# Patient Record
Sex: Male | Born: 1965
Health system: Southern US, Community
[De-identification: ages and names within clinical notes are randomized; demographics above are authoritative.]

## PROBLEM LIST (undated history)

## (undated) DIAGNOSIS — I10 Essential (primary) hypertension: Secondary | ICD-10-CM

## (undated) DIAGNOSIS — F32A Depression, unspecified: Secondary | ICD-10-CM

## (undated) DIAGNOSIS — M2550 Pain in unspecified joint: Secondary | ICD-10-CM

## (undated) DIAGNOSIS — G5601 Carpal tunnel syndrome, right upper limb: Secondary | ICD-10-CM

## (undated) DIAGNOSIS — K219 Gastro-esophageal reflux disease without esophagitis: Secondary | ICD-10-CM

## (undated) DIAGNOSIS — F1010 Alcohol abuse, uncomplicated: Secondary | ICD-10-CM

## (undated) DIAGNOSIS — J45909 Unspecified asthma, uncomplicated: Secondary | ICD-10-CM

## (undated) DIAGNOSIS — E7800 Pure hypercholesterolemia, unspecified: Secondary | ICD-10-CM

## (undated) DIAGNOSIS — G4730 Sleep apnea, unspecified: Secondary | ICD-10-CM

## (undated) DIAGNOSIS — Z9289 Personal history of other medical treatment: Secondary | ICD-10-CM

## (undated) DIAGNOSIS — E119 Type 2 diabetes mellitus without complications: Secondary | ICD-10-CM

## (undated) DIAGNOSIS — F419 Anxiety disorder, unspecified: Secondary | ICD-10-CM

## (undated) DIAGNOSIS — M7989 Other specified soft tissue disorders: Secondary | ICD-10-CM

## (undated) DIAGNOSIS — G4733 Obstructive sleep apnea (adult) (pediatric): Secondary | ICD-10-CM

## (undated) DIAGNOSIS — Z8719 Personal history of other diseases of the digestive system: Secondary | ICD-10-CM

## (undated) DIAGNOSIS — Z8709 Personal history of other diseases of the respiratory system: Secondary | ICD-10-CM

## (undated) HISTORY — DX: Sleep apnea, unspecified: G47.30

## (undated) HISTORY — DX: Alcohol abuse, uncomplicated: F10.10

## (undated) HISTORY — DX: Pain in unspecified joint: M25.50

## (undated) HISTORY — DX: Type 2 diabetes mellitus without complications: E11.9

## (undated) HISTORY — DX: Anxiety disorder, unspecified: F41.9

## (undated) HISTORY — DX: Other specified soft tissue disorders: M79.89

## (undated) HISTORY — DX: Depression, unspecified: F32.A

## (undated) HISTORY — DX: Unspecified asthma, uncomplicated: J45.909

## (undated) HISTORY — DX: Pure hypercholesterolemia, unspecified: E78.00

## (undated) HISTORY — PX: TONSILLECTOMY: SUR1361

---

## 2010-01-06 ENCOUNTER — Ambulatory Visit: Payer: Self-pay | Admitting: Cardiology

## 2010-01-06 ENCOUNTER — Inpatient Hospital Stay (HOSPITAL_COMMUNITY): Admission: EM | Admit: 2010-01-06 | Discharge: 2010-01-07 | Payer: Self-pay | Admitting: Emergency Medicine

## 2010-01-07 ENCOUNTER — Encounter: Payer: Self-pay | Admitting: Cardiology

## 2010-01-27 ENCOUNTER — Ambulatory Visit: Payer: Self-pay | Admitting: Internal Medicine

## 2010-01-27 DIAGNOSIS — R1319 Other dysphagia: Secondary | ICD-10-CM | POA: Insufficient documentation

## 2010-01-27 DIAGNOSIS — K219 Gastro-esophageal reflux disease without esophagitis: Secondary | ICD-10-CM | POA: Insufficient documentation

## 2010-02-09 ENCOUNTER — Telehealth (INDEPENDENT_AMBULATORY_CARE_PROVIDER_SITE_OTHER): Payer: Self-pay

## 2010-02-10 ENCOUNTER — Telehealth (INDEPENDENT_AMBULATORY_CARE_PROVIDER_SITE_OTHER): Payer: Self-pay

## 2010-02-19 ENCOUNTER — Ambulatory Visit (HOSPITAL_COMMUNITY): Admission: RE | Admit: 2010-02-19 | Discharge: 2010-02-19 | Payer: Self-pay | Admitting: Internal Medicine

## 2010-02-19 ENCOUNTER — Ambulatory Visit: Payer: Self-pay | Admitting: Internal Medicine

## 2010-06-30 NOTE — Progress Notes (Signed)
Summary: phone note/ cancelled EGD for now  Phone Note Call from Patient   Caller: Patient Summary of Call: Pt called to cancel his EGD that was scheduled for 02/16/2010, said he has too much going on at work now. Will call back to  schedule in October. Informed Jules Schick in Endo. Initial call taken by: Cloria Spring LPN,  February 09, 2010 4:32 PM

## 2010-06-30 NOTE — Progress Notes (Signed)
Summary: pt rescheduled EGD  Phone Note Call from Patient   Caller: Patient Summary of Call: Pt called back, rescheduled his EGD for 02/19/2010 @ 1:30, to be at hospital at 12:30. Debbie at Endo aware. Initial call taken by: Cloria Spring LPN,  February 10, 2010 2:52 PM

## 2010-06-30 NOTE — Assessment & Plan Note (Signed)
Summary: HOSP F/U REFLUX/LAW   Visit Type:  Initial Visit Primary Care Provider:  [place]  Chief Complaint:  F/U hospital.  History of Present Illness: 45 year old gentleman sent over by Dr. Molly Maduro up in [place] to further evaluate recent intermittent atypical chest pain. Had a bout of rather severe retrosternal chest discomfort about  2 weeks ago for which she came to the hospital and was admitted.  Seen by Dr. Simona Huh with cardiology. He had some difficulty swallowing during this episode. He came to the hospital where he ruled out for an MI. Ultimately, it was felt that his chest pain was not coming from coronary artery disease. Some l chest wall/ shoulder pain along the way.  He apparently did have an exercise echo done. He has a history of GERD -  has been on a variety of proton pump inhibitors over the past couple years with variable success in controlling symptoms.  He does not have too much in the way of typical reflux symptoms such as  heartburn or regurgitation. He have some vague intermittent esophageal dysphagia from time to time but only fleeting. He denies nausea vomiting melena or hematochezia. No history of GI neoplasia in the family.  He denies tobacco or alcohol.  Current Medications (verified): 1)  Nexium 40 Mg Cpdr (Esomeprazole Magnesium) .... Take 1 Tablet By Mouth Once A Day 2)  Wellbutrin Sr 200 Mg Xr12h-Tab (Bupropion Hcl) .... Take 1 Tablet By Mouth Two Times A Day 3)  Multi-Vitamin .... Take 1 Tablet By Mouth Once A Day 4)  Fish Oil 1500 .... Take 1 Tablet By Mouth Once A Day 5)  Glucosamine .... Take 1 Tablet By Mouth Once A Day  Allergies (verified): 1)  ! Augmentin  Past History:  Family History: Last updated: 01/27/2010 Father: Living  age 105  diabetic  hypertension Mother: Living age 63   healthy Siblings: two sisters   healthy  Social History: Last updated: 01/27/2010 Marital Status: Married Children: 3 Occupation: Surveyor, minerals Heavy trucks and  equipment  Past Medical History:  GERD ANXIETY/DEPRESSION  Past Surgical History: NONE  Family History: Father: Living  age 51  diabetic  hypertension Mother: Living age 99   healthy Siblings: two sisters   healthy  Social History: Marital Status: Married Children: 3 Occupation: Surveyor, minerals Heavy trucks and equipment  Vital Signs:  Patient profile:   45 year old male Height:      71 inches Weight:      240.50 pounds BMI:     33.66 Temp:     98.6 degrees F oral Pulse rate:   80 / minute BP sitting:   126 / 88  (left arm) Cuff size:   regular  Vitals Entered By: Cloria Spring LPN (January 27, 2010 2:57 PM)  Physical Exam  General:  alert conversant no acute distress. Accompanied by  his wife Chest Wall:  slight tenderness to left low lower anterior chest wall to palpation Abdomen:  nondistended positive bowel sounds soft nontender and appreciable mass or organomegaly  Impression & Recommendations: Impression:  Very pleasant 45 year old gentleman with a history of gastroesophageal reflux disease and recent atypical chest pain not felt to be cardiac in origin. It seems that he does have some varying  degrees of chest pain off and on which have worsened over the past couple months. Certainly, not typical for GERD. He does describe transient esophageal dysphagia. He may have a pre-existing esophageal motility disorder. There may also be musculoskeletal component. Symptoms would be atypical  for peptic ulcer disease.  He is has never had his upper GI tract evaluated.  Recommendations: Diagnostic EGD in the very near future. Risks, benefits, limitations, alternatives and imponderables have been reviewed;  questions have been answered. He is agreeable. We'll make further recommendations after EGD has been performed.  I'd like to  thank Dr. Molly Maduro for his kind referral.  Appended Document: Orders Update    Clinical Lists Changes  Problems: Added new problem of DYSPHAGIA  (ICD-787.29) Added new problem of GERD (ICD-530.81) Orders: Added new Service order of Est. Patient Level III (09604) - Signed

## 2010-06-30 NOTE — Letter (Signed)
Summary: EGD ORDER  EGD ORDER   Imported By: Ave Filter 01/27/2010 15:38:04  _____________________________________________________________________  External Attachment:    Type:   Image     Comment:   External Document

## 2010-08-10 ENCOUNTER — Ambulatory Visit: Payer: 59 | Attending: Family Medicine

## 2010-08-10 DIAGNOSIS — G4733 Obstructive sleep apnea (adult) (pediatric): Secondary | ICD-10-CM | POA: Insufficient documentation

## 2010-08-10 DIAGNOSIS — R0989 Other specified symptoms and signs involving the circulatory and respiratory systems: Secondary | ICD-10-CM | POA: Insufficient documentation

## 2010-08-10 DIAGNOSIS — R5381 Other malaise: Secondary | ICD-10-CM | POA: Insufficient documentation

## 2010-08-10 DIAGNOSIS — R0609 Other forms of dyspnea: Secondary | ICD-10-CM | POA: Insufficient documentation

## 2010-08-10 DIAGNOSIS — R5383 Other fatigue: Secondary | ICD-10-CM | POA: Insufficient documentation

## 2010-08-12 ENCOUNTER — Encounter (INDEPENDENT_AMBULATORY_CARE_PROVIDER_SITE_OTHER): Payer: Self-pay | Admitting: *Deleted

## 2010-08-14 LAB — CBC
HCT: 42.3 % (ref 39.0–52.0)
Hemoglobin: 14.3 g/dL (ref 13.0–17.0)
MCH: 31.6 pg (ref 26.0–34.0)
MCH: 31.9 pg (ref 26.0–34.0)
MCHC: 34 g/dL (ref 30.0–36.0)
MCHC: 34.5 g/dL (ref 30.0–36.0)
MCV: 92.4 fL (ref 78.0–100.0)
Platelets: 207 10*3/uL (ref 150–400)
RBC: 4.58 MIL/uL (ref 4.22–5.81)
RDW: 12.4 % (ref 11.5–15.5)
WBC: 7.3 10*3/uL (ref 4.0–10.5)
WBC: 7.8 10*3/uL (ref 4.0–10.5)

## 2010-08-14 LAB — DIFFERENTIAL
Basophils Absolute: 0 10*3/uL (ref 0.0–0.1)
Basophils Relative: 1 % (ref 0–1)
Eosinophils Absolute: 0.2 10*3/uL (ref 0.0–0.7)
Eosinophils Relative: 3 % (ref 0–5)
Lymphs Abs: 2.2 10*3/uL (ref 0.7–4.0)
Monocytes Relative: 11 % (ref 3–12)
Monocytes Relative: 13 % — ABNORMAL HIGH (ref 3–12)
Neutrophils Relative %: 51 % (ref 43–77)

## 2010-08-14 LAB — BASIC METABOLIC PANEL
CO2: 26 mEq/L (ref 19–32)
GFR calc Af Amer: >60 mL/min (ref >60)

## 2010-08-14 LAB — COMPREHENSIVE METABOLIC PANEL
ALT: 49 U/L (ref 0–53)
AST: 30 U/L (ref 0–37)
Albumin: 4 g/dL (ref 3.5–5.2)
Alkaline Phosphatase: 72 U/L (ref 39–117)
BUN: 14 mg/dL (ref 6–23)
CO2: 26 mEq/L (ref 19–32)
Chloride: 106 mEq/L (ref 96–112)
Creatinine, Ser: 1 mg/dL (ref 0.4–1.5)
GFR calc Af Amer: >60 mL/min (ref >60)
GFR calc non Af Amer: >60 mL/min (ref >60)
Glucose, Bld: 117 mg/dL — ABNORMAL HIGH (ref 70–99)
Potassium: 3.8 mEq/L (ref 3.5–5.1)
Sodium: 139 mEq/L (ref 135–145)
Total Bilirubin: 0.4 mg/dL (ref 0.3–1.2)
Total Protein: 6.3 g/dL (ref 6.0–8.3)

## 2010-08-14 LAB — CARDIAC PANEL(CRET KIN+CKTOT+MB+TROPI)
CK, MB: 2.3 ng/mL (ref 0.3–4.0)
CK, MB: 2.5 ng/mL (ref 0.3–4.0)
CK, MB: 3 ng/mL (ref 0.3–4.0)
Relative Index: 2 (ref 0.0–2.5)
Total CK: 122 U/L (ref 7–232)
Total CK: 134 U/L (ref 7–232)
Troponin I: 0.01 ng/mL (ref 0.00–0.06)

## 2010-08-14 LAB — LIPID PANEL
Cholesterol: 168 mg/dL (ref 0–200)
Total CHOL/HDL Ratio: 5.1 RATIO
Triglycerides: 143 mg/dL (ref <150)
VLDL: 29 mg/dL (ref 0–40)

## 2010-08-14 LAB — POCT CARDIAC MARKERS

## 2010-08-18 NOTE — Letter (Signed)
Summary: Recall Office Visit  [facility]  [address]   [place], [place] [address]   Phone: [phone]  Fax: [phone]      August 12, 2010   [facility] [address] [place], [place]  [address] Oct 15, 1965   Dear Christopher Bowers,   According to our records, it is time for you to schedule a follow-up office visit with [place].   At your convenience, please call [phone] to schedule an office visit. If you have any questions, concerns, or feel that this letter is in error, we would appreciate your call.   Sincerely,    Diana Eves  [facility] Ph: [phone]   Fax: [phone]

## 2010-08-24 ENCOUNTER — Ambulatory Visit: Payer: 59 | Attending: Family Medicine

## 2010-09-15 ENCOUNTER — Ambulatory Visit: Payer: 59 | Attending: Family Medicine

## 2010-09-15 DIAGNOSIS — G4733 Obstructive sleep apnea (adult) (pediatric): Secondary | ICD-10-CM | POA: Insufficient documentation

## 2010-09-20 NOTE — Procedures (Signed)
[name], Christopher Bowers                  ACCOUNT NO.:  [id number]  MEDICAL RECORD NO.:  [id number]          PATIENT TYPE:  OUT  LOCATION:  SLEEP LAB                     FACILITY:  [facility]  PHYSICIAN:  Imogen Maddalena A. Gerilyn Pilgrim, M.D. DATE OF BIRTH:  13-Feb-1966  DATE OF STUDY:  09/15/2010                           NOCTURNAL POLYSOMNOGRAM  REFERRING PHYSICIAN:  Carlena Bjornstad [place]  REFERRING PHYSICIAN:  Carlena Bjornstad [place].  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  The patient was tried on conventional CPAP and BiPAP, but this was not successfully self-referred for servo-ventilation.  INDICATION FOR STUDY:  EPWORTH SLEEPINESS SCORE:  MEDICATIONS:  Benadryl.  EPWORTH SLEEPINESS SCALE: 1. BMI 32.  ARCHITECTURAL SUMMARY:  Total recording time is 411 minutes.  Sleep efficiency 65%, sleep latency 30 minutes.  REM latency 319 minutes. Stage N1 27%, N2 40%, N3 80%, and REM sleep 14%.  RESPIRATORY SUMMARY:  The patient was titrated on servo-ventilation using Respironics auto SV.  The following are the optimal pressures, maximum pressure of 25 cm water.  Maximum of EPAP 25 cm of water. Minimum EPAP 11 cm of water.  Maximum pressure support 30 cm of water. Minimum pressure support 0 cm of water.  Bi-Flex/2 with the rate set on auto.  LIMB MOVEMENT SUMMARY:  PLM index is 0.  ELECTROCARDIOGRAM SUMMARY:  Average heart rate 69 with no significant dysrhythmias observed.  IMPRESSION:  Complex sleep apnea syndrome which responded well to servo ventilation at optimal pressures using a Respironics auto SV with the following pressures, maximum pressure support 25 cm of water, maximum EPAP 25 cm of water, minimum EPAP 11 cm of water.  Maximum pressure support 13 cm of water, minimum suppression support is 0.  Bi-Flex/2 and the respiratory rate set on  automatic.  SLEEP ARCHITECTURE:  RESPIRATORY DATA:  OXYGEN DATA:  CARDIAC DATA:  MOVEMENT-PARASOMNIA:  IMPRESSIONS-RECOMMENDATIONS:     Alroy Portela A. Gerilyn Pilgrim, M.D. Electronically Signed 09/20/2010 21:20:28    KAD/MEDQ  D:  09/19/2010 21:14:19  T:  09/20/2010 01:11:00  Job:  [id number]

## 2012-07-07 ENCOUNTER — Other Ambulatory Visit: Payer: Self-pay | Admitting: Family Medicine

## 2012-07-07 ENCOUNTER — Ambulatory Visit (HOSPITAL_COMMUNITY)
Admission: RE | Admit: 2012-07-07 | Discharge: 2012-07-07 | Disposition: A | Discharge status: Home / Self Care | Payer: 59 | Source: Ambulatory Visit | Attending: Family Medicine | Admitting: Family Medicine

## 2012-07-07 DIAGNOSIS — R509 Fever, unspecified: Secondary | ICD-10-CM

## 2012-07-08 ENCOUNTER — Encounter (HOSPITAL_COMMUNITY): Payer: Self-pay

## 2012-07-08 ENCOUNTER — Emergency Department (HOSPITAL_COMMUNITY): Payer: 59

## 2012-07-08 ENCOUNTER — Inpatient Hospital Stay (HOSPITAL_COMMUNITY)
Admission: EM | Admit: 2012-07-08 | Discharge: 2012-07-19 | DRG: 207 | Disposition: A | Discharge status: Home / Self Care | Payer: 59 | Attending: Internal Medicine | Admitting: Internal Medicine

## 2012-07-08 DIAGNOSIS — J111 Influenza due to unidentified influenza virus with other respiratory manifestations: Secondary | ICD-10-CM

## 2012-07-08 DIAGNOSIS — J9600 Acute respiratory failure, unspecified whether with hypoxia or hypercapnia: Secondary | ICD-10-CM

## 2012-07-08 DIAGNOSIS — R1319 Other dysphagia: Secondary | ICD-10-CM

## 2012-07-08 DIAGNOSIS — K5900 Constipation, unspecified: Secondary | ICD-10-CM | POA: Diagnosis not present

## 2012-07-08 DIAGNOSIS — G4733 Obstructive sleep apnea (adult) (pediatric): Secondary | ICD-10-CM | POA: Diagnosis present

## 2012-07-08 DIAGNOSIS — I10 Essential (primary) hypertension: Secondary | ICD-10-CM

## 2012-07-08 DIAGNOSIS — G4700 Insomnia, unspecified: Secondary | ICD-10-CM | POA: Diagnosis not present

## 2012-07-08 DIAGNOSIS — E669 Obesity, unspecified: Secondary | ICD-10-CM

## 2012-07-08 DIAGNOSIS — D696 Thrombocytopenia, unspecified: Secondary | ICD-10-CM | POA: Diagnosis present

## 2012-07-08 DIAGNOSIS — Z79899 Other long term (current) drug therapy: Secondary | ICD-10-CM

## 2012-07-08 DIAGNOSIS — F411 Generalized anxiety disorder: Secondary | ICD-10-CM | POA: Diagnosis present

## 2012-07-08 DIAGNOSIS — J1100 Influenza due to unidentified influenza virus with unspecified type of pneumonia: Principal | ICD-10-CM | POA: Diagnosis present

## 2012-07-08 DIAGNOSIS — R55 Syncope and collapse: Secondary | ICD-10-CM | POA: Diagnosis present

## 2012-07-08 DIAGNOSIS — Z6834 Body mass index (BMI) 34.0-34.9, adult: Secondary | ICD-10-CM

## 2012-07-08 DIAGNOSIS — J189 Pneumonia, unspecified organism: Secondary | ICD-10-CM

## 2012-07-08 DIAGNOSIS — J101 Influenza due to other identified influenza virus with other respiratory manifestations: Secondary | ICD-10-CM

## 2012-07-08 DIAGNOSIS — K219 Gastro-esophageal reflux disease without esophagitis: Secondary | ICD-10-CM

## 2012-07-08 DIAGNOSIS — E871 Hypo-osmolality and hyponatremia: Secondary | ICD-10-CM | POA: Diagnosis not present

## 2012-07-08 DIAGNOSIS — R5381 Other malaise: Secondary | ICD-10-CM | POA: Diagnosis not present

## 2012-07-08 DIAGNOSIS — J80 Acute respiratory distress syndrome: Secondary | ICD-10-CM

## 2012-07-08 HISTORY — DX: Essential (primary) hypertension: I10

## 2012-07-08 HISTORY — DX: Obstructive sleep apnea (adult) (pediatric): G47.33

## 2012-07-08 LAB — URINALYSIS, ROUTINE W REFLEX MICROSCOPIC
Glucose, UA: NEGATIVE mg/dL
Leukocytes, UA: NEGATIVE
Protein, ur: 100 mg/dL — AB
Specific Gravity, Urine: >1.03 — ABNORMAL HIGH (ref 1.005–1.030)
Urobilinogen, UA: 1 mg/dL (ref 0.0–1.0)

## 2012-07-08 LAB — COMPREHENSIVE METABOLIC PANEL
AST: 53 U/L — ABNORMAL HIGH (ref 0–37)
Albumin: 2.9 g/dL — ABNORMAL LOW (ref 3.5–5.2)
BUN: 11 mg/dL (ref 6–23)
Calcium: 8.3 mg/dL — ABNORMAL LOW (ref 8.4–10.5)
Chloride: 100 mEq/L (ref 96–112)
Creatinine, Ser: 0.95 mg/dL (ref 0.50–1.35)
Total Bilirubin: 0.2 mg/dL — ABNORMAL LOW (ref 0.3–1.2)

## 2012-07-08 LAB — BLOOD GAS, ARTERIAL
Acid-base deficit: 2.8 mmol/L — ABNORMAL HIGH (ref 0.0–2.0)
Acid-base deficit: 2.3 mmol/L — ABNORMAL HIGH (ref 0.0–2.0)
Bicarbonate: 21.5 mEq/L (ref 20.0–24.0)
Bicarbonate: 22 mEq/L (ref 20.0–24.0)
TCO2: 19 mmol/L (ref 0–100)
TCO2: 19.6 mmol/L (ref 0–100)
pCO2 arterial: 38.6 mmHg (ref 35.0–45.0)
pCO2 arterial: 38.1 mmHg (ref 35.0–45.0)
pH, Arterial: 7.379 (ref 7.350–7.450)
pO2, Arterial: 111 mmHg — ABNORMAL HIGH (ref 80.0–100.0)

## 2012-07-08 LAB — LACTIC ACID, PLASMA: Lactic Acid, Venous: 1 mmol/L (ref 0.5–2.2)

## 2012-07-08 LAB — CBC WITH DIFFERENTIAL/PLATELET
Eosinophils Relative: 0 % (ref 0–5)
HCT: 41.1 % (ref 39.0–52.0)
Hemoglobin: 13.9 g/dL (ref 13.0–17.0)
Lymphocytes Relative: 7 % — ABNORMAL LOW (ref 12–46)
Lymphs Abs: 0.3 10*3/uL — ABNORMAL LOW (ref 0.7–4.0)
MCV: 90.1 fL (ref 78.0–100.0)
Monocytes Absolute: 0.1 10*3/uL (ref 0.1–1.0)
Monocytes Relative: 4 % (ref 3–12)
Neutro Abs: 3.3 10*3/uL (ref 1.7–7.7)
RBC: 4.56 MIL/uL (ref 4.22–5.81)
RDW: 12.6 % (ref 11.5–15.5)
WBC: 3.6 10*3/uL — ABNORMAL LOW (ref 4.0–10.5)

## 2012-07-08 LAB — INFLUENZA PANEL BY PCR (TYPE A & B): Influenza B By PCR: NEGATIVE

## 2012-07-08 LAB — URINE MICROSCOPIC-ADD ON

## 2012-07-08 LAB — HIV ANTIBODY (ROUTINE TESTING W REFLEX): HIV: NONREACTIVE

## 2012-07-08 MED ORDER — ONDANSETRON 4 MG PO TBDP
ORAL_TABLET | ORAL | Status: AC
  Administered 2012-07-08: 4 mg via ORAL
  Filled 2012-07-08: qty 1

## 2012-07-08 MED ORDER — DEXTROSE 5 % IV SOLN
1.0000 g | INTRAVENOUS | Status: DC
  Administered 2012-07-08 – 2012-07-09 (×2): 1 g via INTRAVENOUS
  Filled 2012-07-08 (×3): qty 10

## 2012-07-08 MED ORDER — ALBUTEROL SULFATE (5 MG/ML) 0.5% IN NEBU
2.5000 mg | INHALATION_SOLUTION | Freq: Once | RESPIRATORY_TRACT | Status: AC
  Administered 2012-07-08: 2.5 mg via RESPIRATORY_TRACT
  Filled 2012-07-08: qty 0.5

## 2012-07-08 MED ORDER — GUAIFENESIN-CODEINE 100-10 MG/5ML PO SOLN
10.0000 mL | Freq: Once | ORAL | Status: AC
  Administered 2012-07-08: 10 mL via ORAL
  Filled 2012-07-08: qty 10

## 2012-07-08 MED ORDER — HEPARIN SODIUM (PORCINE) 5000 UNIT/ML IJ SOLN
5000.0000 [IU] | Freq: Three times a day (TID) | INTRAMUSCULAR | Status: DC
  Administered 2012-07-08 – 2012-07-09 (×3): 5000 [IU] via SUBCUTANEOUS
  Filled 2012-07-08 (×7): qty 1

## 2012-07-08 MED ORDER — VANCOMYCIN HCL IN DEXTROSE 1-5 GM/200ML-% IV SOLN
1000.0000 mg | Freq: Two times a day (BID) | INTRAVENOUS | Status: DC
  Administered 2012-07-09 – 2012-07-11 (×5): 1000 mg via INTRAVENOUS
  Filled 2012-07-08 (×7): qty 200

## 2012-07-08 MED ORDER — OSELTAMIVIR PHOSPHATE 75 MG PO CAPS
75.0000 mg | ORAL_CAPSULE | Freq: Two times a day (BID) | ORAL | Status: DC
  Administered 2012-07-08: 75 mg via ORAL
  Filled 2012-07-08: qty 1

## 2012-07-08 MED ORDER — OXYCODONE HCL 5 MG PO TABS
5.0000 mg | ORAL_TABLET | ORAL | Status: DC | PRN
  Administered 2012-07-08 – 2012-07-09 (×4): 5 mg via ORAL
  Filled 2012-07-08 (×4): qty 1

## 2012-07-08 MED ORDER — DEXTROSE 5 % IV SOLN
500.0000 mg | INTRAVENOUS | Status: DC
  Administered 2012-07-08: 500 mg via INTRAVENOUS
  Filled 2012-07-08 (×2): qty 500

## 2012-07-08 MED ORDER — ACETAMINOPHEN 325 MG PO TABS
650.0000 mg | ORAL_TABLET | Freq: Four times a day (QID) | ORAL | Status: DC | PRN
  Administered 2012-07-08 – 2012-07-09 (×2): 650 mg via ORAL
  Filled 2012-07-08 (×2): qty 2

## 2012-07-08 MED ORDER — SODIUM CHLORIDE 0.9 % IV SOLN
1000.0000 mL | Freq: Once | INTRAVENOUS | Status: AC
  Administered 2012-07-08: 1000 mL via INTRAVENOUS

## 2012-07-08 MED ORDER — GUAIFENESIN-CODEINE 100-10 MG/5ML PO SOLN
10.0000 mL | ORAL | Status: DC | PRN
  Administered 2012-07-08: 10 mL via ORAL
  Filled 2012-07-08: qty 10

## 2012-07-08 MED ORDER — HYDROCODONE-ACETAMINOPHEN 5-325 MG PO TABS
1.0000 | ORAL_TABLET | Freq: Once | ORAL | Status: AC
  Administered 2012-07-08: 1 via ORAL
  Filled 2012-07-08: qty 1

## 2012-07-08 MED ORDER — SODIUM CHLORIDE 0.9 % IV SOLN
1000.0000 mL | INTRAVENOUS | Status: DC
  Administered 2012-07-08: 1000 mL via INTRAVENOUS

## 2012-07-08 MED ORDER — IPRATROPIUM BROMIDE 0.02 % IN SOLN
0.5000 mg | Freq: Once | RESPIRATORY_TRACT | Status: AC
  Administered 2012-07-08: 0.5 mg via RESPIRATORY_TRACT
  Filled 2012-07-08: qty 2.5

## 2012-07-08 MED ORDER — IPRATROPIUM BROMIDE 0.02 % IN SOLN
0.5000 mg | RESPIRATORY_TRACT | Status: DC
  Administered 2012-07-08 – 2012-07-09 (×4): 0.5 mg via RESPIRATORY_TRACT
  Filled 2012-07-08 (×4): qty 2.5

## 2012-07-08 MED ORDER — VANCOMYCIN HCL IN DEXTROSE 1-5 GM/200ML-% IV SOLN
INTRAVENOUS | Status: AC
  Filled 2012-07-08: qty 200

## 2012-07-08 MED ORDER — ONDANSETRON 4 MG PO TBDP
4.0000 mg | ORAL_TABLET | Freq: Once | ORAL | Status: AC
  Administered 2012-07-08: 4 mg via ORAL

## 2012-07-08 MED ORDER — DEXTROSE 5 % IV SOLN
500.0000 mg | INTRAVENOUS | Status: DC
  Filled 2012-07-08 (×2): qty 500

## 2012-07-08 MED ORDER — ALBUTEROL SULFATE (5 MG/ML) 0.5% IN NEBU
2.5000 mg | INHALATION_SOLUTION | RESPIRATORY_TRACT | Status: DC
  Administered 2012-07-08 – 2012-07-09 (×4): 2.5 mg via RESPIRATORY_TRACT
  Filled 2012-07-08 (×4): qty 0.5

## 2012-07-08 MED ORDER — VANCOMYCIN HCL IN DEXTROSE 1-5 GM/200ML-% IV SOLN
1000.0000 mg | INTRAVENOUS | Status: AC
  Administered 2012-07-08: 1000 mg via INTRAVENOUS
  Filled 2012-07-08 (×2): qty 200

## 2012-07-08 MED ORDER — ALBUTEROL SULFATE (5 MG/ML) 0.5% IN NEBU
2.5000 mg | INHALATION_SOLUTION | RESPIRATORY_TRACT | Status: DC | PRN
  Administered 2012-07-08: 2.5 mg via RESPIRATORY_TRACT
  Filled 2012-07-08: qty 0.5

## 2012-07-08 NOTE — Progress Notes (Signed)
ANTIBIOTIC CONSULT NOTE - INITIAL  Pharmacy Consult for Vancomycin Indication: pneumonia  Allergies  Allergen Reactions  . Amoxicillin-Pot Clavulanate Rash    Patient Measurements: Height: 5\' 11"  (180.3 cm) Weight: 245 lb (111.131 kg) IBW/kg (Calculated) : 75.3  Vital Signs: Temp: 102.6 F (39.2 C) (02/08 1630) Temp src: Oral (02/08 1630) BP: 146/97 mmHg (02/08 1700) Pulse Rate: 106 (02/08 1700) Intake/Output from previous day:   Intake/Output from this shift: Total I/O In: 589.6 [I.V.:339.6; IV Piggyback:250] Out: -   Labs:  Recent Labs  07/08/12 1325  WBC 3.6*  HGB 13.9  PLT 116*  CREATININE 0.95   Estimated Creatinine Clearance: 123.1 ml/min (by C-G formula based on Cr of 0.95). No results found for this basename: VANCOTROUGH, VANCOPEAK, VANCORANDOM, GENTTROUGH, GENTPEAK, GENTRANDOM, TOBRATROUGH, TOBRAPEAK, TOBRARND, AMIKACINPEAK, AMIKACINTROU, AMIKACIN,  in the last 72 hours   Microbiology: Recent Results (from the past 720 hour(s))  CULTURE, BLOOD (ROUTINE X 2)     Status: None   Collection Time    07/08/12  1:26 PM      Result Value Range Status   Specimen Description BLOOD LEFT FOREARM   Final   Special Requests BOTTLES DRAWN AEROBIC AND ANAEROBIC 4CC   Final   Culture PENDING   Incomplete   Report Status PENDING   Incomplete    Medical History: Past Medical History  Diagnosis Date  . Hypertension   . OSA (obstructive sleep apnea)   . Acid reflux     Medications:  Scheduled:  . [COMPLETED] sodium chloride  1,000 mL Intravenous Once  . [COMPLETED] albuterol  2.5 mg Nebulization Once  . albuterol  2.5 mg Nebulization Q4H  . azithromycin  500 mg Intravenous Q24H  . cefTRIAXone (ROCEPHIN)  IV  1 g Intravenous Q24H  . [COMPLETED] guaiFENesin-codeine  10 mL Oral Once  . heparin  5,000 Units Subcutaneous Q8H  . [COMPLETED] HYDROcodone-acetaminophen  1 tablet Oral Once  . [COMPLETED] ipratropium  0.5 mg Nebulization Once  . ipratropium  0.5 mg  Nebulization Q4H  . [COMPLETED] ondansetron  4 mg Oral Once  . oseltamivir  75 mg Oral BID  . [DISCONTINUED] azithromycin  500 mg Intravenous Q24H   Assessment: 47 yo obese M with flu and subsequent bilateral PNA which worsened on outpatient course of Levaquin.   Admitted for IV antibiotics with Rocephin, Zithromax & PNA. Renal function is at baseline.   Goal of Therapy:  Vancomycin trough level 15-20 mcg/ml  Plan:  1) Vancomycin 2gm IV load then 1gm IV q12h 2) Continue Rocephin & Zithromax per MD 3) Check Vancomycin trough at steady state 4) Monitor renal function and cx data 5) Duration of therapy per MD; anticipate 7-10 days of antibiotics  Elson Clan 07/08/2012,5:26 PM

## 2012-07-08 NOTE — ED Notes (Signed)
Requests medication for pain and medication for cough.

## 2012-07-08 NOTE — Progress Notes (Signed)
Patient has Oxygen saturations that started dropping. ABG drew, BiPAP started. Elink and attending MD notified. Attending to discuss with [facility] Physician about possible transfer to higher level of care.

## 2012-07-08 NOTE — H&P (Signed)
[facility] History and Physical  Christopher Bowers VHQ:[id number] DOB: Jun 23, 1965 DOA: 07/08/2012  Referring physician: Dr. Ignacia Palma, ER physician. PCP: Lilyan Punt, MD    Chief Complaint: Dyspnea, productive cough and fever.  HPI: Christopher Bowers is a 47 y.o. male who presents with dyspnea, productive cough and fever for the last 5 days or so. He started to initially have chills, fever and body aches as if he had influenza. Eventually he went to see his primary care physician a couple of days ago and was diagnosed with clinical flu. He was also started on Levaquin and he has had 2 doses of that. Unfortunately his breathing is gotten worse in the last 24 hours and he does not feel he is improving and therefore presented to the emergency room. He is a nonsmoker. He is saturating 95% on 2 L of oxygen. He has had poor by mouth intake, especially fluids. His chest x-ray is very abnormal and we are requested to see him for admission.   Review of Systems: Apart from history of present illness, other systems negative.  Past Medical History  Diagnosis Date  . Hypertension   . OSA (obstructive sleep apnea)   . Acid reflux    History reviewed. No pertinent past surgical history. Social History:  He is married and lives with his wife. He does not smoke, does  drink alcohol. He is in Airline pilot.  Allergies  Allergen Reactions  . Amoxicillin-Pot Clavulanate Rash    No family history on file.  In particular no family history of immunocompromise diseases.  Prior to Admission medications   Not on File   Physical Exam: Filed Vitals:   07/08/12 1355 07/08/12 1415 07/08/12 1431 07/08/12 1443  BP:  136/90  123/80  Pulse: 93 94 95 97  Temp:      TempSrc:      Resp: 30 24  22   SpO2: 94% 95% 93% 97%     General:  He looks sick, clinically dehydrated. There does not appear to be any central or peripheral cyanosis. He is not toxic clinically.  Eyes: No pallor. No jaundice.  ENT: No major  abnormalities.  Neck: No lymphadenopathy.  Cardiovascular: Heart sounds are present without murmurs or gallop rhythm. He is in sinus rhythm.  Respiratory: Bilateral crackles throughout both lung fields, currently no wheezing although he did have nebulizer treatment recently. There is no bronchial breathing.  Abdomen: Soft, nontender. No masses.  Skin: No rash.  Musculoskeletal: No major joint abnormalities.  Psychiatric: Appropriate affect.  Neurologic: Alert, orientated, no focal neurological signs.  Labs on Admission:  Basic Metabolic Panel:  Recent Labs Lab 07/08/12 1325  NA 133*  K 3.8  CL 100  CO2 25  GLUCOSE 157*  BUN 11  CREATININE 0.95  CALCIUM 8.3*   Liver Function Tests:  Recent Labs Lab 07/08/12 1325  AST 53*  ALT 44  ALKPHOS 73  BILITOT 0.2*  PROT 5.9*  ALBUMIN 2.9*     CBC:  Recent Labs Lab 07/08/12 1325  WBC 3.6*  NEUTROABS 3.3  HGB 13.9  HCT 41.1  MCV 90.1  PLT 116*     Radiological Exams on Admission: Dg Chest 2 View  07/08/2012  *RADIOLOGY REPORT*  Clinical Data: Shortness of breath, and chest pain  CHEST - 2 VIEW  Comparison: Chest x-ray of 07/07/2012  Findings: There has been an increase in patchy airspace disease bilaterally most consistent with worsening of multifocal pneumonia. Cardiomegaly is stable.  The lungs are poorly aerated.  No skeletal abnormality is seen.  IMPRESSION: Worsening of patchy airspace disease bilaterally most consistent with pneumonia.   Original Report Authenticated By: Dwyane Dee, M.D.    Dg Chest 2 View  07/07/2012  *RADIOLOGY REPORT*  Clinical Data: Fever.  Nausea.  CHEST - 2 VIEW  Comparison: 01/06/2010  Findings: The patient has bilateral patchy pulmonary infiltrates with prominent peribronchial thickening.  No effusions.  Heart size and vascularity are normal.  No acute osseous abnormality.  IMPRESSION: Bilateral pneumonia.   Original Report Authenticated By: Francene Boyers, M.D.     EKG:  Independently reviewed. Normal sinus rhythm, no acute ST-T wave changes.  Assessment/Plan   1. Bilateral community-acquired pneumonia with hypoxemia. Saturations above 90% on 2 L oxygen per minute. 2. Clinical influenza. 3. Hypertension. 4. Obesity.  Plan: 1. Admit to step down unit. 2. Intravenous antibiotics per pneumonia protocol. 3. Intravenous fluids. 4. Pulmonology consultation. I spoke with Dr. Juanetta Gosling already and he will come and see the patient today. 5. Closely monitor respiratory status and assist ventilation if required.   Code Status: Full code.  Family Communication: Discussed with patient at the bedside along with his wife.   Disposition Plan: Home when medically stable.  Time spent: 45 minutes.  Wilson Singer [facility] Pager [phone].  If 7PM-7AM, please contact night-coverage [email or web address] Password [email or web address] 07/08/2012, 3:07 PM

## 2012-07-08 NOTE — ED Provider Notes (Signed)
History     CSN: [id number]  Arrival date & time 07/08/12  1214   First MD Initiated Contact with Patient 07/08/12 1235      Chief Complaint  Patient presents with  . Shortness of Breath    (Consider location/radiation/quality/duration/timing/severity/associated sxs/prior treatment) HPI Comments: Patient is a 47 year old man  had the flu on Monday, 5 days ago. He seemed to get worse, and was seen by his family physician, Lilyan Punt, 2 days ago, who prescribed a Z-Pak. He continued to worsen, with fever, and was seen yesterday at that time Levaquin and Tamiflu were added. During the night he released and had temperature up to 102. He therefore called Dr. Gerda Diss, who referred him to [facility] ED for evaluation.  Patient is a 47 y.o. male presenting with cough. The history is provided by the patient and the spouse (Dr. Lilyan Punt). No language interpreter was used.  Cough Cough characteristics:  Productive Sputum characteristics: Pink-tinged sputum. Severity:  Severe Onset quality:  Gradual Duration:  5 days Timing:  Constant Progression:  Worsening Chronicity:  New Smoker: no   Relieved by:  Nothing Worsened by:  Nothing tried Ineffective treatments: He has take azithromycin, levofloxacin, and Tamiflu without relief. Associated symptoms: chills, fever, shortness of breath and wheezing   Fever:    Duration:  5 days   Timing:  Intermittent   Temp source:  Oral   Progression:  Worsening Shortness of breath:    Severity:  Moderate   Onset quality:  Gradual   Duration:  1 day   Timing:  Constant   Progression:  Worsening Wheezing:    Severity:  Moderate   Onset quality:  Gradual   Duration:  1 day   Timing:  Constant   Progression:  Worsening   Chronicity:  New   Past Medical History  Diagnosis Date  . Hypertension   . OSA (obstructive sleep apnea)   . Acid reflux     History reviewed. No pertinent past surgical history.  No family history on file.  History   Substance Use Topics  . Smoking status: Never Smoker   . Smokeless tobacco: Not on file  . Alcohol Use: No      Review of Systems  Constitutional: Positive for fever and chills.  HENT: Negative.   Eyes: Negative.   Respiratory: Positive for cough, shortness of breath and wheezing.   Cardiovascular: Negative.   Gastrointestinal: Negative.   Genitourinary: Negative.   Musculoskeletal: Negative.   Skin: Negative.   Allergic/Immunologic: Negative for immunocompromised state.  Neurological: Negative.   Psychiatric/Behavioral: Negative.     Allergies  Amoxicillin-pot clavulanate  Home Medications  No current outpatient prescriptions on file.  BP 136/82  Pulse 87  Temp(Src) 100.6 F (38.1 C) (Oral)  Resp 24  SpO2 93%  Physical Exam  Nursing note and vitals reviewed. Constitutional: He is oriented to person, place, and time. He appears well-developed and well-nourished.  In moderate distress with shortness of breath, cough.  HENT:  Head: Normocephalic and atraumatic.  Right Ear: External ear normal.  Left Ear: External ear normal.  Mouth/Throat: Oropharynx is clear and moist.  Eyes: Conjunctivae and EOM are normal. Pupils are equal, round, and reactive to light.  Neck: Normal range of motion. Neck supple.  Cardiovascular: Normal rate, regular rhythm and normal heart sounds.   Pulmonary/Chest:  Coarse rhonchi over both lung fields.  Abdominal: Soft. Bowel sounds are normal.  Musculoskeletal: Normal range of motion. He exhibits no edema and no  tenderness.  Neurological: He is alert and oriented to person, place, and time.  No sensory or motor discharge.  Skin: Skin is warm and dry.  Psychiatric: He has a normal mood and affect. His behavior is normal.    ED Course  Procedures (including critical care time)  Labs Reviewed - No data to display Dg Chest 2 View  07/07/2012  *RADIOLOGY REPORT*  Clinical Data: Fever.  Nausea.  CHEST - 2 VIEW  Comparison: 01/06/2010   Findings: The patient has bilateral patchy pulmonary infiltrates with prominent peribronchial thickening.  No effusions.  Heart size and vascularity are normal.  No acute osseous abnormality.  IMPRESSION: Bilateral pneumonia.   Original Report Authenticated By: Francene Boyers, M.D.    12:36 PM  Date: 07/08/2012  Rate: 91  Rhythm: normal sinus rhythm  QRS Axis: normal  Intervals: normal PQRS:  Left atrial abnormality.  Left ventricular hypertrophy.  ST/T Wave abnormalities: normal  Conduction Disutrbances:none  Narrative Interpretation: Abnormal EKG  Old EKG Reviewed: none available  12:47 PM Pt seen --> physical exam performed.  Lab workup ordered.  2:50 PM Results for orders placed during the hospital encounter of 07/08/12  CULTURE, BLOOD (ROUTINE X 2)      Result Value Range   Specimen Description BLOOD LEFT FOREARM     Special Requests BOTTLES DRAWN AEROBIC AND ANAEROBIC 4CC     Culture PENDING     Report Status PENDING    BLOOD GAS, ARTERIAL      Result Value Range   O2 Content 2.0     Delivery systems NASAL CANNULA     pH, Arterial 7.370  7.350 - 7.450   pCO2 arterial 38.6  35.0 - 45.0 mmHg   pO2, Arterial 74.4 (*) 80.0 - 100.0 mmHg   Bicarbonate 21.5  20.0 - 24.0 mEq/L   TCO2 19.0  0 - 100 mmol/L   Acid-base deficit 2.8 (*) 0.0 - 2.0 mmol/L   O2 Saturation 94.1     Patient temperature 38.1     Collection site LEFT RADIAL     Drawn by COLLECTED BY RT     Sample type ARTERIAL     Allens test (pass/fail) PASS  PASS  CBC WITH DIFFERENTIAL      Result Value Range   WBC 3.6 (*) 4.0 - 10.5 K/uL   RBC 4.56  4.22 - 5.81 MIL/uL   Hemoglobin 13.9  13.0 - 17.0 g/dL   HCT 96.0  45.4 - 09.8 %   MCV 90.1  78.0 - 100.0 fL   MCH 30.5  26.0 - 34.0 pg   MCHC 33.8  30.0 - 36.0 g/dL   RDW 11.9  14.7 - 82.9 %   Platelets 116 (*) 150 - 400 K/uL   Neutrophils Relative 89 (*) 43 - 77 %   Neutro Abs 3.3  1.7 - 7.7 K/uL   Lymphocytes Relative 7 (*) 12 - 46 %   Lymphs Abs 0.3 (*) 0.7  - 4.0 K/uL   Monocytes Relative 4  3 - 12 %   Monocytes Absolute 0.1  0.1 - 1.0 K/uL   Eosinophils Relative 0  0 - 5 %   Eosinophils Absolute 0.0  0.0 - 0.7 K/uL   Basophils Relative 0  0 - 1 %   Basophils Absolute 0.0  0.0 - 0.1 K/uL  COMPREHENSIVE METABOLIC PANEL      Result Value Range   Sodium 133 (*) 135 - 145 mEq/L   Potassium 3.8  3.5 -  5.1 mEq/L   Chloride 100  96 - 112 mEq/L   CO2 25  19 - 32 mEq/L   Glucose, Bld 157 (*) 70 - 99 mg/dL   BUN 11  6 - 23 mg/dL   Creatinine, Ser 9.60  0.50 - 1.35 mg/dL   Calcium 8.3 (*) 8.4 - 10.5 mg/dL   Total Protein 5.9 (*) 6.0 - 8.3 g/dL   Albumin 2.9 (*) 3.5 - 5.2 g/dL   AST 53 (*) 0 - 37 U/L   ALT 44  0 - 53 U/L   Alkaline Phosphatase 73  39 - 117 U/L   Total Bilirubin 0.2 (*) 0.3 - 1.2 mg/dL   GFR calc non Af Amer >90  >90 mL/min   GFR calc Af Amer >90  >90 mL/min  URINALYSIS, ROUTINE W REFLEX MICROSCOPIC      Result Value Range   Color, Urine YELLOW  YELLOW   APPearance CLEAR  CLEAR   Specific Gravity, Urine >1.030 (*) 1.005 - 1.030   pH 6.0  5.0 - 8.0   Glucose, UA NEGATIVE  NEGATIVE mg/dL   Hgb urine dipstick MODERATE (*) NEGATIVE   Bilirubin Urine NEGATIVE  NEGATIVE   Ketones, ur NEGATIVE  NEGATIVE mg/dL   Protein, ur 454 (*) NEGATIVE mg/dL   Urobilinogen, UA 1.0  0.0 - 1.0 mg/dL   Nitrite NEGATIVE  NEGATIVE   Leukocytes, UA NEGATIVE  NEGATIVE  LACTIC ACID, PLASMA      Result Value Range   Lactic Acid, Venous 1.0  0.5 - 2.2 mmol/L  URINE MICROSCOPIC-ADD ON      Result Value Range   RBC / HPF 3-6  <3 RBC/hpf   Dg Chest 2 View  07/08/2012  *RADIOLOGY REPORT*  Clinical Data: Shortness of breath, and chest pain  CHEST - 2 VIEW  Comparison: Chest x-ray of 07/07/2012  Findings: There has been an increase in patchy airspace disease bilaterally most consistent with worsening of multifocal pneumonia. Cardiomegaly is stable.  The lungs are poorly aerated.  No skeletal abnormality is seen.  IMPRESSION: Worsening of patchy airspace  disease bilaterally most consistent with pneumonia.   Original Report Authenticated By: Dwyane Dee, M.D.    Dg Chest 2 View  07/07/2012  *RADIOLOGY REPORT*  Clinical Data: Fever.  Nausea.  CHEST - 2 VIEW  Comparison: 01/06/2010  Findings: The patient has bilateral patchy pulmonary infiltrates with prominent peribronchial thickening.  No effusions.  Heart size and vascularity are normal.  No acute osseous abnormality.  IMPRESSION: Bilateral pneumonia.   Original Report Authenticated By: Francene Boyers, M.D.     2:50 PM Case discussed with Lilly Cove, M.D., [facility], who will see pt.   1. CAP (community acquired pneumonia)   2. HTN (hypertension)   3. Obesity          Carleene Cooper III, MD 07/08/12 [phone]

## 2012-07-08 NOTE — Consult Note (Signed)
Consult requested by: Dr. Karilyn Cota Consult requested for pneumonia:  HPI: This is a 47 year old who got sick about 5 days ago. He started off with shortness of breath productive cough and fever and also had chills and bodyaches. He eventually went to the physician on the sixth and was diagnosed with probable influenza but because he was pretty sick he was started on Levaquin. He went back to the primary care physician Dr. Gerda Diss and had a chest x-ray which showed bilateral pneumonia. Because he seems clinically stable he was continued on Levaquin and Zithromax was added. However he continued having problems with more shortness of breath and eventually came to the emergency room today and his x-ray looked worse than yesterday. He is admitted because of that. His white blood count was approximately 6000 yesterday and now is down to about 4000. His platelets were slightly low yesterday and today. His AST is slightly elevated which may be related to his pneumonia. He had an elevated ALT in January of 2011 but was back to normal in June of 2011. He says he's generally been active. He says he runs and occasionally has wheezing when he runs but no distinct history of asthma or any other lung problem. There is a family history of COPD. He has obstructive sleep apnea and admits he is noncompliant with CPAP  Past Medical History  Diagnosis Date  . Hypertension   . OSA (obstructive sleep apnea)   . Acid reflux      No family history on file.   History   Social History  . Marital Status: Married    Spouse Name: N/A    Number of Children: N/A  . Years of Education: N/A   Social History Main Topics  . Smoking status: Never Smoker   . Smokeless tobacco: None  . Alcohol Use: No  . Drug Use: No  . Sexually Active: None   Other Topics Concern  . None   Social History Narrative  . None     ROS: He says his abdomen is somewhat sore and he thinks it's related to cough and not eating. He is able to  cough up a little bit of sputum. He says he feels somewhat better after treatment in the emergency department.    Objective: Vital signs in last 24 hours: Temp:  [100.6 F (38.1 C)-102.6 F (39.2 C)] 102.6 F (39.2 C) (02/08 1630) Pulse Rate:  [87-100] 99 (02/08 1630) Resp:  [18-30] 28 (02/08 1630) BP: (117-141)/(68-90) 141/89 mmHg (02/08 1630) SpO2:  [88 %-99 %] 95 % (02/08 1630) Weight:  [111.131 kg (245 lb)] 111.131 kg (245 lb) (02/08 1640) Weight change:     Intake/Output from previous day:    PHYSICAL EXAM He is moderately obese. He does not appear to be in any acute distress. His pupils are reactive. His mucous membranes are slightly low. His neck is supple without masses. His chest shows some rales bilaterally and end expiratory wheezes. His heart is regular without gallop. His abdomen is soft without masses. Extremities showed no edema. Central nervous system examination is grossly intact  Lab Results: Basic Metabolic Panel:  Recent Labs  40/98/11 1325  NA 133*  K 3.8  CL 100  CO2 25  GLUCOSE 157*  BUN 11  CREATININE 0.95  CALCIUM 8.3*   Liver Function Tests:  Recent Labs  07/08/12 1325  AST 53*  ALT 44  ALKPHOS 73  BILITOT 0.2*  PROT 5.9*  ALBUMIN 2.9*   No results found  for this basename: LIPASE, AMYLASE,  in the last 72 hours No results found for this basename: AMMONIA,  in the last 72 hours CBC:  Recent Labs  07/08/12 1325  WBC 3.6*  NEUTROABS 3.3  HGB 13.9  HCT 41.1  MCV 90.1  PLT 116*   Cardiac Enzymes: No results found for this basename: CKTOTAL, CKMB, CKMBINDEX, TROPONINI,  in the last 72 hours BNP: No results found for this basename: PROBNP,  in the last 72 hours D-Dimer: No results found for this basename: DDIMER,  in the last 72 hours CBG: No results found for this basename: GLUCAP,  in the last 72 hours Hemoglobin A1C: No results found for this basename: HGBA1C,  in the last 72 hours Fasting Lipid Panel: No results found  for this basename: CHOL, HDL, LDLCALC, TRIG, CHOLHDL, LDLDIRECT,  in the last 72 hours Thyroid Function Tests: No results found for this basename: TSH, T4TOTAL, FREET4, T3FREE, THYROIDAB,  in the last 72 hours Anemia Panel: No results found for this basename: VITAMINB12, FOLATE, FERRITIN, TIBC, IRON, RETICCTPCT,  in the last 72 hours Coagulation: No results found for this basename: LABPROT, INR,  in the last 72 hours Urine Drug Screen: Drugs of Abuse  No results found for this basename: labopia, cocainscrnur, labbenz, amphetmu, thcu, labbarb    Alcohol Level: No results found for this basename: ETH,  in the last 72 hours Urinalysis:  Recent Labs  07/08/12 1306  COLORURINE YELLOW  LABSPEC >1.030*  PHURINE 6.0  GLUCOSEU NEGATIVE  HGBUR MODERATE*  BILIRUBINUR NEGATIVE  KETONESUR NEGATIVE  PROTEINUR 100*  UROBILINOGEN 1.0  NITRITE NEGATIVE  LEUKOCYTESUR NEGATIVE   Misc. Labs:   ABGS:  Recent Labs  07/08/12 1320  PHART 7.370  PO2ART 74.4*  TCO2 19.0  HCO3 21.5     MICROBIOLOGY: Recent Results (from the past 240 hour(s))  CULTURE, BLOOD (ROUTINE X 2)     Status: None   Collection Time    07/08/12  1:26 PM      Result Value Range Status   Specimen Description BLOOD LEFT FOREARM   Final   Special Requests BOTTLES DRAWN AEROBIC AND ANAEROBIC 4CC   Final   Culture PENDING   Incomplete   Report Status PENDING   Incomplete    Studies/Results: Dg Chest 2 View  07/08/2012  *RADIOLOGY REPORT*  Clinical Data: Shortness of breath, and chest pain  CHEST - 2 VIEW  Comparison: Chest x-ray of 07/07/2012  Findings: There has been an increase in patchy airspace disease bilaterally most consistent with worsening of multifocal pneumonia. Cardiomegaly is stable.  The lungs are poorly aerated.  No skeletal abnormality is seen.  IMPRESSION: Worsening of patchy airspace disease bilaterally most consistent with pneumonia.   Original Report Authenticated By: Dwyane Dee, M.D.    Dg Chest  2 View  07/07/2012  *RADIOLOGY REPORT*  Clinical Data: Fever.  Nausea.  CHEST - 2 VIEW  Comparison: 01/06/2010  Findings: The patient has bilateral patchy pulmonary infiltrates with prominent peribronchial thickening.  No effusions.  Heart size and vascularity are normal.  No acute osseous abnormality.  IMPRESSION: Bilateral pneumonia.   Original Report Authenticated By: Francene Boyers, M.D.     Medications:  Scheduled: . albuterol  2.5 mg Nebulization Q4H  . azithromycin  500 mg Intravenous Q24H  . cefTRIAXone (ROCEPHIN)  IV  1 g Intravenous Q24H  . heparin  5,000 Units Subcutaneous Q8H  . oseltamivir  75 mg Oral BID   Continuous: . sodium chloride 1,000 mL (  07/08/12 1700)   GEX:[id number], oxyCODONE  Assesment: He has bilateral community-acquired pneumonia which may be post-influenza. He is being treated with Tamiflu and is on Rocephin and Zithromax. I think we should add vancomycin because of the possibility that he has staphylococcal pneumonia. He has hypertension apparently well controlled. He has obesity. He has obstructive sleep apnea but is not taking history. His white blood count has gone down slightly he has mild thrombocytopenia which is chronic and his blood sugar is mildly elevated. His lactate is normal so I don't think he septic and his chest x-ray is worse which I think may be the natural progression as he mounts a response to pneumonia. Principal Problem:   Community acquired pneumonia Active Problems:   Obesity   HTN (hypertension)    Plan: I added vancomycin. I have discussed his situation with his wife parents and with Dr. Gerda Diss his primary physician and Dr. Karilyn Cota his hospital attending    LOS: 0 days   Lonnie Reth L 07/08/2012, 4:59 PM

## 2012-07-08 NOTE — Progress Notes (Addendum)
1610 Dr Karilyn Cota called and aware that pt is requesting something for a cough.  Droplet precautions discontinued, flu PCR negative. Md notified. Order received to d/c Tamiflu. Family at bedside and updated. Will continue to monitor.

## 2012-07-08 NOTE — ED Notes (Signed)
States his cough, fever started on Monday this past week.  States that it slowly progressed with shortness of breath.  States he is not getting any better.  Dr. Gerda Diss sent patient to  ER to be evaluated for no improvement after antibiotic therapy.

## 2012-07-08 NOTE — ED Notes (Signed)
Pt reports got sick Monday and was diagnosed with the Flu on Thursday. Pt started taking levaquin Thursday.  Was diagnosed with bilateral pneumonia Friday and z pack was added.  Today pt continues to have high fever and sob at rest.  Wife reports fevers have been as high as 102 despite taking advil and tylenol.  Denies pain.

## 2012-07-09 ENCOUNTER — Inpatient Hospital Stay (HOSPITAL_COMMUNITY): Payer: 59

## 2012-07-09 DIAGNOSIS — J111 Influenza due to unidentified influenza virus with other respiratory manifestations: Secondary | ICD-10-CM

## 2012-07-09 DIAGNOSIS — J189 Pneumonia, unspecified organism: Secondary | ICD-10-CM

## 2012-07-09 DIAGNOSIS — J9600 Acute respiratory failure, unspecified whether with hypoxia or hypercapnia: Secondary | ICD-10-CM

## 2012-07-09 DIAGNOSIS — R0902 Hypoxemia: Secondary | ICD-10-CM

## 2012-07-09 LAB — CK: Total CK: 749 U/L — ABNORMAL HIGH (ref 7–232)

## 2012-07-09 LAB — POCT I-STAT 3, ART BLOOD GAS (G3+)
Acid-Base Excess: 1 mmol/L (ref 0.0–2.0)
Acid-Base Excess: 1 mmol/L (ref 0.0–2.0)
Bicarbonate: 28.7 mEq/L — ABNORMAL HIGH (ref 20.0–24.0)
Bicarbonate: 25.5 meq/L — ABNORMAL HIGH (ref 20.0–24.0)
O2 Saturation: 100 %
O2 Saturation: 98 %
Patient temperature: 102.5
TCO2: 24 mmol/L (ref 0–100)
TCO2: 27 mmol/L (ref 0–100)
pCO2 arterial: 37.6 mmHg (ref 35.0–45.0)
pCO2 arterial: 42.3 mmHg (ref 35.0–45.0)
pH, Arterial: 7.4 (ref 7.350–7.450)
pH, Arterial: 7.396 (ref 7.350–7.450)
pO2, Arterial: 78 mmHg — ABNORMAL LOW (ref 80.0–100.0)
pO2, Arterial: 280 mmHg — ABNORMAL HIGH (ref 80.0–100.0)
pO2, Arterial: 108 mmHg — ABNORMAL HIGH (ref 80.0–100.0)

## 2012-07-09 LAB — CBC
Hemoglobin: 13.1 g/dL (ref 13.0–17.0)
MCHC: 33.8 g/dL (ref 30.0–36.0)
RDW: 12.7 % (ref 11.5–15.5)
WBC: 4.4 10*3/uL (ref 4.0–10.5)

## 2012-07-09 LAB — BASIC METABOLIC PANEL
BUN: 8 mg/dL (ref 6–23)
GFR calc Af Amer: >90 mL/min (ref >90)
GFR calc non Af Amer: 84 mL/min — ABNORMAL LOW (ref >90)
Potassium: 3.9 mEq/L (ref 3.5–5.1)

## 2012-07-09 LAB — LEGIONELLA ANTIGEN, URINE: Legionella Antigen, Urine: NEGATIVE

## 2012-07-09 LAB — GLUCOSE, CAPILLARY: Glucose-Capillary: 103 mg/dL — ABNORMAL HIGH (ref 70–99)

## 2012-07-09 MED ORDER — ROCURONIUM BROMIDE 50 MG/5ML IV SOLN
10.0000 mg | Freq: Once | INTRAVENOUS | Status: AC
  Administered 2012-07-09: 10 mg via INTRAVENOUS

## 2012-07-09 MED ORDER — FENTANYL CITRATE 0.05 MG/ML IJ SOLN
INTRAMUSCULAR | Status: AC
  Administered 2012-07-09: 200 ug
  Filled 2012-07-09: qty 4

## 2012-07-09 MED ORDER — OSELTAMIVIR PHOSPHATE 75 MG PO CAPS
75.0000 mg | ORAL_CAPSULE | Freq: Two times a day (BID) | ORAL | Status: DC
  Administered 2012-07-09 (×2): 75 mg via ORAL
  Filled 2012-07-09 (×3): qty 1

## 2012-07-09 MED ORDER — MIDAZOLAM HCL 2 MG/2ML IJ SOLN
INTRAMUSCULAR | Status: AC
  Administered 2012-07-09: 6 mg
  Filled 2012-07-09: qty 6

## 2012-07-09 MED ORDER — MIDAZOLAM BOLUS VIA INFUSION
1.0000 mg | INTRAVENOUS | Status: DC | PRN
  Filled 2012-07-09: qty 2

## 2012-07-09 MED ORDER — OSELTAMIVIR PHOSPHATE 6 MG/ML PO SUSR
75.0000 mg | Freq: Two times a day (BID) | ORAL | Status: DC
  Administered 2012-07-09 – 2012-07-10 (×2): 75 mg
  Filled 2012-07-09 (×3): qty 12.5

## 2012-07-09 MED ORDER — SODIUM CHLORIDE 0.9 % IV SOLN: INTRAVENOUS | Status: DC

## 2012-07-09 MED ORDER — CHLORHEXIDINE GLUCONATE 0.12 % MT SOLN
15.0000 mL | Freq: Two times a day (BID) | OROMUCOSAL | Status: DC
  Administered 2012-07-09 – 2012-07-15 (×12): 15 mL via OROMUCOSAL
  Filled 2012-07-09 (×12): qty 15

## 2012-07-09 MED ORDER — MIDAZOLAM HCL 5 MG/ML IJ SOLN
1.0000 mg/h | INTRAMUSCULAR | Status: DC
  Administered 2012-07-09: 2 mg/h via INTRAVENOUS
  Administered 2012-07-10: 1 mg/h via INTRAVENOUS
  Filled 2012-07-09 (×2): qty 10

## 2012-07-09 MED ORDER — HEPARIN SODIUM (PORCINE) 5000 UNIT/ML IJ SOLN
5000.0000 [IU] | Freq: Three times a day (TID) | INTRAMUSCULAR | Status: DC
  Administered 2012-07-09 – 2012-07-19 (×28): 5000 [IU] via SUBCUTANEOUS
  Filled 2012-07-09 (×32): qty 1

## 2012-07-09 MED ORDER — ALBUTEROL SULFATE HFA 108 (90 BASE) MCG/ACT IN AERS
6.0000 | INHALATION_SPRAY | Freq: Four times a day (QID) | RESPIRATORY_TRACT | Status: DC
  Administered 2012-07-09 – 2012-07-15 (×22): 6 via RESPIRATORY_TRACT
  Filled 2012-07-09 (×3): qty 6.7

## 2012-07-09 MED ORDER — ACETAMINOPHEN 160 MG/5ML PO SOLN
650.0000 mg | Freq: Four times a day (QID) | ORAL | Status: DC | PRN
  Administered 2012-07-09 – 2012-07-14 (×7): 650 mg
  Filled 2012-07-09 (×7): qty 20.3

## 2012-07-09 MED ORDER — BIOTENE DRY MOUTH MT LIQD
15.0000 mL | Freq: Four times a day (QID) | OROMUCOSAL | Status: DC
  Administered 2012-07-10 – 2012-07-15 (×23): 15 mL via OROMUCOSAL

## 2012-07-09 MED ORDER — BUPROPION HCL ER (SR) 100 MG PO TB12
200.0000 mg | ORAL_TABLET | Freq: Every day | ORAL | Status: DC
  Administered 2012-07-09: 200 mg via ORAL
  Filled 2012-07-09: qty 2

## 2012-07-09 MED ORDER — ALBUTEROL SULFATE HFA 108 (90 BASE) MCG/ACT IN AERS
6.0000 | INHALATION_SPRAY | RESPIRATORY_TRACT | Status: DC | PRN
  Administered 2012-07-11 – 2012-07-14 (×5): 6 via RESPIRATORY_TRACT
  Filled 2012-07-09: qty 6.7

## 2012-07-09 MED ORDER — LORAZEPAM 2 MG/ML IJ SOLN
1.0000 mg | Freq: Every evening | INTRAMUSCULAR | Status: DC | PRN
  Administered 2012-07-09 (×2): 1 mg via INTRAVENOUS
  Filled 2012-07-09 (×2): qty 1

## 2012-07-09 MED ORDER — PANTOPRAZOLE SODIUM 40 MG PO PACK
40.0000 mg | PACK | ORAL | Status: DC
  Administered 2012-07-09 – 2012-07-14 (×6): 40 mg
  Filled 2012-07-09 (×8): qty 20

## 2012-07-09 MED ORDER — PANTOPRAZOLE SODIUM 40 MG PO TBEC
40.0000 mg | DELAYED_RELEASE_TABLET | Freq: Every day | ORAL | Status: DC
  Administered 2012-07-09: 40 mg via ORAL
  Filled 2012-07-09: qty 1

## 2012-07-09 MED ORDER — FENTANYL CITRATE 0.05 MG/ML IJ SOLN
INTRAMUSCULAR | Status: AC
  Administered 2012-07-09: 100 ug
  Filled 2012-07-09: qty 4

## 2012-07-09 MED ORDER — DEXTROSE 5 % IV SOLN
500.0000 mg | INTRAVENOUS | Status: DC
  Administered 2012-07-09: 500 mg via INTRAVENOUS
  Filled 2012-07-09 (×2): qty 500

## 2012-07-09 MED ORDER — SODIUM CHLORIDE 0.9 % IV SOLN
25.0000 ug/h | INTRAVENOUS | Status: DC
  Administered 2012-07-09: 100 ug/h via INTRAVENOUS
  Filled 2012-07-09 (×2): qty 50

## 2012-07-09 MED ORDER — FENTANYL BOLUS VIA INFUSION
25.0000 ug | Freq: Four times a day (QID) | INTRAVENOUS | Status: DC | PRN
  Filled 2012-07-09: qty 100

## 2012-07-09 MED ORDER — MIDAZOLAM HCL 2 MG/2ML IJ SOLN
INTRAMUSCULAR | Status: AC
  Administered 2012-07-09: 2 mg
  Filled 2012-07-09: qty 4

## 2012-07-09 MED ORDER — ALBUTEROL SULFATE (5 MG/ML) 0.5% IN NEBU
2.5000 mg | INHALATION_SOLUTION | RESPIRATORY_TRACT | Status: DC | PRN
  Administered 2012-07-09 – 2012-07-16 (×2): 2.5 mg via RESPIRATORY_TRACT
  Filled 2012-07-09 (×2): qty 0.5

## 2012-07-09 MED ORDER — ETOMIDATE 2 MG/ML IV SOLN
20.0000 mg | Freq: Once | INTRAVENOUS | Status: AC
  Administered 2012-07-09: 20 mg via INTRAVENOUS

## 2012-07-09 MED ORDER — SUCCINYLCHOLINE CHLORIDE 20 MG/ML IJ SOLN
80.0000 mg | Freq: Once | INTRAMUSCULAR | Status: AC
  Administered 2012-07-09: 80 mg via INTRAVENOUS

## 2012-07-09 NOTE — Procedures (Signed)
Intubation Procedure Note Christopher Bowers SURGEON [id number] 1966-03-22  Procedure: Intubation Indications: Respiratory insufficiency  Procedure Details Consent: Risks of procedure as well as the alternatives and risks of each were explained to the (patient/caregiver).  Consent for procedure obtained. Time Out: Verified patient identification, verified procedure, site/side was marked, verified correct patient position, special equipment/implants available, medications/allergies/relevent history reviewed, required imaging and test results available.  Performed  Maximum sterile technique was used including gloves, hand hygiene and mask.  MAC and 3  Given 10 mg versed, 150 mcg fentanyl, 20 mg etomidate, 80 mg succinylcholine for procedure.    Very difficult anterior airway.    Evaluation Hemodynamic Status: Transient hypertension.t; O2 sats: transiently fell during during procedure Patient's Current Condition: stable Complications: No apparent complications Patient did tolerate procedure well. Chest X-ray ordered to verify placement.  CXR: pending.   Christopher Helling, MD [facility] Pulmonary/Critical Care 07/09/2012, 5:29 PM Pager:  [phone] After 3pm call: [phone]

## 2012-07-09 NOTE — Progress Notes (Signed)
ET tube advanced to 26 per MD

## 2012-07-09 NOTE — H&P (Signed)
Name: Christopher Bowers MRN: [id number] DOB: [birth year]    LOS: 1  PULMONARY / CRITICAL CARE MEDICINE  HPI:  Mr. Figgs is a 47 year-old gentleman admitted earlier today to [facility] for presumed influenza complicated by community acquired pneumonia.  He was hypoxic even on 6L, and was started on BiPAP and subsequently transferred to [facility] for further evaluation.  He reports symptoms starting Monday, with diffuse body aches, fever, which progressed through the week.  Past Medical History  Diagnosis Date  . Hypertension   . OSA (obstructive sleep apnea)   . Acid reflux    History reviewed. No pertinent past surgical history. Prior to Admission medications   Medication Sig Start Date End Date Taking? Authorizing Provider  buPROPion (WELLBUTRIN SR) 200 MG 12 hr tablet Take 200 mg by mouth daily.   Yes Historical Provider, MD  esomeprazole (NEXIUM) 40 MG capsule Take 40 mg by mouth daily before breakfast.   Yes Historical Provider, MD  lisinopril (PRINIVIL,ZESTRIL) 10 MG tablet Take 10 mg by mouth daily.   Yes Historical Provider, MD   Allergies Allergies  Allergen Reactions  . Amoxicillin-Pot Clavulanate Rash    Family History No family history on file. Social History  reports that he has never smoked. He does not have any smokeless tobacco history on file. He reports that he does not drink alcohol or use illicit drugs.  Review Of Systems:  All other systems were reviewed and were negative except as per HPI.  Brief patient description:  88 M with presumed flu + pneumonia vs. Mild ARDS  Events Since Admission: None  Current Status:  Vital Signs: Temp:  [99.5 F (37.5 C)-102.6 F (39.2 C)] 100.6 F (38.1 C) (02/09 0200) Pulse Rate:  [87-106] 95 (02/09 0230) Resp:  [18-34] 28 (02/09 0230) BP: (110-146)/(65-108) 130/100 mmHg (02/09 0230) SpO2:  [88 %-100 %] 98 % (02/09 0230) FiO2 (%):  [50 %] 50 % (02/09 0230) Weight:  [111.131 kg (245 lb)-113.9 kg (251 lb 1.7 oz)] 113.9 kg (251 lb  1.7 oz) (02/09 0131)  Physical Examination: General: No apparent distress. Eyes: Anicteric sclerae. ENT: Oropharynx clear. Moist mucous membranes. No thrush Lymph: No cervical, supraclavicular, or axillary lymphadenopathy. Heart: Normal S1, S2. No murmurs, rubs, or gallops appreciated. No bruits, equal pulses. Lungs: Normal excursion, no dullness to percussion. Good air movement bilaterally, without wheezes or crackles. Normal upper airway sounds without evidence of stridor. Abdomen: Abdomen soft, non-tender and not distended, normoactive bowel sounds. No hepatosplenomegaly or masses. Musculoskeletal: No clubbing or synovitis. Skin: No rashes or lesions Neuro: No focal neurologic deficits.  I have reviewed the labs and personally reviewed the radiology imaging since admission.  Ventilator Settings: Vent Mode:  [-]  FiO2 (%):  [50 %] 50 % CXR:  Bilateral infiltrates Cultures: Blood 2/8 >>> Sputum 2/8 >>> Antibiotics: Vanc 2/8 >>> Ceftriaxone 2/8 >>> Azithromycin 2/8 >>> Tamiflu 2/8 >>>  ASSESSMENT AND PLAN Principal Problem:   Community acquired pneumonia Active Problems:   Obesity   HTN (hypertension)   Influenza-like illness  Patient comfortable on BiPAP.  Will check CK given muscle soreness this far out from influenza to rule out influenza-myositis.  On appropriate antimicrobials, with currently stable respiratory effort.  Suspect will begin to improve with some rest tonight.  Lavella Hammock, M.D. Pulmonary and Critical Care Medicine [facility] Pager: [phone]  07/09/2012, 3:40 AM

## 2012-07-09 NOTE — Procedures (Signed)
Arterial Catheter Insertion Procedure Note LEIGH KAEDING [id number] 1966/02/25  Procedure: Insertion of Arterial Catheter  Indications: Blood pressure monitoring and Frequent blood sampling  Procedure Details Consent: Unable to obtain consent because of emergent medical necessity. Time Out: Verified patient identification, verified procedure, site/side was marked, verified correct patient position, special equipment/implants available, medications/allergies/relevent history reviewed, required imaging and test results available.  Performed  Maximum sterile technique was used including antiseptics, cap, gloves, gown, hand hygiene, mask and sheet. Skin prep: Chlorhexidine; local anesthetic administered 20 gauge catheter was inserted into right radial artery using the Seldinger technique.  Evaluation Blood flow good; BP tracing good. Complications: No apparent complications.   Tylene Fantasia 07/09/2012

## 2012-07-09 NOTE — Progress Notes (Signed)
PULMONARY  / CRITICAL CARE MEDICINE  Name: Christopher Bowers MRN: [id number] DOB: 1965-07-26    ADMISSION DATE:  07/08/2012 CONSULTATION DATE:  07/08/2012  REFERRING MD :  Lilly Cove  CHIEF COMPLAINT:  Short of breath  BRIEF PATIENT DESCRIPTION:  47 yo male present to [facility] on 2/8 with dyspnea, cough, and fever (Tm 102.6) with chills/aches  2nd to b/l PNA.  Failed to improved after outpt tx for levaquin for 2 days.  He was transferred to [facility] 2/9 for further tx.  SIGNIFICANT EVENTS / STUDIES:  2/9 Transfer to [facility]  LINES / TUBES: PIV  CULTURES: Blood 2/8 >>  Urine 2/8 >>  HIV 2/8 >> Non reactive Influenza 2/8 >> negative Pneumococcal Ag 2/8 >> negative Legionella Ag 2/8 >>   ANTIBIOTICS: Zithromax 2/8 >> Rocephin 2/8 >> Vancomycin 2/8 >> Tamiflu 2/8 >>   SUBJECTIVE:   VITAL SIGNS: Temp:  [99 F (37.2 C)-102.6 F (39.2 C)] 99 F (37.2 C) (02/09 0738) Pulse Rate:  [87-106] 92 (02/09 0700) Resp:  [18-34] 26 (02/09 0700) BP: (110-146)/(65-108) 135/83 mmHg (02/09 0700) SpO2:  [88 %-100 %] 99 % (02/09 0700) FiO2 (%):  [50 %-70 %] 70 % (02/09 0700) Weight:  [245 lb (111.131 kg)-251 lb 1.7 oz (113.9 kg)] 251 lb 1.7 oz (113.9 kg) (02/09 0131) HEMODYNAMICS:   VENTILATOR SETTINGS: Vent Mode:  [-]  FiO2 (%):  [50 %-70 %] 70 % INTAKE / OUTPUT: Intake/Output     02/08 0701 - 02/09 0700 02/09 0701 - 02/10 0700   P.O. 555    I.V. (mL/kg) 964.6 (8.5)    IV Piggyback 700    Total Intake(mL/kg) 2219.6 (19.5)    Urine (mL/kg/hr) 1950    Total Output 1950     Net +269.6            PHYSICAL EXAMINATION: General:  Ill appearing, speaks in full sentences Neuro:  Alert, normal strength, follows commands HEENT:  Wearing BiPAP mask Cardiovascular:  s1s2 regular, no murmur Lungs:  B/l rhochi Abdomen:  Soft, non tender Musculoskeletal:  No edema Skin:  No rashes  LABS:  Recent Labs Lab 07/08/12 1251 07/08/12 1320 07/08/12 1325 07/08/12 2335 07/09/12 0306 07/09/12 0542   HGB  --   --  13.9  --  13.1  --   WBC  --   --  3.6*  --  4.4  --   PLT  --   --  116*  --  122*  --   NA  --   --  133*  --  131*  --   K  --   --  3.8  --  3.9  --   CL  --   --  100  --  97  --   CO2  --   --  25  --  26  --   GLUCOSE  --   --  157*  --  113*  --   BUN  --   --  11  --  8  --   CREATININE  --   --  0.95  --  1.04  --   CALCIUM  --   --  8.3*  --  7.9*  --   AST  --   --  53*  --   --   --   ALT  --   --  44  --   --   --   ALKPHOS  --   --  73  --   --   --  BILITOT  --   --  0.2*  --   --   --   PROT  --   --  5.9*  --   --   --   ALBUMIN  --   --  2.9*  --   --   --   LATICACIDVEN 1.0  --   --   --   --   --   PHART  --  7.370  --  7.379  --  7.400  PCO2ART  --  38.6  --  38.1  --  37.6  PO2ART  --  74.4*  --  111.0*  --  78.0*    Recent Labs Lab 07/09/12 0205  GLUCAP 103*    Imaging: Dg Chest 2 View  07/08/2012  *RADIOLOGY REPORT*  Clinical Data: Shortness of breath, and chest pain  CHEST - 2 VIEW  Comparison: Chest x-ray of 07/07/2012  Findings: There has been an increase in patchy airspace disease bilaterally most consistent with worsening of multifocal pneumonia. Cardiomegaly is stable.  The lungs are poorly aerated.  No skeletal abnormality is seen.  IMPRESSION: Worsening of patchy airspace disease bilaterally most consistent with pneumonia.   Original Report Authenticated By: Dwyane Dee, M.D.    Dg Chest 2 View  07/07/2012  *RADIOLOGY REPORT*  Clinical Data: Fever.  Nausea.  CHEST - 2 VIEW  Comparison: 01/06/2010  Findings: The patient has bilateral patchy pulmonary infiltrates with prominent peribronchial thickening.  No effusions.  Heart size and vascularity are normal.  No acute osseous abnormality.  IMPRESSION: Bilateral pneumonia.   Original Report Authenticated By: Francene Boyers, M.D.    Dg Chest Port 1 View  07/09/2012  *RADIOLOGY REPORT*  Clinical Data: Pneumonia.  PORTABLE CHEST - 1 VIEW  Comparison: 07/08/2012  Findings: Shallow inspiration.   Airspace consolidation in both mid and lower lungs.  This appears to be increasing in density compared with the previous study, suggesting further volume loss.  Changes likely to represent multifocal pneumonia.  No blunting of costophrenic angles.  No pneumothorax.  Cardiac enlargement without pulmonary vascular congestion.  IMPRESSION: Increased volume loss and density of areas of consolidation in the mid and lower lungs bilaterally.  Progression of multifocal pneumonia is suggested.   Original Report Authenticated By: Burman Nieves, M.D.      ASSESSMENT / PLAN:  PULMONARY A: Acute respiratory failure 2nd to CAP. Hx of OSA. P:   F/u CXR Oxygen to keep SpO2 > 92% BiPAP as needed >> hopefully can avoid intubation  CARDIOVASCULAR A: Hx of HTN. P:  Hold lisinopril for now Monitor hemodynamics  RENAL A:  No active issues. P:   Monitor renal fx, urine outp  GASTROINTESTINAL A:  Nutrition. Hx of GERD. P:   Regular diet Protonix  HEMATOLOGIC A:  Thrombocytopenia - mild. P:  F/u CBC SQ heparin for DVT proph  INFECTIOUS A:  CAP >> Influenza negative, but still high risk for influenza. P:   Continue tamiflu for 5 days at least Continue zithromax, vancomycin, rocephin  ENDOCRINE A:  No issues.   P:   Monitor blood sugars on BMET  NEUROLOGIC A:  Anxiety. P:   Continue wellbutrin  CC time 45 minutes.  Updated wife at bedside (Respiratory therapist).  Coralyn Helling, MD [facility] Pulmonary/Critical Care 07/09/2012, 8:22 AM Pager:  [phone] After 3pm call: [phone]

## 2012-07-09 NOTE — Progress Notes (Signed)
Pt removed from BIPAP and placed on 5L nasal cannula per MD.  Pt tolerating well at this time.  RT will continue to monitor.

## 2012-07-09 NOTE — Progress Notes (Signed)
Vent changes made per ARDS protocol. PT now on 6cc.  RT will continue to monitor.

## 2012-07-09 NOTE — Progress Notes (Signed)
Report called to nurse on 2100 for report. [facility] present at bedside preparing for transfer. Dr. Orvan Falconer has just left bedside after assessing patient. Wife and other family leaving now for [facility].

## 2012-07-09 NOTE — Procedures (Signed)
Central Venous Catheter Insertion Procedure Note Christopher Bowers [id number] May 29, 1966  Procedure: Insertion of Central Venous Catheter Indications: Assessment of intravascular volume, Drug and/or fluid administration and Frequent blood sampling  Procedure Details Consent: Risks of procedure as well as the alternatives and risks of each were explained to the (patient/caregiver).  Consent for procedure obtained. Time Out: Verified patient identification, verified procedure, site/side was marked, verified correct patient position, special equipment/implants available, medications/allergies/relevent history reviewed, required imaging and test results available.  Performed  Maximum sterile technique was used including antiseptics, cap, gloves, gown, hand hygiene, mask and sheet. Skin prep: Chlorhexidine; local anesthetic administered A  triple lumen catheter was placed in the left internal jugular vein to 20 cm using the Seldinger technique.  Evaluation Blood flow good Complications: No apparent complications Patient did tolerate procedure well. Chest X-ray ordered to verify placement.  CXR: pending.   Procedure performed under direct supervision of Dr. Craige Cotta and with ultrasound guidance for real time vessel cannulation.     Canary Brim, NP-C [place] [facility] Pgr: [phone] or [phone]   Coralyn Helling, MD [facility] Pulmonary/Critical Care 07/09/2012, 6:27 PM Pager:  [phone] After 3pm call: [phone]

## 2012-07-10 ENCOUNTER — Inpatient Hospital Stay (HOSPITAL_COMMUNITY): Payer: 59

## 2012-07-10 DIAGNOSIS — K219 Gastro-esophageal reflux disease without esophagitis: Secondary | ICD-10-CM

## 2012-07-10 DIAGNOSIS — J9600 Acute respiratory failure, unspecified whether with hypoxia or hypercapnia: Secondary | ICD-10-CM

## 2012-07-10 LAB — POCT I-STAT 3, ART BLOOD GAS (G3+)
Acid-Base Excess: 3 mmol/L — ABNORMAL HIGH (ref 0.0–2.0)
Bicarbonate: 28.7 mEq/L — ABNORMAL HIGH (ref 20.0–24.0)
O2 Saturation: 95 %
O2 Saturation: 95 %
O2 Saturation: 92 %
O2 Saturation: 90 %
Patient temperature: 101.6
TCO2: 30 mmol/L (ref 0–100)
TCO2: 30 mmol/L (ref 0–100)
TCO2: 30 mmol/L (ref 0–100)
pCO2 arterial: 50.1 mmHg — ABNORMAL HIGH (ref 35.0–45.0)
pCO2 arterial: 53.1 mmHg — ABNORMAL HIGH (ref 35.0–45.0)
pCO2 arterial: 47.2 mmHg — ABNORMAL HIGH (ref 35.0–45.0)
pCO2 arterial: 56.7 mmHg — ABNORMAL HIGH (ref 35.0–45.0)
pO2, Arterial: 90 mmHg (ref 80.0–100.0)
pO2, Arterial: 92 mmHg (ref 80.0–100.0)
pO2, Arterial: 64 mmHg — ABNORMAL LOW (ref 80.0–100.0)

## 2012-07-10 LAB — BASIC METABOLIC PANEL
Chloride: 99 mEq/L (ref 96–112)
GFR calc Af Amer: >90 mL/min (ref >90)
GFR calc non Af Amer: 86 mL/min — ABNORMAL LOW (ref >90)
Potassium: 3.9 mEq/L (ref 3.5–5.1)
Sodium: 136 mEq/L (ref 135–145)

## 2012-07-10 LAB — CBC
Hemoglobin: 12.6 g/dL — ABNORMAL LOW (ref 13.0–17.0)
RBC: 4.16 MIL/uL — ABNORMAL LOW (ref 4.22–5.81)

## 2012-07-10 MED ORDER — FENTANYL BOLUS VIA INFUSION
25.0000 ug | Freq: Four times a day (QID) | INTRAVENOUS | Status: DC | PRN
  Filled 2012-07-10: qty 100

## 2012-07-10 MED ORDER — SODIUM CHLORIDE 0.9 % IV SOLN
25.0000 ug/h | INTRAVENOUS | Status: DC
  Administered 2012-07-10 (×2): 100 ug/h via INTRAVENOUS
  Administered 2012-07-11: 150 ug/h via INTRAVENOUS
  Filled 2012-07-10 (×3): qty 50

## 2012-07-10 MED ORDER — OXEPA PO LIQD
1000.0000 mL | ORAL | Status: DC
  Administered 2012-07-10 – 2012-07-14 (×5): 1000 mL
  Filled 2012-07-10 (×8): qty 1000

## 2012-07-10 MED ORDER — HALOPERIDOL LACTATE 5 MG/ML IJ SOLN: 2.0000 mg | Freq: Once | INTRAMUSCULAR | Status: DC

## 2012-07-10 MED ORDER — DEXTROSE 5 % IV SOLN
1.0000 g | Freq: Three times a day (TID) | INTRAVENOUS | Status: DC
  Administered 2012-07-10 – 2012-07-13 (×9): 1 g via INTRAVENOUS
  Filled 2012-07-10 (×11): qty 1

## 2012-07-10 MED ORDER — BUPROPION HCL ER (SR) 100 MG PO TB12
200.0000 mg | ORAL_TABLET | Freq: Every day | ORAL | Status: DC
  Filled 2012-07-10: qty 2

## 2012-07-10 MED ORDER — PRO-STAT SUGAR FREE PO LIQD
60.0000 mL | Freq: Four times a day (QID) | ORAL | Status: DC
  Administered 2012-07-10 – 2012-07-14 (×19): 60 mL
  Filled 2012-07-10 (×23): qty 60

## 2012-07-10 MED ORDER — FUROSEMIDE 10 MG/ML IJ SOLN
20.0000 mg | Freq: Two times a day (BID) | INTRAMUSCULAR | Status: AC
  Administered 2012-07-10 – 2012-07-14 (×9): 20 mg via INTRAVENOUS
  Filled 2012-07-10 (×11): qty 2

## 2012-07-10 MED ORDER — CEFTAZIDIME 1 G IJ SOLR
1.0000 g | Freq: Three times a day (TID) | INTRAMUSCULAR | Status: DC
  Filled 2012-07-10 (×3): qty 1

## 2012-07-10 MED ORDER — OXEPA PO LIQD
1000.0000 mL | ORAL | Status: DC
  Filled 2012-07-10 (×2): qty 1000

## 2012-07-10 MED ORDER — LEVOFLOXACIN IN D5W 750 MG/150ML IV SOLN
750.0000 mg | INTRAVENOUS | Status: DC
  Administered 2012-07-10 – 2012-07-11 (×2): 750 mg via INTRAVENOUS
  Filled 2012-07-10 (×4): qty 150

## 2012-07-10 MED ORDER — LORAZEPAM 2 MG/ML IJ SOLN: 1.0000 mg | Freq: Once | INTRAMUSCULAR | Status: DC

## 2012-07-10 MED ORDER — PROPOFOL 10 MG/ML IV EMUL
5.0000 ug/kg/min | INTRAVENOUS | Status: DC
  Administered 2012-07-10: 25 ug/kg/min via INTRAVENOUS
  Administered 2012-07-10: 10 ug/kg/min via INTRAVENOUS
  Administered 2012-07-10: 30 ug/kg/min via INTRAVENOUS
  Administered 2012-07-11: 20 ug/kg/min via INTRAVENOUS
  Administered 2012-07-11: 30 ug/kg/min via INTRAVENOUS
  Administered 2012-07-11: 29.953 ug/kg/min via INTRAVENOUS
  Administered 2012-07-12: 15 ug/kg/min via INTRAVENOUS
  Administered 2012-07-12: 20 ug/kg/min via INTRAVENOUS
  Filled 2012-07-10 (×8): qty 100

## 2012-07-10 MED ORDER — OSELTAMIVIR PHOSPHATE 6 MG/ML PO SUSR
150.0000 mg | Freq: Two times a day (BID) | ORAL | Status: DC
  Administered 2012-07-10 – 2012-07-14 (×9): 150 mg
  Filled 2012-07-10 (×11): qty 25

## 2012-07-10 NOTE — Progress Notes (Addendum)
Agitation and anxiety   Ativan ordered   Ordered by mistake

## 2012-07-10 NOTE — Care Management Note (Signed)
  Page 2 of 2   07/18/2012     4:05:31 PM   CARE MANAGEMENT NOTE 07/18/2012  Patient:  [facility]   Account Number:  [id number]  Date Initiated:  07/10/2012  Documentation initiated by:  [facility]  Subjective/Objective Assessment:   resp failure - requiring intubation, on 07-09-12.     Action/Plan:   Anticipated DC Date:  07/19/2012   Anticipated DC Plan:  HOME W HOME HEALTH SERVICES      DC Planning Services  CM consult      Choice offered to / List presented to:  C-1 Patient   DME arranged  Levan Hurst      DME agency  [facility]     HH arranged  HH-2 PT      HH agency  [facility]   Status of service:  Completed, signed off Medicare Important Message given?   (If response is "NO", the following Medicare IM given date fields will be blank) Date Medicare IM given:   Date Additional Medicare IM given:    Discharge Disposition:  HOME W HOME HEALTH SERVICES  Per UR Regulation:  Reviewed for med. necessity/level of care/duration of stay  If discussed at Long Length of Stay Meetings, dates discussed:    Comments:  Contact:  Farinas,Christy Spouse [phone]  07-18-12 Patient's wife wanted [facility] . Confirmed with [facility] they do not service the [place] area .  Patient's wife 's second choice was [facility] , however, they do not accept patient's insurance.  Patient's wife's third choice is [facility] [phone] .  [facility] [phone] , accepted patient . Information faxed to [phone]  Ronny Flurry RN BSN [phone]    07-18-12 Plan to discharge patient to home tomorrow 07-19-12 with home health PT . Confirmed patient's face sheet information with him . Wife Neysa Bonito 's cell phone is [phone] , patient's cell phone is [phone] .  Left list of home health agencies for [place] area with patient .  His wife will be back in a hour . He will  discuss home health with his wife . Left my contact information with patient . Will follow up with patient .   Ronny Flurry RN BSN [phone]    07-12-12 8:15am Avie Arenas RNBS - [phone] Remains on vent - CIR looking at for possible candidate once extubated.  Walking in [place] on vent - sitting up in chair.  07-10-12 12noon Avie Arenas, RNBSN - [phone] [facility] following.

## 2012-07-10 NOTE — Procedures (Signed)
Bronchoscopy Procedure Note AVIV LENGACHER [id number] 1965-06-13  Procedure: Bronchoscopy Indications: Diagnostic evaluation of the airways and Obtain specimens for culture and/or other diagnostic studies  Procedure Details Consent: Risks of procedure as well as the alternatives and risks of each were explained to the (patient/caregiver).  Consent for procedure obtained. Time Out: Verified patient identification, verified procedure, site/side was marked, verified correct patient position, special equipment/implants available, medications/allergies/relevent history reviewed, required imaging and test results available.  Performed  In preparation for procedure, patient was given 100% FiO2 and bronchoscope lubricated. Sedation: prop  Airway entered and the following bronchi were examined: RUL, RML, RLL, LUL, LLL and Bronchi.   Procedures performed: Brushings no Bronchoscope removed.  , Patient placed back on 100% FiO2 at conclusion of procedure.    Evaluation Hemodynamic Status: BP stable throughout; O2 sats: stable throughout Patient's Current Condition: stable Specimens:  Sent purulent fluid Complications: No apparent complications Patient did tolerate procedure well.   Nelda Bucks. 07/10/2012  1. Diffuse erythema, pus all lobes 2. n collpase 3. No endobronchial lesions 4. BAL rml,   Mcarthur Rossetti. Tyson Alias, MD, FACP Pgr: [phone] [place] [facility]

## 2012-07-10 NOTE — Progress Notes (Signed)
PULMONARY  / CRITICAL CARE MEDICINE  Name: SHAILEN THIELEN MRN: [id number] DOB: [birth year]    ADMISSION DATE:  07/08/2012 CONSULTATION DATE:  07/08/2012  REFERRING MD :  Lilly Cove  CHIEF COMPLAINT:  Short of breath  BRIEF PATIENT DESCRIPTION:  47 yo male present to [facility] on 2/8 with dyspnea, cough, and fever (Tm 102.6) with chills/aches  2nd to b/l PNA.  Failed to improved after outpt tx for levaquin for 2 days.  He was transferred to [facility] 2/9 for further tx.  SIGNIFICANT EVENTS / STUDIES:  2/9 Transfer to [facility] 2/9- intubation  LINES / TUBES: ETT 2/9 >> R arterial line 2/9 >> L ij cvc 2/9 >> PIV  CULTURES: Blood 2/8 >>  Urine 2/8 >>  HIV 2/8 >> Non reactive Influenza 2/8 >> negative Pneumococcal Ag 2/8 >> negative Legionella Ag 2/8 >> negative Resp viral panel 2/9 >>  ANTIBIOTICS: Zithromax 2/8 >> Rocephin 2/8 >> Vancomycin 2/8 >> Tamiflu 2/8 >>   SUBJECTIVE:  Wife reports a mostly restful night.  Will sometimes wake up a little and try to tug at ETT.    VITAL SIGNS: Temp:  [99.2 F (37.3 C)-103 F (39.4 C)] 99.3 F (37.4 C) (02/10 0812) Pulse Rate:  [53-104] 93 (02/10 0800) Resp:  [16-36] 32 (02/10 0800) BP: (95-217)/(49-129) 120/78 mmHg (02/10 0800) SpO2:  [78 %-99 %] 95 % (02/10 0800) FiO2 (%):  [0 %-100 %] 50 % (02/10 0800) Weight:  [247 lb 12.8 oz (112.4 kg)] 247 lb 12.8 oz (112.4 kg) (02/10 0600) HEMODYNAMICS:   VENTILATOR SETTINGS: Vent Mode:  [-] PRVC FiO2 (%):  [0 %-100 %] 50 % Set Rate:  [26 bmp-32 bmp] 32 bmp Vt Set:  [450 mL-600 mL] 450 mL PEEP:  [8 cmH20-10 cmH20] 8 cmH20 Plateau Pressure:  [19 cmH20-29 cmH20] 19 cmH20 INTAKE / OUTPUT: Intake/Output     02/09 0701 - 02/10 0700 02/10 0701 - 02/11 0700   P.O. 480    I.V. (mL/kg) 456.3 (4.1) 12 (0.1)   NG/GT 170    IV Piggyback 500    Total Intake(mL/kg) 1606.3 (14.3) 12 (0.1)   Urine (mL/kg/hr) 2465 (0.9) 125 (0.9)   Emesis/NG output 100 (0)    Total Output 2565 125   Net -958.7 -113.1           PHYSICAL EXAMINATION: General: sedated, mittens in place Neuro:  sedated HEENT:  ETT in place Cardiovascular:  s1s2 regular, no murmur Lungs:  B/l rhochi; rales in left base Abdomen:  Soft, non tender Musculoskeletal:  No edema Skin:  No rashes  LABS:  Recent Labs Lab 07/08/12 1251  07/08/12 1325  07/09/12 0306  07/09/12 1921 07/10/12 0400 07/10/12 0424 07/10/12 0756  HGB  --   --  13.9  --  13.1  --   --  12.6*  --   --   WBC  --   --  3.6*  --  4.4  --   --  5.5  --   --   PLT  --   --  116*  --  122*  --   --  163  --   --   NA  --   --  133*  --  131*  --   --  136  --   --   K  --   < > 3.8  --  3.9  --   --  3.9  --   --   CL  --   --  100  --  97  --   --  99  --   --   CO2  --   --  25  --  26  --   --  29  --   --   GLUCOSE  --   --  157*  --  113*  --   --  117*  --   --   BUN  --   --  11  --  8  --   --  12  --   --   CREATININE  --   --  0.95  --  1.04  --   --  1.02  --   --   CALCIUM  --   --  8.3*  --  7.9*  --   --  8.2*  --   --   AST  --   --  53*  --   --   --   --   --   --   --   ALT  --   --  44  --   --   --   --   --   --   --   ALKPHOS  --   --  73  --   --   --   --   --   --   --   BILITOT  --   --  0.2*  --   --   --   --   --   --   --   PROT  --   --  5.9*  --   --   --   --   --   --   --   ALBUMIN  --   --  2.9*  --   --   --   --   --   --   --   LATICACIDVEN 1.0  --   --   --   --   --   --   --   --   --   PHART  --   < >  --   < >  --   < > 7.396  --  7.373 7.345*  PCO2ART  --   < >  --   < >  --   < > 42.3  --  50.1* 53.1*  PO2ART  --   < >  --   < >  --   < > 108.0*  --  90.0 92.0  < > = values in this interval not displayed.  Recent Labs Lab 07/09/12 0205  GLUCAP 103*    Imaging: Dg Chest 2 View  07/08/2012  *RADIOLOGY REPORT*  Clinical Data: Shortness of breath, and chest pain  CHEST - 2 VIEW  Comparison: Chest x-ray of 07/07/2012  Findings: There has been an increase in patchy airspace disease bilaterally most  consistent with worsening of multifocal pneumonia. Cardiomegaly is stable.  The lungs are poorly aerated.  No skeletal abnormality is seen.  IMPRESSION: Worsening of patchy airspace disease bilaterally most consistent with pneumonia.   Original Report Authenticated By: Dwyane Dee, M.D.    Dg Chest Port 1 View  07/10/2012  *RADIOLOGY REPORT*  Clinical Data: Pneumonia, evaluate endotracheal tube positioning  PORTABLE CHEST - 1 VIEW  Comparison: 07/09/2012; 07/07/2012; 01/06/2010  Findings: Interval advancement of endotracheal tube with tip overlying tracheal air column approximately 2.1 cm above the carina.  Interval placement of a  enteric tube with tip inside port projecting below the left hemidiaphragm.  Stable positioning of left jugular approach venous catheter with tip projected over the superior SVC. Minimal increase in extensive bilateral heterogeneous air space opacities, worse within the right upper and perihilar lung as well as within the peripheral aspect of the left mid lung. No definite pneumothorax or pleural effusion.  Unchanged bones.  IMPRESSION: 1.  Interval placement of enteric tube with tip and sideport projecting below the left hemidiaphragm. 2.  Remaining support apparatus appropriately positioned as above. No pneumothorax. 3.  Suspected worsening of bilateral air space opacities compatible with progression of of focal infection.   Original Report Authenticated By: Tacey Ruiz, MD    Dg Chest Port 1 View  07/09/2012  *RADIOLOGY REPORT*  Clinical Data: Endotracheal tube placement.  Central line placement.  Obstructive sleep apnea.  Hypertension.  PORTABLE CHEST - 1 VIEW  Comparison: 07/09/2012  Findings: Endotracheal tube tip 4.9 cm above the carina.  Left IJ line tip:  Upper SVC.  There is probably gas within the esophagus, causing the double contour along the trachea.  Pneumomediastinum is a less likely differential diagnostic consideration.  Low lung volumes noted with bilateral airspace  opacities.  Right perihilar air bronchograms noted.  Slightly improved airspace opacity in the left midlung.  Heart size within normal limits for technique.  IMPRESSION:  1.  ET tube tip:  4.9 cm above carina. 2.  Left IJ line tip:  Upper SVC.  No pneumothorax. 3.  Slightly improved aeration on the left, although significant bilateral airspace opacities persist. 4.  Low lung volumes.   Original Report Authenticated By: Gaylyn Rong, M.D.    Dg Chest Port 1 View  07/09/2012  *RADIOLOGY REPORT*  Clinical Data: Pneumonia.  PORTABLE CHEST - 1 VIEW  Comparison: 07/08/2012  Findings: Shallow inspiration.  Airspace consolidation in both mid and lower lungs.  This appears to be increasing in density compared with the previous study, suggesting further volume loss.  Changes likely to represent multifocal pneumonia.  No blunting of costophrenic angles.  No pneumothorax.  Cardiac enlargement without pulmonary vascular congestion.  IMPRESSION: Increased volume loss and density of areas of consolidation in the mid and lower lungs bilaterally.  Progression of multifocal pneumonia is suggested.   Original Report Authenticated By: Burman Nieves, M.D.      ASSESSMENT / PLAN:  PULMONARY A: Acute respiratory failure 2nd to CAP. Hx of OSA. Likely ARDS, await cvp P:   ARDS protocol, abg reviewed Please note, cvp may be up from pulm pressures with h/o OSA pcxr worsening pcxr in am  Avoid paralysis Keep current TV 6 cc/kg, plat 19 After cvp to factt trial rass goal -2, may need -3  CARDIOVASCULAR A: Hx of HTN. P:  Hold lisinopril for now Monitor hemodynamics cvp assessment, consider lasix Echo for pulm pressure kvo  RENAL A:  No active issues. P:   Monitor renal fx, urine outp cvp awaited, may give lasix  GASTROINTESTINAL A:  Nutrition. Hx of GERD. P:   STAR TF now, oxepa Protonix  HEMATOLOGIC A:  Thrombocytopenia - mild, improved, sepsis P:  F/u CBC SQ heparin for DVT  proph  INFECTIOUS A:  CAP >> Influenza negative, but still high risk for influenza, continues to have temp P:   Continue tamiflu for 5 days at least Continue vancomycin, dc rocephin, azithro Worsening pcxr, low threshold to change ceftriaxone to ceftaz with fevers Add levofloxacin Consider bronch BAL, worsening clinical status, fevers, infiltrates  Ensure double dose tami 150 bid  ENDOCRINE A:  No issues.   P:   Monitor blood sugars on BMET  NEUROLOGIC A:  Anxiety. Vent dyschrony P:   Continue Wellbutrin fent drip Dc versed as high risk delirium, dc Add propofol Consider MIND [place] assessment CAM neg  BOOTH, ERIN Family Medicine, PGY-2 07/10/2012, 8:16 AM  Ccm time 35 min   I have fully examined this patient and agree with above findings.    And edited in full  Mcarthur Rossetti. Tyson Alias, MD, FACP Pgr: [phone] [place] [facility]

## 2012-07-10 NOTE — Progress Notes (Signed)
Patient evaluated for [facility] Care Management services/Link to Wellness program for [employer] with [employer]. Patient currently on ventilator in ICU. Will continue to follow. Made inpatient case manager aware that [facility] Care Management was following.  Raiford Noble, MSN- Ed, RN,BSN [facility] Liaison [phone]

## 2012-07-10 NOTE — Progress Notes (Addendum)
INITIAL NUTRITION ASSESSMENT  DOCUMENTATION CODES Per approved criteria  -Obesity Unspecified   INTERVENTION:  Initiate TF via OGT with Oxepa at 20 ml/h, increase by 10 ml every 4 hours to goal rate of 30 ml/h with Prostat 60 ml QID to provide 1880 kcals (24 kcals/kg ideal weight), 165 gm protein, 565 ml free water daily.  NUTRITION DIAGNOSIS: Inadequate oral intake related to inability to eat as evidenced by NPO status.   Goal: Enteral nutrition to provide 60-70% of estimated calorie needs (22-25 kcals/kg ideal body weight) and >/= 90% of estimated protein needs, based on ASPEN guidelines for permissive underfeeding in critically ill obese individuals.  Monitor:  TF initiation/tolerance, labs, weight trend, vent status.  Reason for Assessment: VDRF; MD Consult for TF initiation and management.  47 y.o. male  Admitting Dx: Community acquired pneumonia  ASSESSMENT: Patient presented to [facility] on 2/8 with dyspnea, cough, and fever (Tm 102.6) with chills/aches 2nd to b/l PNA. Failed to improved after outpt tx of levaquin for 2 days. He was transferred to [facility] 2/9 for further tx.  Required intubation on 2/9.  ARDS protocol in place.  Received consult for initiation and management of TF.  Patient is currently intubated on ventilator support.  MV: 14 Temp:Temp (24hrs), Avg:101 F (38.3 C), Min:99.2 F (37.3 C), Max:103 F (39.4 C)   Abdomen distended with hypoactive bowel sounds per RN documentation in flow sheets.  Height: Ht Readings from Last 1 Encounters:  07/09/12 5\' 11"  (1.803 m)    Weight: Wt Readings from Last 1 Encounters:  07/10/12 247 lb 12.8 oz (112.4 kg)    Ideal Body Weight: 78.2 kg  % Ideal Body Weight: 144%  Wt Readings from Last 10 Encounters:  07/10/12 247 lb 12.8 oz (112.4 kg)  01/27/10 240 lb 8 oz (109.09 kg)    Usual Body Weight: 240 lb (2.5 years ago)  % Usual Body Weight: 103%  BMI:  Body mass index is 34.58 kg/(m^2). class 1  obesity  Estimated Nutritional Needs: Kcal: 2750 (Underfeeding goal:  1720-1955 kcals) Protein: > 155 gm Fluid: 2.7 L  Skin: no problems  Diet Order: NPO  EDUCATION NEEDS: -Education not appropriate at this time   Intake/Output Summary (Last 24 hours) at 07/10/12 1229 Last data filed at 07/10/12 1200  Gross per 24 hour  Intake 1165.78 ml  Output   1865 ml  Net -699.22 ml    Last BM: 2/6   Labs:   Recent Labs Lab 07/08/12 1325 07/09/12 0306 07/10/12 0400  NA 133* 131* 136  K 3.8 3.9 3.9  CL 100 97 99  CO2 25 26 29   BUN 11 8 12   CREATININE 0.95 1.04 1.02  CALCIUM 8.3* 7.9* 8.2*  GLUCOSE 157* 113* 117*    CBG (last 3)   Recent Labs  07/09/12 0205  GLUCAP 103*    Scheduled Meds: . albuterol  6 puff Inhalation Q6H  . antiseptic oral rinse  15 mL Mouth Rinse QID  . azithromycin  500 mg Intravenous Q24H  . cefTRIAXone (ROCEPHIN)  IV  1 g Intravenous Q24H  . chlorhexidine  15 mL Mouth Rinse BID  . heparin  5,000 Units Subcutaneous Q8H  . oseltamivir  75 mg Per Tube BID  . pantoprazole sodium  40 mg Per Tube Q24H  . vancomycin  1,000 mg Intravenous Q12H    Continuous Infusions: . sodium chloride Stopped (07/09/12 1857)  . fentaNYL infusion INTRAVENOUS 100 mcg/hr (07/10/12 1200)  . midazolam (VERSED) infusion 2 mg/hr (  07/10/12 0945)    Past Medical History  Diagnosis Date  . Hypertension   . OSA (obstructive sleep apnea)   . Acid reflux     History reviewed. No pertinent past surgical history.   Joaquin Courts, RD, LDN, CNSC Pager# [phone] After Hours Pager# [phone]

## 2012-07-10 NOTE — Progress Notes (Signed)
Assisted MD with Bronch.  Pt pre oxygenated with 100%.  Sputum sample obtained.  Pt tolerated well, RT to monitor

## 2012-07-11 ENCOUNTER — Inpatient Hospital Stay (HOSPITAL_COMMUNITY): Payer: 59

## 2012-07-11 HISTORY — PX: TRANSTHORACIC ECHOCARDIOGRAM: SHX275

## 2012-07-11 LAB — POCT I-STAT 3, ART BLOOD GAS (G3+)
O2 Saturation: 90 %
TCO2: 33 mmol/L (ref 0–100)
pCO2 arterial: 56.4 mmHg — ABNORMAL HIGH (ref 35.0–45.0)

## 2012-07-11 LAB — RESPIRATORY VIRUS PANEL
Adenovirus: NOT DETECTED
Influenza A H1: NOT DETECTED
Influenza A H3: NOT DETECTED
Influenza A: DETECTED — AB
Influenza A: DETECTED — AB
Influenza B: NOT DETECTED
Influenza B: NOT DETECTED
Parainfluenza 2: NOT DETECTED
Parainfluenza 3: NOT DETECTED
Respiratory Syncytial Virus A: NOT DETECTED

## 2012-07-11 LAB — GLUCOSE, CAPILLARY
Glucose-Capillary: 135 mg/dL — ABNORMAL HIGH (ref 70–99)
Glucose-Capillary: 142 mg/dL — ABNORMAL HIGH (ref 70–99)

## 2012-07-11 LAB — CBC
Hemoglobin: 11.9 g/dL — ABNORMAL LOW (ref 13.0–17.0)
MCH: 30.8 pg (ref 26.0–34.0)
MCV: 93 fL (ref 78.0–100.0)
RBC: 3.86 MIL/uL — ABNORMAL LOW (ref 4.22–5.81)

## 2012-07-11 LAB — BASIC METABOLIC PANEL
BUN: 25 mg/dL — ABNORMAL HIGH (ref 6–23)
CO2: 32 mEq/L (ref 19–32)
Calcium: 8.3 mg/dL — ABNORMAL LOW (ref 8.4–10.5)
Creatinine, Ser: 1.07 mg/dL (ref 0.50–1.35)
Glucose, Bld: 131 mg/dL — ABNORMAL HIGH (ref 70–99)

## 2012-07-11 MED ORDER — INSULIN ASPART 100 UNIT/ML ~~LOC~~ SOLN
0.0000 [IU] | SUBCUTANEOUS | Status: DC
  Administered 2012-07-11 – 2012-07-15 (×22): 1 [IU] via SUBCUTANEOUS

## 2012-07-11 MED ORDER — VANCOMYCIN HCL 10 G IV SOLR
1250.0000 mg | Freq: Three times a day (TID) | INTRAVENOUS | Status: DC
  Administered 2012-07-11 – 2012-07-12 (×3): 1250 mg via INTRAVENOUS
  Filled 2012-07-11 (×6): qty 1250

## 2012-07-11 NOTE — Evaluation (Signed)
Physical Therapy Evaluation Patient Details Name: ZLATAN HORNBACK MRN: [id number] DOB: 1965-10-24 Today's Date: 07/11/2012 Time: 1010-1041 PT Time Calculation (min): 31 min  PT Assessment / Plan / Recommendation Clinical Impression  Pt s/p PNA with VDRF with decr mobility secondary to decr endurance and decr balance.  Will benefit from PT to address balance and endurance issues.  May need a Rehab c/s depending on progression over next few days.      PT Assessment  Patient needs continued PT services    Follow Up Recommendations  CIR;Supervision - Intermittent                Equipment Recommendations  Other (comment) (TBA)    Recommendations for Other Services Rehab consult   Frequency Min 3X/week    Precautions / Restrictions Precautions Precautions: Fall Precaution Comments: on ventilator Restrictions Weight Bearing Restrictions: No   Pertinent Vitals/Pain See doc flowsheets      Mobility  Bed Mobility Bed Mobility: Rolling Left;Left Sidelying to Sit;Sitting - Scoot to Edge of Bed Rolling Left: 3: Mod assist;With rail Left Sidelying to Sit: 3: Mod assist;HOB flat;With rails Sitting - Scoot to Edge of Bed: 4: Min assist Details for Bed Mobility Assistance: cues and asssit for sequencing and technique Transfers Transfers: Sit to Stand;Stand to Sit;Stand Pivot Transfers Sit to Stand: 1: +2 Total assist;With upper extremity assist;From bed Sit to Stand: Patient Percentage: 50% Stand to Sit: 1: +2 Total assist;With upper extremity assist;With armrests;To chair/3-in-1 Stand to Sit: Patient Percentage: 60% Stand Pivot Transfers: 1: +2 Total assist Stand Pivot Transfers: Patient Percentage: 60% Details for Transfer Assistance: Pt needed steadying asssit as well as assist to initiate sit to stand.  Pt able to take pivotal steps bed to chair without a lot of difficulty actually.  Needed a little assist to initiate weight shifting and then pt was able to pivot around.  Fatigued  quickly.  Good control of descent into chair initially and then needed help not to plop. Ambulation/Gait Ambulation/Gait Assistance: Not tested (comment) Stairs: No Wheelchair Mobility Wheelchair Mobility: No         PT Diagnosis: Generalized weakness  PT Problem List: Decreased activity tolerance;Decreased balance;Decreased mobility;Decreased knowledge of precautions;Decreased safety awareness;Decreased knowledge of use of DME PT Treatment Interventions: DME instruction;Gait training;Functional mobility training;Therapeutic activities;Balance training;Therapeutic exercise;Stair training;Patient/family education   PT Goals Acute Rehab PT Goals PT Goal Formulation: With patient Time For Goal Achievement: 07/25/12 Potential to Achieve Goals: Good Pt will go Supine/Side to Sit: Independently PT Goal: Supine/Side to Sit - Progress: Goal set today Pt will Sit at Edge of Bed: Independently;6-10 min;with no upper extremity support PT Goal: Sit at Edge Of Bed - Progress: Goal set today Pt will go Sit to Stand: with supervision PT Goal: Sit to Stand - Progress: Goal set today Pt will Transfer Bed to Chair/Chair to Bed: with modified independence PT Transfer Goal: Bed to Chair/Chair to Bed - Progress: Goal set today Pt will Ambulate: 51 - 150 feet;with supervision;with least restrictive assistive device PT Goal: Ambulate - Progress: Goal set today Pt will Go Up / Down Stairs: 3-5 stairs;with modified independence;with least restrictive assistive device PT Goal: Up/Down Stairs - Progress: Goal set today  Visit Information  Last PT Received On: 07/11/12 Assistance Needed: +2    Subjective Data  Subjective: Pt intubated Patient Stated Goal: To go home   Prior Functioning  Home Living Lives With: Spouse Type of Home: House Home Layout: One level Firefighter: Standard Home Adaptive Equipment:  None Additional Comments: Unsure about steps at home.   Prior Function Level of  Independence: Independent Able to Take Stairs?: Yes Driving: Yes Vocation: Full time employment Comments: Buys and sells heavy equipment Communication Communication: Other (comment) (ventilator)    Cognition  Cognition Overall Cognitive Status: Difficult to assess Difficult to assess due to: Intubated Arousal/Alertness: Lethargic Orientation Level: Other (comment) (unable to assess) Behavior During Session: Lethargic    Extremity/Trunk Assessment Right Lower Extremity Assessment RLE ROM/Strength/Tone: [facility] for tasks assessed Left Lower Extremity Assessment LLE ROM/Strength/Tone: [facility] for tasks assessed Trunk Assessment Trunk Assessment: Normal   Balance Static Sitting Balance Static Sitting - Balance Support: Bilateral upper extremity supported;Feet supported Static Sitting - Level of Assistance: 5: Stand by assistance Static Sitting - Comment/# of Minutes: 3 minutes, LOB posteriorly x 1 when pt lethargy incr and he started dozing.   Static Standing Balance Static Standing - Balance Support: Bilateral upper extremity supported;During functional activity Static Standing - Level of Assistance: 3: Mod assist Static Standing - Comment/# of Minutes: Needed steadying assist of two persons for transfer as pt intubated and slightly lethargic due to pain meds.    End of Session PT - End of Session Equipment Utilized During Treatment: Gait belt;Oxygen Activity Tolerance: Patient limited by fatigue Patient left: in chair;with call bell/phone within reach;with nursing in room Nurse Communication: Mobility status       INGOLD,Morris Markham 07/11/2012, 12:01 PM [facility] [phone] (pager)

## 2012-07-11 NOTE — Progress Notes (Signed)
  Echocardiogram 2D Echocardiogram has been performed.  Georgian Co 07/11/2012, 10:29 AM

## 2012-07-11 NOTE — Progress Notes (Signed)
Rehab Admissions Coordinator Note:  Patient was screened by Brock Ra for appropriateness for an Inpatient Acute Rehab Consult.  At this time, we are recommending Inpatient Rehab consult.   Brock Ra 07/11/2012, 1:57 PM  I can be reached at [phone].

## 2012-07-11 NOTE — Progress Notes (Signed)
ANTIBIOTIC CONSULT NOTE - Follow-Up  Pharmacy Consult for Vancomycin Indication: pneumonia  Allergies  Allergen Reactions  . Amoxicillin-Pot Clavulanate Rash    Patient Measurements: Height: 5\' 11"  (180.3 cm) Weight: 244 lb 4.3 oz (110.8 kg) IBW/kg (Calculated) : 75.3  Vital Signs: Temp: 101.9 F (38.8 C) (02/11 0342) Temp src: Oral (02/11 0342) BP: 115/67 mmHg (02/11 0600) Pulse Rate: 89 (02/11 0700) Intake/Output from previous day: 02/10 0701 - 02/11 0700 In: 1739.5 [I.V.:779.5; NG/GT:460; IV Piggyback:500] Out: 2430 [Urine:2430] Intake/Output from this shift: Total I/O In: 10 [I.V.:10] Out: -   Labs:  Recent Labs  07/09/12 0306 07/10/12 0400 07/11/12 0500  WBC 4.4 5.5 6.7  HGB 13.1 12.6* 11.9*  PLT 122* 163 182  CREATININE 1.04 1.02 1.07   Estimated Creatinine Clearance: 109.2 ml/min (by C-G formula based on Cr of 1.07).  Recent Labs  07/11/12 0520  VANCOTROUGH 7.7*     Microbiology: Recent Results (from the past 720 hour(s))  CULTURE, BLOOD (ROUTINE X 2)     Status: None   Collection Time    07/08/12 12:49 PM      Result Value Range Status   Specimen Description BLOOD LEFT ANTECUBITAL   Final   Special Requests BOTTLES DRAWN AEROBIC AND ANAEROBIC 4CC   Final   Culture NO GROWTH 2 DAYS   Final   Report Status PENDING   Incomplete  CULTURE, BLOOD (ROUTINE X 2)     Status: None   Collection Time    07/08/12  1:26 PM      Result Value Range Status   Specimen Description BLOOD LEFT FOREARM   Final   Special Requests BOTTLES DRAWN AEROBIC AND ANAEROBIC 4CC   Final   Culture NO GROWTH 2 DAYS   Final   Report Status PENDING   Incomplete  MRSA PCR SCREENING     Status: Abnormal   Collection Time    07/08/12  5:30 PM      Result Value Range Status   MRSA by PCR INVALID RESULTS, SPECIMEN SENT FOR CULTURE (*) NEGATIVE Final   Comment:            The GeneXpert MRSA Assay (FDA     approved for NASAL specimens     only), is one component of a   comprehensive MRSA colonization     surveillance program. It is not     intended to diagnose MRSA     infection nor to guide or     monitor treatment for     MRSA infections.  CULTURE, RESPIRATORY (NON-EXPECTORATED)     Status: None   Collection Time    07/09/12  8:49 PM      Result Value Range Status   Specimen Description ENDOTRACHEAL ASPIRATE   Final   Special Requests NONE   Final   Gram Stain     Final   Value: FEW WBC PRESENT, PREDOMINANTLY PMN     NO SQUAMOUS EPITHELIAL CELLS SEEN     RARE GRAM NEGATIVE RODS   Culture PENDING   Incomplete   Report Status PENDING   Incomplete  CULTURE, BAL-QUANTITATIVE     Status: None   Collection Time    07/10/12  3:01 PM      Result Value Range Status   Specimen Description BRONCHIAL ALVEOLAR LAVAGE   Final   Special Requests NONE   Final   Gram Stain     Final   Value: ABUNDANT WBC PRESENT,BOTH PMN AND MONONUCLEAR  NO ORGANISMS SEEN     Performed at [facility] Count PENDING   Incomplete   Culture PENDING   Incomplete   Report Status PENDING   Incomplete     Assessment: 47 yo obese M with flu and subsequent bilateral PNA which worsened on outpatient course of Levaquin. Pt continues on Vancomycin and Ceftazidime Day#4 for CAP, Tmax 101.9. WBC WNL, Influenza PCR neg but high risk so continue Tamiflu Day #3/10. HIV neg.   Vancomycin trough 7.7 (subtherapeutic) - goal 15-20 mcg/ml.  Vanc 2/8 >> Azith 2/8 >>2/10 CTX 2/8 >> 2/10 Tamiflu 2/8 >>2/17 Ceftaz 2/10>>  2/8 blood cx - ngtd 2/9 endotrach asp - Rare GNR 2/10 BAL - pending  Goal of Therapy:  Vancomycin trough level 15-20 mcg/ml  Plan:  1) Change Vancomycin to 1250mg  IV q8h 2) Continue Ceftazidime 1gm IV q8h and Tamiflu 150mg  per tube BID (per MD) 3) Check Vancomycin trough at new steady state 4) Monitor renal function and cx data  Christoper Fabian, PharmD, BCPS Clinical pharmacist, pager [phone] 07/11/2012,8:38 AM

## 2012-07-11 NOTE — Research (Signed)
MIND [place] study to access the effects of intravenously administered typical and a typical antipsychotics (haldol and ziprasidone) on delirium in critically ill patients discussed with patient's wife by Dr. Tyson Alias and myself.  All questions and concerns were addressed and answered prior to obtaining informed consent.  No study procedures were initiated prior to obtaining informed consent.  A copy of the signed consent was provided to the family member.  At this time the patient is CAM ICU negative and will remain in the observational phase of the study for 96 hours.  Please contact study coordinator [phone]) or Dr. Tyson Alias with any questions or concerns.

## 2012-07-11 NOTE — Progress Notes (Signed)
PULMONARY  / CRITICAL CARE MEDICINE  Name: Christopher Bowers MRN: [id number] DOB: 11/07/1965    ADMISSION DATE:  07/08/2012 CONSULTATION DATE:  07/08/2012  REFERRING MD :  Lilly Cove  CHIEF COMPLAINT:  Short of breath  BRIEF PATIENT DESCRIPTION:  47 yo male present to [facility] on 2/8 with dyspnea, cough, and fever (Tm 102.6) with chills/aches  2nd to b/l PNA.  Failed to improved after outpt tx for levaquin for 2 days.  He was transferred to [facility] 2/9 for further tx.  SIGNIFICANT EVENTS / STUDIES:  2/9 Transfer to [facility] 2/9- intubation  LINES / TUBES: ETT 2/9 >> R arterial line 2/9 >> L ij cvc 2/9 >> PIV  CULTURES: Blood 2/8 >>  HIV 2/8 >> Non reactive Influenza 2/8 >> negative Pneumococcal Ag 2/8 >> negative Legionella Ag 2/8 >> negative Resp viral panel 2/9 >> ET Aspirate 2/8 >> BAL 2/10 >>  ANTIBIOTICS: Zithromax 2/8 >>2/10 Rocephin 2/8 >>2/10 Vancomycin 2/8 >> Tamiflu 2/8 >>  Ceftazidime 2/10 >> Levofloxacin 2/10 >>  SUBJECTIVE:  Sedated.  Continues to have fevers.  Tolerating TFs well. Neg balance 780 cc  VITAL SIGNS: Temp:  [99.3 F (37.4 C)-101.9 F (38.8 C)] 101.9 F (38.8 C) (02/11 0342) Pulse Rate:  [84-104] 96 (02/11 0500) Resp:  [15-36] 28 (02/11 0500) BP: (103-121)/(55-78) 112/68 mmHg (02/11 0400) SpO2:  [91 %-100 %] 93 % (02/11 0500) FiO2 (%):  [50 %] 50 % (02/11 0500) Weight:  [244 lb 4.3 oz (110.8 kg)-247 lb 12.8 oz (112.4 kg)] 244 lb 4.3 oz (110.8 kg) (02/11 0100) HEMODYNAMICS: CVP:  [10 mmHg-11 mmHg] 10 mmHg VENTILATOR SETTINGS: Vent Mode:  [-] PRVC FiO2 (%):  [50 %] 50 % Set Rate:  [32 bmp] 32 bmp Vt Set:  [450 mL] 450 mL PEEP:  [8 cmH20] 8 cmH20 Plateau Pressure:  [19 cmH20-27 cmH20] 26 cmH20 INTAKE / OUTPUT: Intake/Output     02/10 0701 - 02/11 0700   I.V. (mL/kg) 709.1 (6.4)   NG/GT 400   IV Piggyback 300   Total Intake(mL/kg) 1409.1 (12.7)   Urine (mL/kg/hr) 2340 (0.9)   Total Output 2340   Net -931         PHYSICAL  EXAMINATION: General: sedated, mittens in place Neuro:  sedated HEENT:  ETT in place Cardiovascular:  s1s2 regular, no murmur Lungs:  B/l rhochi; decreased in right base Abdomen:  Soft, non tender Musculoskeletal:  trace edema Skin:  No rashes  LABS:  Recent Labs Lab 07/08/12 1251  07/08/12 1325  07/09/12 0306  07/10/12 0400  07/10/12 0947 07/10/12 1659 07/11/12 0500 07/11/12 0532  HGB  --   < > 13.9  --  13.1  --  12.6*  --   --   --  11.9*  --   WBC  --   < > 3.6*  --  4.4  --  5.5  --   --   --  6.7  --   PLT  --   < > 116*  --  122*  --  163  --   --   --  182  --   NA  --   --  133*  --  131*  --  136  --   --   --   --   --   K  --   < > 3.8  --  3.9  --  3.9  --   --   --   --   --  CL  --   --  100  --  97  --  99  --   --   --   --   --   CO2  --   --  25  --  26  --  29  --   --   --   --   --   GLUCOSE  --   --  157*  --  113*  --  117*  --   --   --   --   --   BUN  --   --  11  --  8  --  12  --   --   --   --   --   CREATININE  --   --  0.95  --  1.04  --  1.02  --   --   --   --   --   CALCIUM  --   --  8.3*  --  7.9*  --  8.2*  --   --   --   --   --   AST  --   --  53*  --   --   --   --   --   --   --   --   --   ALT  --   --  44  --   --   --   --   --   --   --   --   --   ALKPHOS  --   --  73  --   --   --   --   --   --   --   --   --   BILITOT  --   --  0.2*  --   --   --   --   --   --   --   --   --   PROT  --   --  5.9*  --   --   --   --   --   --   --   --   --   ALBUMIN  --   --  2.9*  --   --   --   --   --   --   --   --   --   LATICACIDVEN 1.0  --   --   --   --   --   --   --   --   --   --   --   PHART  --   < >  --   < >  --   < >  --   < > 7.392 7.310*  --  7.363  PCO2ART  --   < >  --   < >  --   < >  --   < > 47.2* 56.7*  --  56.4*  PO2ART  --   < >  --   < >  --   < >  --   < > 64.0* 71.0*  --  70.0*  < > = values in this interval not displayed.  Recent Labs Lab 07/09/12 0205  GLUCAP 103*    Imaging: Dg Chest Port 1  View  07/10/2012  *RADIOLOGY REPORT*  Clinical Data: Pneumonia, evaluate endotracheal tube positioning  PORTABLE CHEST - 1 VIEW  Comparison: 07/09/2012; 07/07/2012; 01/06/2010  Findings: Interval advancement of endotracheal tube with  tip overlying tracheal air column approximately 2.1 cm above the carina.  Interval placement of a enteric tube with tip inside port projecting below the left hemidiaphragm.  Stable positioning of left jugular approach venous catheter with tip projected over the superior SVC. Minimal increase in extensive bilateral heterogeneous air space opacities, worse within the right upper and perihilar lung as well as within the peripheral aspect of the left mid lung. No definite pneumothorax or pleural effusion.  Unchanged bones.  IMPRESSION: 1.  Interval placement of enteric tube with tip and sideport projecting below the left hemidiaphragm. 2.  Remaining support apparatus appropriately positioned as above. No pneumothorax. 3.  Suspected worsening of bilateral air space opacities compatible with progression of of focal infection.   Original Report Authenticated By: Tacey Ruiz, MD    Dg Chest Port 1 View  07/09/2012  *RADIOLOGY REPORT*  Clinical Data: Endotracheal tube placement.  Central line placement.  Obstructive sleep apnea.  Hypertension.  PORTABLE CHEST - 1 VIEW  Comparison: 07/09/2012  Findings: Endotracheal tube tip 4.9 cm above the carina.  Left IJ line tip:  Upper SVC.  There is probably gas within the esophagus, causing the double contour along the trachea.  Pneumomediastinum is a less likely differential diagnostic consideration.  Low lung volumes noted with bilateral airspace opacities.  Right perihilar air bronchograms noted.  Slightly improved airspace opacity in the left midlung.  Heart size within normal limits for technique.  IMPRESSION:  1.  ET tube tip:  4.9 cm above carina. 2.  Left IJ line tip:  Upper SVC.  No pneumothorax. 3.  Slightly improved aeration on the left,  although significant bilateral airspace opacities persist. 4.  Low lung volumes.   Original Report Authenticated By: Gaylyn Rong, M.D.      ASSESSMENT / PLAN:  PULMONARY A: Acute respiratory failure 2nd to CAP. Hx of OSA ARDS P:   ARDS protocol, tolerated abg reviewed, maintain 6 cc/kg and current rate Goal to 40% then peep reduction pcxr in am  Goal to cvp 4, continue lasix Upright position, mobilize, ambulate as goal   CARDIOVASCULAR A: Hx of HTN. P:  Hold lisinopril for now, with crt rise cvp noted, continu elasix to cvp 4 goal F/u echo for pulm pressure-awaited kvo  RENAL A:  Slight rise bun / crt, neg balance P:   Monitor renal fx, urine outp Lasix 20mg  IV BID cvp trend, also need pa pressures to interpret  GASTROINTESTINAL A:  Nutrition. Hx of GERD. P:   continue TF, oxepa Protonix  HEMATOLOGIC A:  Thrombocytopenia - mild, improved, sepsis P:  F/u CBC SQ heparin for DVT proph  INFECTIOUS A:  CAP >> Influenza negative, but still high risk for influenza, continues to have temp (flu?) P:   Continue tamiflu high dose for 10 days, crt daily Continue vancomycin, levaquin, ceftazidime  F/u BAL cultures from bronch  ENDOCRINE A:  At risk hypeglycemia P:   Monitor blood sugars on BMET On TF  Add ssi  NEUROLOGIC A:  Anxiety. Vent dyschrony P:   Continue Wellbutrin, unless continued fevers at risk Ser syndrome fent drip WUA on propofol Consider MIND [place] assessment CAM neg  BOOTH, ERIN Family Medicine, PGY-2 07/11/2012, 5:53 AM  Ccm time 35 min   I have fully examined this patient and agree with above findings.    And edited in full  Mcarthur Rossetti. Tyson Alias, MD, FACP Pgr: [phone] [facility]

## 2012-07-11 NOTE — Progress Notes (Signed)
H1N1 pos   Continue Tamiflu

## 2012-07-12 ENCOUNTER — Inpatient Hospital Stay (HOSPITAL_COMMUNITY): Payer: 59

## 2012-07-12 LAB — CBC WITH DIFFERENTIAL/PLATELET
Basophils Relative: 1 % (ref 0–1)
Eosinophils Relative: 1 % (ref 0–5)
Lymphocytes Relative: 13 % (ref 12–46)
MCH: 30.6 pg (ref 26.0–34.0)
MCHC: 32.6 g/dL (ref 30.0–36.0)
MCV: 94 fL (ref 78.0–100.0)
Monocytes Relative: 21 % — ABNORMAL HIGH (ref 3–12)
Neutro Abs: 5.1 10*3/uL (ref 1.7–7.7)
Neutrophils Relative %: 64 % (ref 43–77)
RDW: 12.9 % (ref 11.5–15.5)
WBC: 8 10*3/uL (ref 4.0–10.5)

## 2012-07-12 LAB — BLOOD GAS, ARTERIAL
Acid-Base Excess: 6.2 mmol/L — ABNORMAL HIGH (ref 0.0–2.0)
Drawn by: 27407
FIO2: 0.4 %
MECHVT: 450 mL
MECHVT: 530 mL
O2 Saturation: 95.3 %
Patient temperature: 98.6
RATE: 32 resp/min
TCO2: 32.7 mmol/L (ref 0–100)
TCO2: 33 mmol/L (ref 0–100)
pCO2 arterial: 52.9 mmHg — ABNORMAL HIGH (ref 35.0–45.0)
pCO2 arterial: 50 mmHg — ABNORMAL HIGH (ref 35.0–45.0)
pH, Arterial: 7.417 (ref 7.350–7.450)
pO2, Arterial: 74.5 mmHg — ABNORMAL LOW (ref 80.0–100.0)

## 2012-07-12 LAB — MRSA CULTURE

## 2012-07-12 LAB — GLUCOSE, CAPILLARY
Glucose-Capillary: 141 mg/dL — ABNORMAL HIGH (ref 70–99)
Glucose-Capillary: 125 mg/dL — ABNORMAL HIGH (ref 70–99)
Glucose-Capillary: 138 mg/dL — ABNORMAL HIGH (ref 70–99)

## 2012-07-12 LAB — RENAL FUNCTION PANEL
Albumin: 2.5 g/dL — ABNORMAL LOW (ref 3.5–5.2)
BUN: 34 mg/dL — ABNORMAL HIGH (ref 6–23)
Phosphorus: 2.9 mg/dL (ref 2.3–4.6)
Potassium: 4 mEq/L (ref 3.5–5.1)

## 2012-07-12 LAB — TRIGLYCERIDES: Triglycerides: 296 mg/dL — ABNORMAL HIGH (ref <150)

## 2012-07-12 MED ORDER — FENTANYL CITRATE 0.05 MG/ML IJ SOLN
25.0000 ug | INTRAMUSCULAR | Status: DC | PRN
  Administered 2012-07-13 – 2012-07-15 (×8): 100 ug via INTRAVENOUS
  Filled 2012-07-12 (×10): qty 2

## 2012-07-12 MED ORDER — ONDANSETRON HCL 4 MG/2ML IJ SOLN
4.0000 mg | Freq: Three times a day (TID) | INTRAMUSCULAR | Status: DC | PRN
  Administered 2012-07-12 – 2012-07-17 (×2): 4 mg via INTRAVENOUS
  Filled 2012-07-12 (×2): qty 2

## 2012-07-12 MED ORDER — MIDAZOLAM HCL 2 MG/2ML IJ SOLN: 1.0000 mg | Freq: Once | INTRAMUSCULAR | Status: DC

## 2012-07-12 MED ORDER — FENTANYL CITRATE 0.05 MG/ML IJ SOLN
10.0000 ug/h | INTRAMUSCULAR | Status: DC
  Administered 2012-07-12: 10 ug/h via INTRAVENOUS
  Administered 2012-07-13: 150 ug/h via INTRAVENOUS
  Filled 2012-07-12 (×2): qty 50

## 2012-07-12 MED ORDER — MIDAZOLAM HCL 2 MG/2ML IJ SOLN
INTRAMUSCULAR | Status: AC
  Administered 2012-07-12: 1 mg
  Filled 2012-07-12: qty 2

## 2012-07-12 MED ORDER — MIDAZOLAM HCL 2 MG/2ML IJ SOLN
1.0000 mg | INTRAMUSCULAR | Status: DC | PRN
  Administered 2012-07-12 – 2012-07-13 (×6): 2 mg via INTRAVENOUS
  Administered 2012-07-14 (×2): 1 mg via INTRAVENOUS
  Filled 2012-07-12 (×7): qty 2

## 2012-07-12 MED ORDER — FENTANYL BOLUS VIA INFUSION: 25.0000 ug | INTRAVENOUS | Status: DC | PRN

## 2012-07-12 NOTE — Progress Notes (Addendum)
Physical Therapy Treatment Patient Details Name: Christopher Bowers MRN: [id number] DOB: 1965-06-30 Today's Date: 07/12/2012 Time: 4098-1191  PT Assessment / Plan / Recommendation Comments on Treatment Session  Spoke briefly with wife, Christopher Bowers, regarding home situation as pt could not answer questions yesterday and wife not in room.  Wife confirms they have three steps to get into home.  They can stay on bottom level.  Will have 24 hour care at home if needed as well.  Continue PT.                   Equipment Recommendations  Other (comment) (TBA)    Recommendations for Other Services Rehab consult      Christopher Bowers 07/12/2012, 11:26 AM [facility] [phone] (pager)

## 2012-07-12 NOTE — Progress Notes (Signed)
Physical Therapy Treatment Patient Details Name: Christopher Bowers MRN: [id number] DOB: 30-Aug-1965 Today's Date: 07/12/2012 Time: 6295-2841 PT Time Calculation (min): 47 min  PT Assessment / Plan / Recommendation Comments on Treatment Session  Pt s/p PNA with H1N1 with VDRF.  Will benefit from PT to address balance and endurance issues.  Pt making good progress thus far.  Agree with Rehab when medically stable.      Follow Up Recommendations  CIR;Supervision - Intermittent     Does the patient have the potential to tolerate intense rehabilitation   yes          Equipment Recommendations  Other (comment) (TBA)    Recommendations for Other Services Rehab consult  Frequency Min 3X/week   Plan Discharge plan remains appropriate;Frequency remains appropriate    Precautions / Restrictions Precautions Precautions: Fall Precaution Comments: on ventilator Restrictions Weight Bearing Restrictions: No   Pertinent Vitals/Pain See doc flowsheet, No pain    Mobility  Bed Mobility Bed Mobility: Rolling Left;Left Sidelying to Sit;Sitting - Scoot to [place] of Bed Rolling Left: 3: Mod assist;With rail Left Sidelying to Sit: 3: Mod assist;With rails;HOB flat Sitting - Scoot to Edge of Bed: 3: Mod assist Details for Bed Mobility Assistance: cues and assist for sequencing and technique, needed visual demonstration with delayed response questionably due to meds Transfers Transfers: Sit to Stand;Stand to Sit Sit to Stand: 1: +2 Total assist;From bed;With upper extremity assist Sit to Stand: Patient Percentage: 60% Stand to Sit: 1: +2 Total assist;With upper extremity assist;With armrests;To chair/3-in-1 Stand to Sit: Patient Percentage: 60% Stand Pivot Transfers: Not tested (comment) Details for Transfer Assistance: Nursing, RT, PT and rehab technician present to ambulate pt on ventilator.  RT placed pt on 100% FiO2 with PEEP at 5.  All VSS stable throughout treatment.  Pt needed steadying assist and  verbal as well as visual cues for sit to stand initiation.  Pt able to perform sit to stand with above assist.  Pt with very wide BOS and needed steadying assist.   Ambulation/Gait Ambulation/Gait Assistance: 1: +2 Total assist;Other (comment) (RT pushed vent, nursing IV, tech pushed chair behind pt) Ambulation/Gait: Patient Percentage: 70% Ambulation Distance (Feet): 38 Feet Assistive device: Rolling walker Ambulation/Gait Assistance Details: Initially, pt taking very small steps with widened BOS.  Pt also needed stability for postural stability at this time.  Provided tactile and verbal cuing for posture and to take larger steps and pt gradually began to ambulate with better gait pattern.  PT steered RW at all times as pt having a lot of difficulty.    Gait Pattern: Step-to pattern;Decreased stride length;Decreased step length - right;Decreased step length - left;Right steppage;Left steppage;Shuffle;Wide base of support Gait velocity: decreased Stairs: No Wheelchair Mobility Wheelchair Mobility: No    PT Goals Acute Rehab PT Goals PT Goal: Supine/Side to Sit - Progress: Progressing toward goal PT Goal: Sit at Edge Of Bed - Progress: Progressing toward goal PT Goal: Sit to Stand - Progress: Progressing toward goal PT Transfer Goal: Bed to Chair/Chair to Bed - Progress: Progressing toward goal PT Goal: Ambulate - Progress: Progressing toward goal  Visit Information  Last PT Received On: 07/12/12 Assistance Needed: +2    Subjective Data  Subjective: Pt intubated but nods head to yes no questions   Cognition  Cognition Overall Cognitive Status: Difficult to assess Difficult to assess due to: Intubated Arousal/Alertness: Lethargic (continues with intermittent lethargy) Orientation Level: Appears intact for tasks assessed Behavior During Session: Flat affect Cognition -  Other Comments: Most likely due to meds    Balance  Static Sitting Balance Static Sitting - Balance Support:  Bilateral upper extremity supported;Feet supported Static Sitting - Level of Assistance: 5: Stand by assistance Static Sitting - Comment/# of Minutes: 4 minutes, No LOB today Static Standing Balance Static Standing - Balance Support: Bilateral upper extremity supported;During functional activity Static Standing - Level of Assistance: 3: Mod assist Static Standing - Comment/# of Minutes: Needs steadying assist upon standing statically.  Still lethargic.  End of Session PT - End of Session Equipment Utilized During Treatment: Gait belt;Oxygen Activity Tolerance: Patient limited by fatigue Patient left: in chair;with call bell/phone within reach;with nursing in room Nurse Communication: Mobility status;Need for lift equipment;Other (comment) (left RW as nsg can stand pivot back to bed if desired)        INGOLD,Tikita Mabee 07/12/2012, 11:23 AM  [facility] [phone] (pager)

## 2012-07-12 NOTE — Progress Notes (Signed)
PULMONARY  / CRITICAL CARE MEDICINE  Name: Christopher Bowers MRN: [id number] DOB: [birth year]    ADMISSION DATE:  07/08/2012 CONSULTATION DATE:  07/08/2012  REFERRING MD :  Lilly Cove  CHIEF COMPLAINT:  Short of breath  BRIEF PATIENT DESCRIPTION:  47 yo male present to [facility] on 2/8 with dyspnea, cough, and fever (Tm 102.6) with chills/aches  2nd to b/l PNA.  Failed to improved after outpt tx for levaquin for 2 days.  He was transferred to [facility] 2/9 for further tx.  SIGNIFICANT EVENTS / STUDIES:  2/9 Transfer to [facility] 2/9- intubation  LINES / TUBES: ETT 2/9 >> R arterial line 2/9 >> L ij cvc 2/9 >> PIV  CULTURES: Blood 2/8 >>  HIV 2/8 >> Non reactive Influenza 2/8 >> negative Pneumococcal Ag 2/8 >> negative Legionella Ag 2/8 >> negative Resp viral panel 2/9 >> ET Aspirate 2/8 >> BAL 2/10 >>  ANTIBIOTICS: Zithromax 2/8 >>2/10 Rocephin 2/8 >>2/10 Vancomycin 2/8 >> Tamiflu 2/8 >>  Ceftazidime 2/10 >> Levofloxacin 2/10 >>  SUBJECTIVE:  Sedated.  Fever curve improving; influenza A positive.  Neg fluid balance.  Echo completed.  VITAL SIGNS: Temp:  [98 F (36.7 C)-100.4 F (38 C)] 99 F (37.2 C) (02/12 0432) Pulse Rate:  [78-98] 84 (02/12 0600) Resp:  [19-36] 32 (02/12 0600) BP: (104-137)/(55-87) 111/62 mmHg (02/12 0600) SpO2:  [91 %-99 %] 96 % (02/12 0600) FiO2 (%):  [40 %-50 %] 40 % (02/12 0608) Weight:  [241 lb 2.9 oz (109.4 kg)] 241 lb 2.9 oz (109.4 kg) (02/12 0500) HEMODYNAMICS: CVP:  [6 mmHg-12 mmHg] 10 mmHg VENTILATOR SETTINGS: Vent Mode:  [-] PRVC FiO2 (%):  [40 %-50 %] 40 % Set Rate:  [32 bmp] 32 bmp Vt Set:  [450 mL] 450 mL PEEP:  [5 cmH20-8 cmH20] 5 cmH20 Plateau Pressure:  [18 cmH20-24 cmH20] 21 cmH20 INTAKE / OUTPUT: Intake/Output     02/11 0701 - 02/12 0700 02/12 0701 - 02/13 0700   I.V. (mL/kg) 781.1 (7.1)    NG/GT 780    IV Piggyback 300    Total Intake(mL/kg) 1861.1 (17)    Urine (mL/kg/hr) 2550 (1)    Total Output 2550     Net -688.9             PHYSICAL EXAMINATION: General: sedated, mittens in place Neuro:  sedated HEENT:  ETT in place Cardiovascular:  s1s2 regular, no murmur Lungs:  B/l rhochi; decreased in right base Abdomen:  Soft, non tender Musculoskeletal:  trace edema Skin:  No rashes  LABS:  Recent Labs Lab 07/08/12 1251  07/08/12 1325  07/10/12 0400  07/10/12 1659 07/11/12 0500 07/11/12 0532 07/12/12 0436 07/12/12 0455 07/12/12 0500  HGB  --   --  13.9  < > 12.6*  --   --  11.9*  --   --   --  11.3*  WBC  --   --  3.6*  < > 5.5  --   --  6.7  --   --   --  8.0  PLT  --   --  116*  < > 163  --   --  182  --   --   --  228  NA  --   --  133*  < > 136  --   --  140  --   --  144  --   K  --   --  3.8  < > 3.9  --   --  4.0  --   --  4.0  --   CL  --   --  100  < > 99  --   --  100  --   --  102  --   CO2  --   --  25  < > 29  --   --  32  --   --  33*  --   GLUCOSE  --   --  157*  < > 117*  --   --  131*  --   --  150*  --   BUN  --   --  11  < > 12  --   --  25*  --   --  34*  --   CREATININE  --   --  0.95  < > 1.02  --   --  1.07  --   --  1.01  --   CALCIUM  --   --  8.3*  < > 8.2*  --   --  8.3*  --   --  8.5  --   PHOS  --   --   --   --   --   --   --   --   --   --  2.9  --   AST  --   --  53*  --   --   --   --   --   --   --   --   --   ALT  --   --  44  --   --   --   --   --   --   --   --   --   ALKPHOS  --   --  73  --   --   --   --   --   --   --   --   --   BILITOT  --   --  0.2*  --   --   --   --   --   --   --   --   --   PROT  --   --  5.9*  --   --   --   --   --   --   --   --   --   ALBUMIN  --   --  2.9*  --   --   --   --   --   --   --  2.5*  --   LATICACIDVEN 1.0  --   --   --   --   --   --   --   --   --   --   --   PHART  --   < >  --   < >  --   < > 7.310*  --  7.363 7.387  --   --   PCO2ART  --   < >  --   < >  --   < > 56.7*  --  56.4* 52.9*  --   --   PO2ART  --   < >  --   < >  --   < > 71.0*  --  70.0* 74.6*  --   --   < > = values in this interval not  displayed.  Recent Labs Lab 07/11/12 1216 07/11/12 1607 07/11/12 2021 07/11/12 2350 07/12/12 0428  GLUCAP  142* 144* 130* 127* 141*    Imaging: Dg Chest Port 1 View  07/11/2012  *RADIOLOGY REPORT*  Clinical Data: ARDS, pneumonia.  PORTABLE CHEST - 1 VIEW  Comparison: 07/10/2012  Findings: Support devices are unchanged.  Patchy bilateral airspace disease again noted, stable.  Low lung volumes.  No visible effusions.  IMPRESSION: No significant change.   Original Report Authenticated By: Charlett Nose, M.D.      ASSESSMENT / PLAN:  PULMONARY A: Acute respiratory failure 2nd to CAP. Hx of OSA ARDS P:   ARDS protocol, tolerated Goal to peep 5 successful Reduce rate 24, could consider slight liberalization to 7 cc/kg as plat are very low , in hopes to limit sedation and limit LOS vent Ensuring that plat do not rise greater 25/30, abg to follow after above Continue neg balance as able Mobilize, ambulate Wean today cpap5 ps 5, as goal 2 hrs cvp goal 4  CARDIOVASCULAR A: Hx of HTN. P:  Hold lisinopril for now, with crt rise cvp noted, continu elasix to cvp 4 goal unable to accurately assess PA pressures on echo. kvo  RENAL A:  Slight rise bun / crt, neg balance P:   Monitor renal fx, urine outp Lasix 20mg  IV BID cvp trend  GASTROINTESTINAL A:  Nutrition. Hx of GERD. P:   continue TF, oxepa Protonix zofran addition  HEMATOLOGIC A:  Thrombocytopenia - mild, improved, sepsis P:  F/u CBC SQ heparin for DVT proph  INFECTIOUS A:  CAP >> Influenza A positive; cultures otherwise negative thus far P:   Continue tamiflu high dose for 10 days, crt daily Dc vanc, levofloxacin Continue ceftazidime  F/u BAL cultures from bronch  ENDOCRINE A:  At risk hypeglycemia P:   ssi  NEUROLOGIC A:  Anxiety. Vent dyschrony P:   fent drip to intermittent WUA on propofol MIND [place] - enrolled CAM neg Ambulation as goal  BOOTH, ERIN Family Medicine, PGY-2 07/12/2012, 7:23  AM  Ccm time 30 min   I have fully examined this patient and agree with above findings.    And edited in full  Mcarthur Rossetti. Tyson Alias, MD, FACP Pgr: [phone] [facility]

## 2012-07-13 LAB — POCT I-STAT 3, VENOUS BLOOD GAS (G3P V)
Bicarbonate: 38.7 mEq/L — ABNORMAL HIGH (ref 20.0–24.0)
pCO2, Ven: 54.2 mmHg — ABNORMAL HIGH (ref 45.0–50.0)
pH, Ven: 7.462 — ABNORMAL HIGH (ref 7.250–7.300)
pO2, Ven: 72 mmHg — ABNORMAL HIGH (ref 30.0–45.0)

## 2012-07-13 LAB — CULTURE, BAL-QUANTITATIVE W GRAM STAIN: Culture: NO GROWTH

## 2012-07-13 LAB — GLUCOSE, CAPILLARY
Glucose-Capillary: 134 mg/dL — ABNORMAL HIGH (ref 70–99)
Glucose-Capillary: 141 mg/dL — ABNORMAL HIGH (ref 70–99)
Glucose-Capillary: 147 mg/dL — ABNORMAL HIGH (ref 70–99)

## 2012-07-13 LAB — CBC
HCT: 34.6 % — ABNORMAL LOW (ref 39.0–52.0)
MCV: 95.6 fL (ref 78.0–100.0)
RBC: 3.62 MIL/uL — ABNORMAL LOW (ref 4.22–5.81)
WBC: 8.8 10*3/uL (ref 4.0–10.5)

## 2012-07-13 LAB — POCT I-STAT 3, ART BLOOD GAS (G3+)
Acid-Base Excess: 14 mmol/L — ABNORMAL HIGH (ref 0.0–2.0)
Bicarbonate: 40 mEq/L — ABNORMAL HIGH (ref 20.0–24.0)
O2 Saturation: 95 %
TCO2: 42 mmol/L (ref 0–100)
pCO2 arterial: 58.1 mmHg (ref 35.0–45.0)
pO2, Arterial: 79 mmHg — ABNORMAL LOW (ref 80.0–100.0)

## 2012-07-13 LAB — BASIC METABOLIC PANEL
BUN: 36 mg/dL — ABNORMAL HIGH (ref 6–23)
CO2: 37 mEq/L — ABNORMAL HIGH (ref 19–32)
CO2: 36 mEq/L — ABNORMAL HIGH (ref 19–32)
Chloride: 104 mEq/L (ref 96–112)
Chloride: 105 mEq/L (ref 96–112)
Creatinine, Ser: 0.98 mg/dL (ref 0.50–1.35)
Glucose, Bld: 139 mg/dL — ABNORMAL HIGH (ref 70–99)
Sodium: 149 mEq/L — ABNORMAL HIGH (ref 135–145)

## 2012-07-13 MED ORDER — FENTANYL CITRATE 0.05 MG/ML IJ SOLN: 25.0000 ug | INTRAMUSCULAR | Status: DC | PRN

## 2012-07-13 MED ORDER — DEXMEDETOMIDINE HCL IN NACL 200 MCG/50ML IV SOLN
0.2000 ug/kg/h | INTRAVENOUS | Status: DC
  Administered 2012-07-13: 0.8 ug/kg/h via INTRAVENOUS
  Administered 2012-07-13: 1 ug/kg/h via INTRAVENOUS
  Administered 2012-07-13: 0.2 ug/kg/h via INTRAVENOUS
  Filled 2012-07-13 (×2): qty 50
  Filled 2012-07-13: qty 100

## 2012-07-13 MED ORDER — FREE WATER
200.0000 mL | Status: DC
  Administered 2012-07-13 – 2012-07-15 (×12): 200 mL

## 2012-07-13 MED ORDER — DEXTROSE 5 % IV SOLN
1.0000 g | INTRAVENOUS | Status: AC
  Administered 2012-07-13 – 2012-07-15 (×3): 1 g via INTRAVENOUS
  Filled 2012-07-13 (×3): qty 10

## 2012-07-13 MED ORDER — MIDAZOLAM HCL 2 MG/2ML IJ SOLN
2.0000 mg | INTRAMUSCULAR | Status: DC | PRN
  Administered 2012-07-14 – 2012-07-15 (×2): 4 mg via INTRAVENOUS
  Filled 2012-07-13 (×2): qty 4

## 2012-07-13 MED ORDER — LACTULOSE 10 GM/15ML PO SOLN
30.0000 g | Freq: Once | ORAL | Status: AC
  Administered 2012-07-13: 30 g
  Filled 2012-07-13: qty 45

## 2012-07-13 MED ORDER — DEXMEDETOMIDINE HCL IN NACL 400 MCG/100ML IV SOLN
0.4000 ug/kg/h | INTRAVENOUS | Status: DC
  Administered 2012-07-13: 1 ug/kg/h via INTRAVENOUS
  Administered 2012-07-13 – 2012-07-14 (×5): 1.2 ug/kg/h via INTRAVENOUS
  Filled 2012-07-13 (×8): qty 100

## 2012-07-13 NOTE — Progress Notes (Signed)
PT Cancellation Note  Patient Details Name: Christopher Bowers MRN: [id number] DOB: August 14, 1965   Cancelled Treatment:    Reason Eval/Treat Not Completed: Medical issues which prohibited therapy;Other (comment) (Pt not feeling well.  On vent due to failed wean.  Nsg asked PT to check with patient and patient declined therapy.  Wife present and agrees pt is fatigued.  Will reattempt at later date.)  Thanks.   INGOLD,Jamieson Lisa 07/13/2012, 11:50 AM [facility] [phone] (pager)

## 2012-07-13 NOTE — Progress Notes (Signed)
PULMONARY  / CRITICAL CARE MEDICINE  Name: Christopher Bowers MRN: [id number] DOB: 03-Mar-1966    ADMISSION DATE:  07/08/2012 CONSULTATION DATE:  07/08/2012  REFERRING MD :  Lilly Cove  CHIEF COMPLAINT:  Short of breath  BRIEF PATIENT DESCRIPTION:  47 yo male present to [facility] on 2/8 with dyspnea, cough, and fever (Tm 102.6) with chills/aches  2nd to b/l PNA.  Failed to improved after outpt tx for levaquin for 2 days.  He was transferred to [facility] 2/9 for further tx.  SIGNIFICANT EVENTS / STUDIES:  2/9 Transfer to [facility] 2/9- intubation  LINES / TUBES: ETT 2/9 >> R arterial line 2/9 >> L ij cvc 2/9 >> PIV  CULTURES: Blood 2/8 >>  HIV 2/8 >> Non reactive Influenza 2/8 >> negative Pneumococcal Ag 2/8 >> negative Legionella Ag 2/8 >> negative Resp viral panel 2/9 >> Influenza A  ET Aspirate 2/8 >> negative BAL 2/10 >>  ANTIBIOTICS: Zithromax 2/8 >>2/10 Rocephin 2/8 >>2/10 Vancomycin 2/8 >>2/12 Tamiflu 2/8 >>  Ceftazidime 2/10 >> Levofloxacin 2/10 >>2/12  SUBJECTIVE:  Afebrile.  Neg fluid balance.  Gets very anxious.  No pain complaints.  VITAL SIGNS: Temp:  [99 F (37.2 C)-99.1 F (37.3 C)] 99.1 F (37.3 C) (02/12 1524) Pulse Rate:  [88-103] 88 (02/13 0700) Resp:  [17-32] 24 (02/13 0700) BP: (102-155)/(66-92) 128/75 mmHg (02/13 0700) SpO2:  [93 %-99 %] 95 % (02/13 0700) FiO2 (%):  [40 %-100 %] 40 % (02/13 0436) HEMODYNAMICS: CVP:  [5 mmHg-7 mmHg] 6 mmHg VENTILATOR SETTINGS: Vent Mode:  [-] PRVC FiO2 (%):  [40 %-100 %] 40 % Set Rate:  [24 bmp-32 bmp] 24 bmp Vt Set:  [450 mL-530 mL] 530 mL PEEP:  [5 cmH20] 5 cmH20 Plateau Pressure:  [19 cmH20-22 cmH20] 21 cmH20 INTAKE / OUTPUT: Intake/Output     02/12 0701 - 02/13 0700 02/13 0701 - 02/14 0700   I.V. (mL/kg) 592 (5.4)    NG/GT 660    IV Piggyback     Total Intake(mL/kg) 1252 (11.4)    Urine (mL/kg/hr) 2700 (1)    Total Output 2700     Net -1448            PHYSICAL EXAMINATION: General: alert, intermittently  agitated Neuro:  Alert, follows commands HEENT:  ETT in place Cardiovascular:  s1s2 regular, no murmur Lungs:  Scattered rhonchi; decreased in bilateral bases Abdomen:  Soft, non tender Musculoskeletal:  trace edema Skin:  No rashes  LABS:  Recent Labs Lab 07/08/12 1251  07/08/12 1325  07/11/12 0500 07/11/12 0532 07/12/12 0436 07/12/12 0455 07/12/12 0500 07/12/12 1100 07/13/12 0515  HGB  --   --  13.9  < > 11.9*  --   --   --  11.3*  --  11.0*  WBC  --   --  3.6*  < > 6.7  --   --   --  8.0  --  8.8  PLT  --   --  116*  < > 182  --   --   --  228  --  310  NA  --   --  133*  < > 140  --   --  144  --   --  148*  K  --   --  3.8  < > 4.0  --   --  4.0  --   --  3.5  CL  --   --  100  < > 100  --   --  102  --   --  104  CO2  --   --  25  < > 32  --   --  33*  --   --  37*  GLUCOSE  --   --  157*  < > 131*  --   --  150*  --   --  139*  BUN  --   --  11  < > 25*  --   --  34*  --   --  36*  CREATININE  --   --  0.95  < > 1.07  --   --  1.01  --   --  0.98  CALCIUM  --   --  8.3*  < > 8.3*  --   --  8.5  --   --  8.7  PHOS  --   --   --   --   --   --   --  2.9  --   --   --   AST  --   --  53*  --   --   --   --   --   --   --   --   ALT  --   --  44  --   --   --   --   --   --   --   --   ALKPHOS  --   --  73  --   --   --   --   --   --   --   --   BILITOT  --   --  0.2*  --   --   --   --   --   --   --   --   PROT  --   --  5.9*  --   --   --   --   --   --   --   --   ALBUMIN  --   --  2.9*  --   --   --   --  2.5*  --   --   --   LATICACIDVEN 1.0  --   --   --   --   --   --   --   --   --   --   PHART  --   < >  --   < >  --  7.363 7.387  --   --  7.417  --   PCO2ART  --   < >  --   < >  --  56.4* 52.9*  --   --  50.0*  --   PO2ART  --   < >  --   < >  --  70.0* 74.6*  --   --  74.5*  --   < > = values in this interval not displayed.  Recent Labs Lab 07/12/12 1124 07/12/12 1508 07/12/12 2115 07/13/12 0038 07/13/12 0513  GLUCAP 138* 138* 136* 142* 112*     Imaging: Dg Chest Port 1 View  07/12/2012  *RADIOLOGY REPORT*  Clinical Data: There D S, pneumonia, endotracheal tube  PORTABLE CHEST - 1 VIEW  Comparison: 07/11/2012; 07/10/2012; 07/09/2012; 07/07/2012  Findings: Grossly unchanged cardiac silhouette and mediastinal contours given patient rotation to the left.  Grossly unchanged bilateral heterogeneous air space opacities, worse within the right upper and left lower lungs.  No new focal airspace opacity.  No definite  pleural effusion or pneumothorax.  Unchanged bones.  IMPRESSION: 1.  Stable positioning of support apparatus.  No pneumothorax. 2.  Grossly unchanged findings of multifocal infection.   Original Report Authenticated By: Tacey Ruiz, MD      ASSESSMENT / PLAN:  PULMONARY A: Acute respiratory failure 2nd to CAP. Hx of OSA ARDS P:   ARDS protocol, tolerated Continue neg balance as able again next 24 hrs Mobilize, ambulate Wean today cpap5 ps 5, as goal 2 hrs- failed cvp goal 4 Ambulated 2/13 Failed weaning with HTN, assess ecg, trop  CARDIOVASCULAR A: Hx of HTN. HTn with wean P:  Hold lisinopril further cvp noted, continue lasix to cvp 4 goal unable to accurately assess PA pressures on echo. kvo Ecg, trop  RENAL A:  Slight rise bun / crt, neg balance, mild hypernatremia P:   Monitor renal fx, urine outp Lasix 20mg  IV BID cvp trend Start free water  GASTROINTESTINAL A:  Nutrition. Hx of GERD. P:   continue TF, oxepa Protonix zofran prn BM needed, redose lactulose  HEMATOLOGIC A:  Thrombocytopenia - mild due to sepsis, resolved P:  SQ heparin for DVT proph  INFECTIOUS A:  CAP >> Influenza A positive; cultures otherwise negative thus far P:   Continue tamiflu high dose for 10 days, crt daily Dc ceftazidime, add ceftriaxone to toal 8 days F/u BAL cultures from bronch  ENDOCRINE A:  At risk hypeglycemia P:   ssi  NEUROLOGIC A:  Anxiety. Vent dyschrony P:   fent drip to  intermittent Consider precedex MIND [place] - enrolled CAM neg Ambulation as goal, met  BOOTH, ERIN Family Medicine, PGY-2 07/13/2012, 7:22 AM  Ccm time 30 min   I have fully examined this patient and agree with above findings.    And edited in full  Mcarthur Rossetti. Tyson Alias, MD, FACP Pgr: [phone] [facility]

## 2012-07-13 NOTE — Progress Notes (Signed)
UR Completed.  Tyquez Hollibaugh Jane [phone] 07/13/2012  

## 2012-07-13 NOTE — Progress Notes (Signed)
Pt placed back on full support due to increased work of breathing, heart rate, respiratory rate, and blood pressure.  Pt also complaining that he can not breath.  Pt looks much more comfortable back on full support, rt will continue to monitor.

## 2012-07-14 ENCOUNTER — Inpatient Hospital Stay (HOSPITAL_COMMUNITY): Payer: 59

## 2012-07-14 LAB — GLUCOSE, CAPILLARY
Glucose-Capillary: 134 mg/dL — ABNORMAL HIGH (ref 70–99)
Glucose-Capillary: 133 mg/dL — ABNORMAL HIGH (ref 70–99)
Glucose-Capillary: 125 mg/dL — ABNORMAL HIGH (ref 70–99)

## 2012-07-14 LAB — URINE MICROSCOPIC-ADD ON

## 2012-07-14 LAB — URINALYSIS, ROUTINE W REFLEX MICROSCOPIC
Bilirubin Urine: NEGATIVE
Glucose, UA: NEGATIVE mg/dL
Specific Gravity, Urine: 1.01 (ref 1.005–1.030)
Urobilinogen, UA: 1 mg/dL (ref 0.0–1.0)

## 2012-07-14 LAB — CULTURE, BLOOD (ROUTINE X 2)
Culture: NO GROWTH
Culture: NO GROWTH

## 2012-07-14 LAB — BASIC METABOLIC PANEL
BUN: 39 mg/dL — ABNORMAL HIGH (ref 6–23)
Creatinine, Ser: 1.04 mg/dL (ref 0.50–1.35)
GFR calc Af Amer: >90 mL/min (ref >90)
GFR calc non Af Amer: 84 mL/min — ABNORMAL LOW (ref >90)
Glucose, Bld: 145 mg/dL — ABNORMAL HIGH (ref 70–99)

## 2012-07-14 LAB — TROPONIN I: Troponin I: <0.3 ng/mL (ref <0.30)

## 2012-07-14 MED ORDER — PROPOFOL 10 MG/ML IV EMUL
5.0000 ug/kg/min | INTRAVENOUS | Status: DC
  Administered 2012-07-14: 70 ug/kg/min via INTRAVENOUS
  Administered 2012-07-14: 40 ug/kg/min via INTRAVENOUS
  Administered 2012-07-14: 20 ug/kg/min via INTRAVENOUS
  Administered 2012-07-14 – 2012-07-15 (×7): 70 ug/kg/min via INTRAVENOUS
  Filled 2012-07-14 (×7): qty 100
  Filled 2012-07-14: qty 200
  Filled 2012-07-14 (×2): qty 100

## 2012-07-14 MED ORDER — POLYETHYLENE GLYCOL 3350 17 G PO PACK
17.0000 g | PACK | Freq: Every day | ORAL | Status: DC | PRN
  Administered 2012-07-14: 17 g via ORAL
  Filled 2012-07-14: qty 1

## 2012-07-14 MED ORDER — MIDAZOLAM HCL 2 MG/2ML IJ SOLN
INTRAMUSCULAR | Status: AC
  Filled 2012-07-14: qty 2

## 2012-07-14 MED ORDER — BISACODYL 10 MG RE SUPP
10.0000 mg | RECTAL | Status: AC
  Administered 2012-07-14: 10 mg via RECTAL

## 2012-07-14 MED ORDER — HYDRALAZINE HCL 25 MG PO TABS
25.0000 mg | ORAL_TABLET | Freq: Three times a day (TID) | ORAL | Status: DC
  Administered 2012-07-14 – 2012-07-15 (×3): 25 mg via ORAL
  Filled 2012-07-14 (×6): qty 1

## 2012-07-14 MED ORDER — POLYETHYLENE GLYCOL 3350 17 G PO PACK
17.0000 g | PACK | Freq: Two times a day (BID) | ORAL | Status: DC | PRN
  Filled 2012-07-14: qty 1

## 2012-07-14 NOTE — Progress Notes (Signed)
NUTRITION FOLLOW UP  Intervention:   If unable to reduce rate of propofol, may need to adjust TF.  Will continue Oxepa at 30 ml/h with Prostat 60 ml QID for now.   Nutrition Dx:   Inadequate oral intake related to inability to eat as evidenced by NPO status. Ongoing.  Goal:   Enteral nutrition to provide 60-70% of estimated calorie needs (22-25 kcals/kg ideal body weight) and >/= 90% of estimated protein needs, based on ASPEN guidelines for permissive underfeeding in critically ill obese individuals. Met.  Monitor:   TF tolerance/adequacy, weight trend, labs, vent status.  Assessment:   Patient remains intubated on ventilator support.  ARDS protocol in place. Precedex has been started.  Noted plans to reduce sedation. MV: 13.1 Temp:Temp (24hrs), Avg:99.7 F (37.6 C), Min:98.6 F (37 C), Max:102.2 F (39 C)  Propofol: 26.3 ml/hr providing 694 kcals/day   Height: Ht Readings from Last 1 Encounters:  07/09/12 5\' 11"  (1.803 m)    Weight Status:  Down slightly Wt Readings from Last 1 Encounters:  07/14/12 241 lb 13.5 oz (109.7 kg)  07/10/12  247 lb 12.8 oz (112.4 kg)    Re-estimated needs:  Kcal: 2500 (underfeeding goal: 1720-1955 kcals) Protein: > 155 gm Fluid: 2.5 L  Skin: no problems noted  Diet Order: NPO; Oxepa at 30 ml/h with Prostat 60 ml QID to provide 1880 kcals (24 kcals/kg ideal weight), 165 gm protein, 565 ml free water daily.    Intake/Output Summary (Last 24 hours) at 07/14/12 1452 Last data filed at 07/14/12 1200  Gross per 24 hour  Intake 2307.14 ml  Output   2065 ml  Net 242.14 ml    Last BM: 2/6; plans for abdominal film if no BM today.  Miralax has been added.  Lactulose was not beneficial.     Labs:   Recent Labs Lab 07/11/12 0500 07/12/12 0455 07/13/12 0515 07/13/12 1130 07/14/12 0830  NA 140 144 148* 149* 149*  K 4.0 4.0 3.5 3.8 3.6  CL 100 102 104 105 107  CO2 32 33* 37* 36* 33*  BUN 25* 34* 36* 34* 39*  CREATININE 1.07 1.01  0.98 0.94 1.04  CALCIUM 8.3* 8.5 8.7 8.7 8.8  PHOS  --  2.9  --   --   --   GLUCOSE 131* 150* 139* 138* 145*    CBG (last 3)   Recent Labs  07/14/12 0345 07/14/12 0824 07/14/12 1239  GLUCAP 134* 137* 131*    Scheduled Meds: . albuterol  6 puff Inhalation Q6H  . antiseptic oral rinse  15 mL Mouth Rinse QID  . cefTRIAXone (ROCEPHIN)  IV  1 g Intravenous Q24H  . chlorhexidine  15 mL Mouth Rinse BID  . feeding supplement (OXEPA)  1,000 mL Per Tube Q24H  . feeding supplement  60 mL Per Tube QID  . free water  200 mL Per Tube Q4H  . heparin  5,000 Units Subcutaneous Q8H  . hydrALAZINE  25 mg Oral Q8H  . insulin aspart  0-9 Units Subcutaneous Q4H  . oseltamivir  150 mg Per Tube BID  . pantoprazole sodium  40 mg Per Tube Q24H    Continuous Infusions: . sodium chloride 10 mL/hr at 07/13/12 0000  . propofol 40 mcg/kg/min (07/14/12 1443)    Joaquin Courts, RD, LDN, CNSC Pager# [phone] After Hours Pager# [phone]

## 2012-07-14 NOTE — Progress Notes (Signed)
PT Cancellation Note  Patient Details Name: Christopher Bowers MRN: [id number] DOB: [birth year]   Cancelled Treatment:    Reason Eval/Treat Not Completed: Medical issues which prohibited therapy;Other (comment) (Agitated per nursing, asked to HOLD PT)   INGOLD,Clorine Swing 07/14/2012, 11:42 AM [facility] [phone] (pager)

## 2012-07-14 NOTE — Progress Notes (Signed)
PULMONARY  / CRITICAL CARE MEDICINE  Name: Christopher Bowers MRN: [id number] DOB: 1965-06-05    ADMISSION DATE:  07/08/2012 CONSULTATION DATE:  07/08/2012  REFERRING MD :  Lilly Cove  CHIEF COMPLAINT:  Short of breath  BRIEF PATIENT DESCRIPTION:  47 yo male present to [facility] on 2/8 with dyspnea, cough, and fever (Tm 102.6) with chills/aches  2nd to b/l PNA.  Failed to improved after outpt tx for levaquin for 2 days.  He was transferred to [facility] 2/9 for further tx.  SIGNIFICANT EVENTS / STUDIES:  2/9 Transfer to [facility] 2/9- intubation 2/12 - ambulated ICU 2/13- precedex started  LINES / TUBES: ETT 2/9 >> R arterial line 2/9 >> L ij cvc 2/9 >> PIV  CULTURES: Blood 2/8 >> negative HIV 2/8 >> Non reactive Influenza 2/8 >> negative Pneumococcal Ag 2/8 >> negative Legionella Ag 2/8 >> negative Resp viral panel 2/9 >> Influenza A  ET Aspirate 2/8 >> negative BAL 2/10 >> negative  ANTIBIOTICS: Zithromax 2/8 >>2/10 Rocephin 2/8 >>2/10 Vancomycin 2/8 >>2/12 Tamiflu 2/8 >> plan stop 18th Ceftazidime 2/10 >>2/13 Levofloxacin 2/10 >>2/12 Ceftriaxone 2/13 >>plan stop 18th  SUBJECTIVE:  Febrile this morning.  No BM.  RN reports agitation, requiring prn fentanyl every hour.  VITAL SIGNS: Temp:  [98.6 F (37 C)-101.1 F (38.4 C)] 98.6 F (37 C) (02/14 0400) Pulse Rate:  [74-104] 82 (02/14 0700) Resp:  [22-29] 22 (02/14 0700) BP: (114-196)/(67-105) 160/105 mmHg (02/14 0700) SpO2:  [90 %-96 %] 96 % (02/14 0700) FiO2 (%):  [40 %] 40 % (02/14 0340) Weight:  [241 lb 13.5 oz (109.7 kg)] 241 lb 13.5 oz (109.7 kg) (02/14 0400) HEMODYNAMICS: CVP:  [11 mmHg-29 mmHg] 15 mmHg VENTILATOR SETTINGS: Vent Mode:  [-] PRVC FiO2 (%):  [40 %] 40 % Set Rate:  [24 bmp] 24 bmp Vt Set:  [530 mL] 530 mL PEEP:  [5 cmH20] 5 cmH20 Pressure Support:  [5 cmH20] 5 cmH20 Plateau Pressure:  [16 cmH20-24 cmH20] 24 cmH20 INTAKE / OUTPUT: Intake/Output     02/13 0701 - 02/14 0700 02/14 0701 - 02/15 0700   I.V.  (mL/kg) 846 (7.7)    NG/GT 690    IV Piggyback 50    Total Intake(mL/kg) 1586 (14.5)    Urine (mL/kg/hr) 2425 (0.9)    Total Output 2425     Net -839            PHYSICAL EXAMINATION: General: alert, intermittently agitated Neuro:  Alert, follows commands HEENT:  ETT in place Cardiovascular:  s1s2 regular, no murmur Lungs:  Scattered rhonchi; decreased in bilateral bases Abdomen:  +BS, high pitched, distended, firm Musculoskeletal: no edema Skin:  No rashes  LABS:  Recent Labs Lab 07/08/12 1251  07/08/12 1325  07/11/12 0500  07/12/12 0436 07/12/12 0455 07/12/12 0500 07/12/12 1100 07/13/12 0515 07/13/12 1114 07/13/12 1130 07/13/12 1206 07/13/12 1714 07/13/12 2340  HGB  --   --  13.9  < > 11.9*  --   --   --  11.3*  --  11.0*  --   --   --   --   --   WBC  --   --  3.6*  < > 6.7  --   --   --  8.0  --  8.8  --   --   --   --   --   PLT  --   --  116*  < > 182  --   --   --  228  --  310  --   --   --   --   --   NA  --   --  133*  < > 140  --   --  144  --   --  148*  --  149*  --   --   --   K  --   --  3.8  < > 4.0  --   --  4.0  --   --  3.5  --  3.8  --   --   --   CL  --   --  100  < > 100  --   --  102  --   --  104  --  105  --   --   --   CO2  --   --  25  < > 32  --   --  33*  --   --  37*  --  36*  --   --   --   GLUCOSE  --   --  157*  < > 131*  --   --  150*  --   --  139*  --  138*  --   --   --   BUN  --   --  11  < > 25*  --   --  34*  --   --  36*  --  34*  --   --   --   CREATININE  --   --  0.95  < > 1.07  --   --  1.01  --   --  0.98  --  0.94  --   --   --   CALCIUM  --   --  8.3*  < > 8.3*  --   --  8.5  --   --  8.7  --  8.7  --   --   --   PHOS  --   --   --   --   --   --   --  2.9  --   --   --   --   --   --   --   --   AST  --   --  53*  --   --   --   --   --   --   --   --   --   --   --   --   --   ALT  --   --  44  --   --   --   --   --   --   --   --   --   --   --   --   --   ALKPHOS  --   --  73  --   --   --   --   --   --   --   --    --   --   --   --   --   BILITOT  --   --  0.2*  --   --   --   --   --   --   --   --   --   --   --   --   --   PROT  --   --  5.9*  --   --   --   --   --   --   --   --   --   --   --   --   --  ALBUMIN  --   --  2.9*  --   --   --   --  2.5*  --   --   --   --   --   --   --   --   LATICACIDVEN 1.0  --   --   --   --   --   --   --   --   --   --   --   --   --   --   --   TROPONINI  --   --   --   --   --   --   --   --   --   --   --  <0.30  --   --  <0.30 <0.30  PHART  --   < >  --   < >  --   < > 7.387  --   --  7.417  --   --   --  7.449  --   --   PCO2ART  --   < >  --   < >  --   < > 52.9*  --   --  50.0*  --   --   --  58.1*  --   --   PO2ART  --   < >  --   < >  --   < > 74.6*  --   --  74.5*  --   --   --  79.0*  --   --   < > = values in this interval not displayed.  Recent Labs Lab 07/13/12 1209 07/13/12 1622 07/13/12 2014 07/13/12 2308 07/14/12 0345  GLUCAP 140* 141* 147* 134* 134*    Imaging: No results found.   ASSESSMENT / PLAN:  PULMONARY A: Acute respiratory failure 2nd to Flu/CAP. Hx of OSA ARDS P:   ARDS protocol, tolerated Continue neg balance as able again next 24 hrs, goal 500 cc Mobilize, ambulate when sedation needs reduced Wean today cpap5 ps 5, as goal 2 hrs, assess rsbi, can extubate on precedex cvp goal 4, no tmet, continue lasix Ambulated 2/12, 13 pcxr now  CARDIOVASCULAR A: Hx of HTN HTN with wean - ecg and trop negative P:  Hold lisinopril further, slight crt rise cvp noted, continue lasix to cvp 4 goal Add hydralazine unable to accurately assess PA pressures on echo. kvo  RENAL A:  Slight rise bun / crt, neg balance, mild hypernatremia P:   Monitor renal fx, urine outp Lasix 20mg  IV x 1, goal 500 cc neg cvp trend continue free water  GASTROINTESTINAL A:  Nutrition. Hx of GERD. Constipation (is passing gas) P:   continue TF, oxepa (ARDS) Protonix zofran prn BM needed, doculax supp this am Add myrlax, failed  lactulose Consider abdominal film if no BM today  HEMATOLOGIC A:  Thrombocytopenia - mild due to sepsis, resolved P:  SQ heparin for DVT proph  INFECTIOUS A:  CAP >> Influenza A positive; cultures negative, fever 2/14 P:   Continue tamiflu high dose for 10 days, crt daily ceftriaxone to total 8 days Fever, repeat UA, BC  ENDOCRINE A:  At risk hypeglycemia P:   ssi  NEUROLOGIC A:  Anxiety. Vent dyschrony P:   precedex Fentanyl and versed prn MIND [place] - enrolled Ambulation as goal, met, re assess option with lower precedex needs  BOOTH, ERIN Family Medicine, PGY-2 07/14/2012, 7:24 AM  Ccm time 30 min  I have fully examined this patient and agree with above findings.    And edited in full  Lavon Paganini. Titus Mould, MD, [place] Pgr: [facility]

## 2012-07-15 ENCOUNTER — Inpatient Hospital Stay (HOSPITAL_COMMUNITY): Payer: 59

## 2012-07-15 DIAGNOSIS — J9589 Other postprocedural complications and disorders of respiratory system, not elsewhere classified: Secondary | ICD-10-CM

## 2012-07-15 DIAGNOSIS — J80 Acute respiratory distress syndrome: Secondary | ICD-10-CM

## 2012-07-15 DIAGNOSIS — J101 Influenza due to other identified influenza virus with other respiratory manifestations: Secondary | ICD-10-CM

## 2012-07-15 LAB — GLUCOSE, CAPILLARY
Glucose-Capillary: 115 mg/dL — ABNORMAL HIGH (ref 70–99)
Glucose-Capillary: 116 mg/dL — ABNORMAL HIGH (ref 70–99)
Glucose-Capillary: 78 mg/dL (ref 70–99)
Glucose-Capillary: 85 mg/dL (ref 70–99)

## 2012-07-15 LAB — BASIC METABOLIC PANEL
GFR calc Af Amer: >90 mL/min (ref >90)
GFR calc non Af Amer: >90 mL/min (ref >90)
Glucose, Bld: 132 mg/dL — ABNORMAL HIGH (ref 70–99)
Potassium: 3.4 mEq/L — ABNORMAL LOW (ref 3.5–5.1)
Sodium: 147 mEq/L — ABNORMAL HIGH (ref 135–145)

## 2012-07-15 LAB — TRIGLYCERIDES: Triglycerides: 384 mg/dL — ABNORMAL HIGH (ref <150)

## 2012-07-15 MED ORDER — FLUTICASONE PROPIONATE 50 MCG/ACT NA SUSP
1.0000 | Freq: Every day | NASAL | Status: DC
  Administered 2012-07-15 – 2012-07-19 (×4): 1 via NASAL
  Filled 2012-07-15: qty 16

## 2012-07-15 MED ORDER — ACETAMINOPHEN 160 MG/5ML PO SOLN
650.0000 mg | Freq: Four times a day (QID) | ORAL | Status: DC | PRN
  Administered 2012-07-15 – 2012-07-16 (×2): 650 mg via ORAL
  Filled 2012-07-15 (×2): qty 20.3

## 2012-07-15 MED ORDER — HYDRALAZINE HCL 10 MG PO TABS
10.0000 mg | ORAL_TABLET | Freq: Three times a day (TID) | ORAL | Status: DC
  Filled 2012-07-15 (×3): qty 1

## 2012-07-15 MED ORDER — FENTANYL CITRATE 0.05 MG/ML IJ SOLN
12.5000 ug | INTRAMUSCULAR | Status: DC | PRN
  Administered 2012-07-15: 25 ug via INTRAVENOUS
  Filled 2012-07-15: qty 2

## 2012-07-15 MED ORDER — PHENOL 1.4 % MT LIQD
1.0000 | OROMUCOSAL | Status: DC | PRN
  Administered 2012-07-15: 1 via OROMUCOSAL
  Filled 2012-07-15 (×2): qty 177

## 2012-07-15 MED ORDER — PROPOFOL 10 MG/ML IV BOLUS
INTRAVENOUS | Status: AC
  Filled 2012-07-15: qty 20

## 2012-07-15 MED ORDER — OSELTAMIVIR PHOSPHATE 6 MG/ML PO SUSR
150.0000 mg | Freq: Two times a day (BID) | ORAL | Status: DC
  Administered 2012-07-15 (×2): 150 mg via ORAL
  Filled 2012-07-15 (×4): qty 25

## 2012-07-15 MED ORDER — FUROSEMIDE 10 MG/ML IJ SOLN
20.0000 mg | Freq: Two times a day (BID) | INTRAMUSCULAR | Status: DC
  Administered 2012-07-15: 20 mg via INTRAVENOUS

## 2012-07-15 MED ORDER — POTASSIUM CHLORIDE 20 MEQ/15ML (10%) PO LIQD
40.0000 meq | Freq: Once | ORAL | Status: AC
  Administered 2012-07-15: 40 meq
  Filled 2012-07-15: qty 30

## 2012-07-15 MED ORDER — POTASSIUM CHLORIDE 20 MEQ/15ML (10%) PO LIQD
40.0000 meq | Freq: Once | ORAL | Status: DC
  Filled 2012-07-15: qty 30

## 2012-07-15 NOTE — Procedures (Signed)
Extubation Procedure Note  Patient Details:   Name: Christopher Bowers DOB: Jan 29, 1966 MRN: [id number]   Airway Documentation:   Pt extubated to 4L [place], no stridor noted. Pt able to vocalize and cough up mod thick tan secretions. Positive cuff leak  Evaluation  O2 sats: stable throughout and currently acceptable Complications: No apparent complications Patient did tolerate procedure well. Bilateral Breath Sounds: Diminished Suctioning: Oral   Christie Beckers 07/15/2012, 9:38 AM

## 2012-07-15 NOTE — Progress Notes (Signed)
Electrolyte protocol NA d/t replacement ordered by resident

## 2012-07-15 NOTE — Progress Notes (Signed)
PULMONARY  / CRITICAL CARE MEDICINE  Name: Christopher Bowers MRN: [id number] DOB: Dec 17, 1965    ADMISSION DATE:  07/08/2012 CONSULTATION DATE:  07/08/2012  REFERRING MD :  Lilly Cove  CHIEF COMPLAINT:  Short of breath  BRIEF PATIENT DESCRIPTION:  47 yo male present to [facility] on 2/8 with dyspnea, cough, and fever (Tm 102.6) with chills/aches  2nd to b/l PNA.  Failed to improved after outpt tx for levaquin for 2 days.  He was transferred to [facility] 2/9 for further tx.  SIGNIFICANT EVENTS / STUDIES:  2/9 Transfer to [facility] 2/9- intubation 2/12 - ambulated ICU 2/13- precedex started 2/14- failed precedex, back on propofol  LINES / TUBES: ETT 2/9 >> 2/15 R arterial line 2/9 >> 2/14 L ij cvc 2/9 >>  CULTURES: Blood 2/8 >> negative HIV 2/8 >> Non reactive Influenza 2/8 >> negative Pneumococcal Ag 2/8 >> negative Legionella Ag 2/8 >> negative Resp viral panel 2/9 >> Influenza A  ET Aspirate 2/8 >> negative BAL 2/10 >> negative Blood 2/14 >> Urine 2/14 >>  ANTIBIOTICS: Zithromax 2/8 >>2/10 Rocephin 2/8 >>2/10 Vancomycin 2/8 >>2/12 Tamiflu 2/8 >> plan stop 18th Ceftazidime 2/10 >>2/13 Levofloxacin 2/10 >>2/12 Ceftriaxone 2/13 >>plan stop 18th  SUBJECTIVE:  Large BM yesterday.    VITAL SIGNS: Temp:  [97.6 F (36.4 C)-102.2 F (39 C)] 97.6 F (36.4 C) (02/15 0431) Pulse Rate:  [66-88] 85 (02/15 0500) Resp:  [21-30] 24 (02/15 0500) BP: (96-166)/(55-133) 116/65 mmHg (02/15 0500) SpO2:  [94 %-100 %] 94 % (02/15 0500) FiO2 (%):  [40 %] 40 % (02/15 0339) Weight:  [244 lb 11.4 oz (111 kg)] 244 lb 11.4 oz (111 kg) (02/15 0500) HEMODYNAMICS: CVP:  [12 mmHg-24 mmHg] 12 mmHg VENTILATOR SETTINGS: Vent Mode:  [-] PRVC FiO2 (%):  [40 %] 40 % Set Rate:  [24 bmp] 24 bmp Vt Set:  [530 mL] 530 mL PEEP:  [5 cmH20] 5 cmH20 Plateau Pressure:  [19 cmH20-22 cmH20] 21 cmH20 INTAKE / OUTPUT: Intake/Output     02/14 0701 - 02/15 0700   I.V. (mL/kg) 1310.4 (11.8)   NG/GT 1950   Total  Intake(mL/kg) 3260.4 (29.4)   Urine (mL/kg/hr) 1690 (0.6)   Total Output 1690   Net +1570.4       Stool Occurrence 1 x     PHYSICAL EXAMINATION: General: alert Neuro:  Alert, follows commands HEENT:  WNL Cardiovascular:  s1s2 regular, no murmur Lungs:  Scattered rhonchi Abdomen:  +BS, distension improved, soft Musculoskeletal: no edema Skin:  No rashes  LABS:  Recent Labs Lab 07/08/12 1251  07/08/12 1325  07/11/12 0500  07/12/12 0436 07/12/12 0455 07/12/12 0500 07/12/12 1100 07/13/12 0515 07/13/12 1114 07/13/12 1130 07/13/12 1206 07/13/12 1714 07/13/12 2340 07/14/12 0830 07/15/12 0500  HGB  --   --  13.9  < > 11.9*  --   --   --  11.3*  --  11.0*  --   --   --   --   --   --   --   WBC  --   --  3.6*  < > 6.7  --   --   --  8.0  --  8.8  --   --   --   --   --   --   --   PLT  --   --  116*  < > 182  --   --   --  228  --  310  --   --   --   --   --   --   --  NA  --   --  133*  < > 140  --   --  144  --   --  148*  --  149*  --   --   --  149* 147*  K  --   --  3.8  < > 4.0  --   --  4.0  --   --  3.5  --  3.8  --   --   --  3.6 3.4*  CL  --   --  100  < > 100  --   --  102  --   --  104  --  105  --   --   --  107 105  CO2  --   --  25  < > 32  --   --  33*  --   --  37*  --  36*  --   --   --  33* 33*  GLUCOSE  --   --  157*  < > 131*  --   --  150*  --   --  139*  --  138*  --   --   --  145* 132*  BUN  --   --  11  < > 25*  --   --  34*  --   --  36*  --  34*  --   --   --  39* 40*  CREATININE  --   --  0.95  < > 1.07  --   --  1.01  --   --  0.98  --  0.94  --   --   --  1.04 0.91  CALCIUM  --   --  8.3*  < > 8.3*  --   --  8.5  --   --  8.7  --  8.7  --   --   --  8.8 8.5  PHOS  --   --   --   --   --   --   --  2.9  --   --   --   --   --   --   --   --   --   --   AST  --   --  53*  --   --   --   --   --   --   --   --   --   --   --   --   --   --   --   ALT  --   --  44  --   --   --   --   --   --   --   --   --   --   --   --   --   --   --   ALKPHOS   --   --  73  --   --   --   --   --   --   --   --   --   --   --   --   --   --   --   BILITOT  --   --  0.2*  --   --   --   --   --   --   --   --   --   --   --   --   --   --   --  PROT  --   --  5.9*  --   --   --   --   --   --   --   --   --   --   --   --   --   --   --   ALBUMIN  --   --  2.9*  --   --   --   --  2.5*  --   --   --   --   --   --   --   --   --   --   LATICACIDVEN 1.0  --   --   --   --   --   --   --   --   --   --   --   --   --   --   --   --   --   TROPONINI  --   --   --   --   --   --   --   --   --   --   --  <0.30  --   --  <0.30 <0.30  --   --   PHART  --   < >  --   < >  --   < > 7.387  --   --  7.417  --   --   --  7.449  --   --   --   --   PCO2ART  --   < >  --   < >  --   < > 52.9*  --   --  50.0*  --   --   --  58.1*  --   --   --   --   PO2ART  --   < >  --   < >  --   < > 74.6*  --   --  74.5*  --   --   --  79.0*  --   --   --   --   < > = values in this interval not displayed.  Recent Labs Lab 07/14/12 1239 07/14/12 1502 07/14/12 2131 07/14/12 2341 07/15/12 0430  GLUCAP 131* 133* 125* 116* 115*    Imaging: Dg Chest Port 1 View  07/14/2012  *RADIOLOGY REPORT*  Clinical Data: Evaluation of endotracheal.  History of pneumonia. Follow-up.  PORTABLE CHEST - 1 VIEW  Comparison: 07/12/2012.  Findings: Tip of endotracheal tube terminates 2 cm above the carina.  Tip of left internal jugular venous catheter terminates in the area of the superior vena cava at junction with brachiocephalic vein.  Enteric tube is in place with distal portion in the distal stomach but the tip is not included on the image.  No evidence of pneumothorax.  Low lung volumes.  Bilateral areas of subsegmental atelectasis and infiltrative densities.  Airspace disease in perihilar regions.  No definite pleural effusion demonstrated.  Enlargement of the cardiac silhouette which appears larger than on previous study and is accentuated by lack of deep respiratory result.  IMPRESSION: Tip  of endotracheal tube terminates 2 cm above the carina.  Tip of left internal jugular venous catheter terminates in the area of the superior vena cava at junction with brachiocephalic vein. No pneumothorax is evident.  Enteric tube is in place with distal portion in the distal stomach but the tip is not included on the image.  Low lung volumes.  Bilateral areas  of subsegmental atelectasis and infiltrative densities.  Airspace disease in perihilar regions.  Cardiac silhouette enlargement.  Cardiac silhouette appears larger than on prior study.  This may reflect lack of deep respiratory result.   Original Report Authenticated By: Onalee Hua Call    Dg Abd Portable 1v  07/14/2012  *RADIOLOGY REPORT*  Clinical Data: Abdominal distention  PORTABLE ABDOMEN - 1 VIEW  Comparison: Portable exam 1118 hours without priors for comparison  Findings: Nasogastric tube projects over the distal second portion of the duodenum. Mild gaseous distention of colon with gas and stool seen to distal sigmoid level. Rectum predominately excluded. Air filled upper normal caliber loops of small bowel in abdomen. Pelvic phleboliths. No definite evidence of bowel obstruction or bowel wall thickening. Bones unremarkable.  IMPRESSION: Nonobstructive bowel gas pattern.   Original Report Authenticated By: Ulyses Southward, M.D.      ASSESSMENT / PLAN:  PULMONARY A: Acute respiratory failure 2nd to Flu/CAP. Hx of OSA ARDS P:   extubated  CARDIOVASCULAR A: Hx of HTN HTN with wean - ecg and trop negative P:  Hold lisinopril further, slight crt rise D/c hydralazine Will use prn if needed unable to accurately assess PA pressures on echo. kvo  RENAL A:  Slight rise bun / crt, neg balance, mild hypernatremia P:   Monitor renal fx, urine outp D/c lasix D/c cvp  GASTROINTESTINAL A:  Nutrition. Hx of GERD. Constipation >> resolved P:   continue TF, oxepa (ARDS) Protonix zofran prn miralax BID prn  HEMATOLOGIC A:   Thrombocytopenia - mild due to sepsis, resolved P:  SQ heparin for DVT proph  INFECTIOUS A:  CAP >> Influenza A positive; cultures negative, fever 2/14 P:   Continue tamiflu high dose for 10 days, crt daily ceftriaxone to total 8 days F/u repeat cultures, no further fevers  ENDOCRINE A:  At risk hyperglycemia P:   ssi  NEUROLOGIC A:  Anxiety. Vent dyschrony P:   Propofol gtt - d/c Fentanyl prn MIND [place] - enrolled Ambulation as goal  BOOTH, ERIN Family Medicine, PGY-2 07/15/2012, 6:08 AM   I have interviewed and examined the patient and reviewed the database. I have formulated the assessment and plan as reflected in the note above with amendments made by me. 40 mins of direct critical care time provided  Billy Fischer, MD;  PCCM service; Mobile [phone]

## 2012-07-16 LAB — BASIC METABOLIC PANEL
BUN: 29 mg/dL — ABNORMAL HIGH (ref 6–23)
CO2: 31 mEq/L (ref 19–32)
Calcium: 9 mg/dL (ref 8.4–10.5)
Chloride: 105 mEq/L (ref 96–112)
Creatinine, Ser: 0.82 mg/dL (ref 0.50–1.35)
GFR calc Af Amer: >90 mL/min (ref >90)
GFR calc non Af Amer: >90 mL/min (ref >90)
Glucose, Bld: 125 mg/dL — ABNORMAL HIGH (ref 70–99)
Potassium: 4.1 mEq/L (ref 3.5–5.1)
Sodium: 142 mEq/L (ref 135–145)

## 2012-07-16 LAB — URINE CULTURE: Special Requests: >24

## 2012-07-16 LAB — CBC
Hemoglobin: 11.9 g/dL — ABNORMAL LOW (ref 13.0–17.0)
MCH: 29.9 pg (ref 26.0–34.0)
MCHC: 31.9 g/dL (ref 30.0–36.0)
RDW: 12.7 % (ref 11.5–15.5)

## 2012-07-16 LAB — GLUCOSE, CAPILLARY
Glucose-Capillary: 100 mg/dL — ABNORMAL HIGH (ref 70–99)
Glucose-Capillary: 114 mg/dL — ABNORMAL HIGH (ref 70–99)

## 2012-07-16 MED ORDER — HYDROCODONE-ACETAMINOPHEN 10-325 MG PO TABS
1.0000 | ORAL_TABLET | Freq: Four times a day (QID) | ORAL | Status: DC | PRN
  Administered 2012-07-16 – 2012-07-17 (×2): 1 via ORAL
  Filled 2012-07-16 (×2): qty 1

## 2012-07-16 MED ORDER — OSELTAMIVIR PHOSPHATE 75 MG PO CAPS
150.0000 mg | ORAL_CAPSULE | Freq: Two times a day (BID) | ORAL | Status: AC
  Administered 2012-07-16 – 2012-07-18 (×6): 150 mg via ORAL
  Filled 2012-07-16 (×6): qty 2

## 2012-07-16 MED ORDER — ACETAMINOPHEN 325 MG PO TABS
650.0000 mg | ORAL_TABLET | Freq: Four times a day (QID) | ORAL | Status: DC | PRN
  Administered 2012-07-16 – 2012-07-18 (×3): 650 mg via ORAL
  Filled 2012-07-16 (×3): qty 2

## 2012-07-16 MED ORDER — SODIUM CHLORIDE 0.9 % IV SOLN: INTRAVENOUS | Status: DC | PRN

## 2012-07-16 NOTE — Progress Notes (Signed)
Report called to Terri, RN on 6N, Patient ready to transport via wheelchair, O2, NT and spouse.

## 2012-07-16 NOTE — Progress Notes (Signed)
PULMONARY  / CRITICAL CARE MEDICINE  Name: Christopher Bowers MRN: [id number] DOB: January 04, 1966    ADMISSION DATE:  07/08/2012 CONSULTATION DATE:  07/08/2012  REFERRING MD :  Lilly Cove  CHIEF COMPLAINT:  Short of breath  BRIEF PATIENT DESCRIPTION:  47 yo male present to [facility] on 2/8 with dyspnea, cough, and fever (Tm 102.6) with chills/aches  2nd to b/l PNA.  Failed to improved after outpt tx for levaquin for 2 days.  He was transferred to [facility] 2/9 for further tx.  SIGNIFICANT EVENTS / STUDIES:  2/9 Transfer to [facility] 2/9- intubation 2/12 - ambulated ICU 2/13- precedex started 2/14- failed precedex, back on propofol  LINES / TUBES: ETT 2/9 >> 2/15 R arterial line 2/9 >> 2/14 L ij cvc 2/9 >>  CULTURES: Blood 2/8 >> negative HIV 2/8 >> Non reactive Influenza 2/8 >> negative Pneumococcal Ag 2/8 >> negative Legionella Ag 2/8 >> negative Resp viral panel 2/9 >> Influenza A  ET Aspirate 2/8 >> negative BAL 2/10 >> negative Blood 2/14 >> Urine 2/14 >>  ANTIBIOTICS: Zithromax 2/8 >>2/10 Rocephin 2/8 >>2/10 Vancomycin 2/8 >>2/12 Tamiflu 2/8 >> plan stop 18th Ceftazidime 2/10 >>2/13 Levofloxacin 2/10 >>2/12 Ceftriaxone 2/13 >>plan stop 18th  SUBJECTIVE:  Large BM yesterday.    VITAL SIGNS: Temp:  [97.1 F (36.2 C)-98.8 F (37.1 C)] 98.7 F (37.1 C) (02/16 1645) Pulse Rate:  [56-87] 84 (02/16 1645) Resp:  [12-27] 20 (02/16 1645) BP: (119-174)/(84-102) 142/99 mmHg (02/16 1645) SpO2:  [95 %-100 %] 95 % (02/16 1645) HEMODYNAMICS:   VENTILATOR SETTINGS:   INTAKE / OUTPUT: Intake/Output     02/15 0701 - 02/16 0700 02/16 0701 - 02/17 0700   P.O.  240   I.V. (mL/kg) 475 (4.3) 80 (0.7)   NG/GT 230    IV Piggyback 50    Total Intake(mL/kg) 755 (6.8) 320 (2.9)   Urine (mL/kg/hr) 1975 (0.7) 330 (0.3)   Total Output 1975 330   Net -1220 -10        Stool Occurrence 3 x 1 x     PHYSICAL EXAMINATION: General: alert Neuro:  Alert, follows commands HEENT:  WNL Cardiovascular:   s1s2 regular, no murmur Lungs:  Scattered rhonchi Abdomen:  +BS, distension improved, soft Musculoskeletal: no edema Skin:  No rashes  LABS:  Recent Labs Lab 07/11/12 0500  07/12/12 0436 07/12/12 0455 07/12/12 0500 07/12/12 1100 07/13/12 0515 07/13/12 1114  07/13/12 1206 07/13/12 1714 07/13/12 2340 07/14/12 0830 07/15/12 0500 07/16/12 0440  HGB 11.9*  --   --   --  11.3*  --  11.0*  --   --   --   --   --   --   --  11.9*  WBC 6.7  --   --   --  8.0  --  8.8  --   --   --   --   --   --   --  11.6*  PLT 182  --   --   --  228  --  310  --   --   --   --   --   --   --  420*  NA 140  --   --  144  --   --  148*  --   < >  --   --   --  149* 147* 142  K 4.0  --   --  4.0  --   --  3.5  --   < >  --   --   --  3.6 3.4* 4.1  CL 100  --   --  102  --   --  104  --   < >  --   --   --  107 105 105  CO2 32  --   --  33*  --   --  37*  --   < >  --   --   --  33* 33* 31  GLUCOSE 131*  --   --  150*  --   --  139*  --   < >  --   --   --  145* 132* 125*  BUN 25*  --   --  34*  --   --  36*  --   < >  --   --   --  39* 40* 29*  CREATININE 1.07  --   --  1.01  --   --  0.98  --   < >  --   --   --  1.04 0.91 0.82  CALCIUM 8.3*  --   --  8.5  --   --  8.7  --   < >  --   --   --  8.8 8.5 9.0  PHOS  --   --   --  2.9  --   --   --   --   --   --   --   --   --   --   --   ALBUMIN  --   --   --  2.5*  --   --   --   --   --   --   --   --   --   --   --   TROPONINI  --   --   --   --   --   --   --  <0.30  --   --  <0.30 <0.30  --   --   --   PHART  --   < > 7.387  --   --  7.417  --   --   --  7.449  --   --   --   --   --   PCO2ART  --   < > 52.9*  --   --  50.0*  --   --   --  58.1*  --   --   --   --   --   PO2ART  --   < > 74.6*  --   --  74.5*  --   --   --  79.0*  --   --   --   --   --   < > = values in this interval not displayed.  Recent Labs Lab 07/15/12 1929 07/15/12 2336 07/16/12 0341 07/16/12 0709 07/16/12 1113  GLUCAP 129* 100* 114* 101* 116*    Imaging: Dg Chest  Port 1 View  07/15/2012  *RADIOLOGY REPORT*  Clinical Data: Fever  PORTABLE CHEST - 1 VIEW  Comparison: Yesterday  Findings: Endotracheal tube and NG tube stable.  Left internal jugular central venous catheters stable.  Low volumes stable. Bilateral airspace opacities stable.  No pneumothorax. Normal heart size.  IMPRESSION: Stable bilateral airspace disease.   Original Report Authenticated By: Jolaine Click, M.D.      ASSESSMENT / PLAN:  PULMONARY A: Acute respiratory failure 2nd to Flu/CAP. Hx of OSA ARDS P:   Titrate O2 for sats >92%  Mobilize pt  PT  Transfer to reg bed  IS    CARDIOVASCULAR A: Hx of HTN HTN with wean - ecg and trop negative P:  Monitor , add rx if b/p rises  RENAL A:  Slight rise bun / crt, neg balance, mild hypernatremia Improved   P:   Cont to monitor    GASTROINTESTINAL A:  Nutrition. Hx of GERD. Constipation >> resolved P:    advance diet as tolerated  HEMATOLOGIC A:  Thrombocytopenia - mild due to sepsis, resolved P:  SQ heparin for DVT proph  INFECTIOUS A:  CAP >> Influenza A positive; cultures negative, fever 2/14 P:   Continue tamiflu high dose for 10 days,  D/c rocephin  Tr fever /wbc   ENDOCRINE A:  At risk hyperglycemia BS ok  P:   D/c SSI not a DM , not on steroids   NEUROLOGIC A:  Anxiety.  Improved    P:   Monitor     PARRETT,TAMMY NP-C     07/16/2012, 5:15 PM   I have interviewed and examined the patient and reviewed the database. I have formulated the assessment and plan as reflected in the note above with amendments made by me.  Begin D/C planning  Billy Fischer, MD;  PCCM service; Mobile [phone]

## 2012-07-17 MED ORDER — TRAZODONE HCL 100 MG PO TABS
100.0000 mg | ORAL_TABLET | Freq: Every day | ORAL | Status: DC
  Filled 2012-07-17: qty 1

## 2012-07-17 MED ORDER — DIPHENHYDRAMINE HCL 25 MG PO CAPS
25.0000 mg | ORAL_CAPSULE | Freq: Every evening | ORAL | Status: DC | PRN
  Administered 2012-07-17 – 2012-07-18 (×2): 25 mg via ORAL
  Filled 2012-07-17 (×2): qty 1

## 2012-07-17 NOTE — Progress Notes (Signed)
Spoke with patient's wife with patient's permission regarding Link to Wellness program with [facility] Care Management services. Consents signed. Also patient likely need home health services as well. Made inpatient case manager aware. Contact information and Link to Wellness packet given to wife. Patient will receive post hospital transition of care call. Confirmed phone number to call. Will also be evaluated for monthly home visits initially after discharge.  Raiford Noble, MSN- Ed, RN,BSN [facility] Liaison [phone]

## 2012-07-17 NOTE — Progress Notes (Signed)
Physical Therapy Treatment Patient Details Name: Christopher Bowers MRN: [id number] DOB: 02-27-1966 Today's Date: 07/17/2012 Time: 4098-1191 PT Time Calculation (min): 19 min  PT Assessment / Plan / Recommendation Comments on Treatment Session  Pt demonstrates improvements in mobility since this am. Patient able to perform ambulation with minimal instability. Will plan to increase ambulation distance tomorrow am and encourage ambulation with nsg if safe.    Follow Up Recommendations  Supervision - Intermittent;Supervision for mobility/OOB           Equipment Recommendations  Rolling walker with 5" wheels       Frequency Min 3X/week   Plan Discharge plan needs to be updated    Precautions / Restrictions Precautions Precautions: Fall Restrictions Weight Bearing Restrictions: No   Pertinent Vitals/Pain No pain reported    Mobility  Bed Mobility Bed Mobility: Rolling Right;Right Sidelying to Sit;Sitting - Scoot to Edge of Bed Rolling Right: 5: Supervision Right Sidelying to Sit: 5: Supervision Sitting - Scoot to Edge of Bed: 5: Supervision Transfers Transfers: Sit to Stand;Stand to Sit Sit to Stand: 4: Min guard;4: Min assist (mulltiple trials performed 1st with assist, 2+3rd with guard) Stand to Sit: 4: Min guard;To bed Ambulation/Gait Ambulation/Gait Assistance: 4: Min guard Ambulation Distance (Feet): 40 Feet Assistive device: Rolling walker Ambulation/Gait Assistance Details: pt initially unsteady, pt able to improve to min guard/close supervision, ambulation limited by in room o2; plan for longer ambulation tomorrow Gait Pattern: Step-through pattern;Decreased stride length (posterior lean initially) Gait velocity: decreased General Gait Details: VCs for body positioning Stairs: No      PT Goals Acute Rehab PT Goals PT Goal: Supine/Side to Sit - Progress: Progressing toward goal PT Goal: Sit at Edge Of Bed - Progress: Progressing toward goal PT Goal: Sit to Stand -  Progress: Progressing toward goal PT Transfer Goal: Bed to Chair/Chair to Bed - Progress: Progressing toward goal PT Goal: Ambulate - Progress: Progressing toward goal  Visit Information  Last PT Received On: 07/17/12 Assistance Needed: +1    Subjective Data  Subjective: Pt pleasent and feeling much better Patient Stated Goal: to go home   Cognition  Cognition Overall Cognitive Status: Appears within functional limits for tasks assessed/performed Arousal/Alertness: Awake/alert Orientation Level: Appears intact for tasks assessed Behavior During Session: [facility] for tasks performed       End of Session PT - End of Session Equipment Utilized During Treatment: Gait belt;Oxygen Activity Tolerance: Patient limited by fatigue Patient left: in bed;with call bell/phone within reach;with family/visitor present Nurse Communication: Mobility status;Need for lift equipment;Other (comment) (left RW as nsg can stand pivot back to bed if desired)   GP     [name] 07/17/2012, 3:34 PM [name], PT DPT  [phone]

## 2012-07-17 NOTE — Progress Notes (Signed)
PULMONARY  / CRITICAL CARE MEDICINE  Name: Christopher Bowers MRN: [id number] DOB: Jan 19, 1966    ADMISSION DATE:  07/08/2012 CONSULTATION DATE:  07/08/2012  REFERRING MD :  Lilly Cove  CHIEF COMPLAINT:  Short of breath  BRIEF PATIENT DESCRIPTION:  47 yo male present to [facility] on 2/8 with dyspnea, cough, and fever (Tm 102.6) with chills/aches  2nd to b/l PNA.  Failed to improved after outpt tx for levaquin for 2 days.  He was transferred to [facility] 2/9 for further tx.  SIGNIFICANT EVENTS / STUDIES:  2/9 Transfer to [facility] 2/9- intubation 2/12 - ambulated ICU 2/13- precedex started 2/14- failed precedex, back on propofol  LINES / TUBES: ETT 2/9 >> 2/15 R arterial line 2/9 >> 2/14 L ij cvc 2/9 >>  CULTURES: Blood 2/8 >> negative HIV 2/8 >> Non reactive Influenza 2/8 >> negative Pneumococcal Ag 2/8 >> negative Legionella Ag 2/8 >> negative Resp viral panel 2/9 >> Influenza A  ET Aspirate 2/8 >> negative BAL 2/10 >> negative Blood 2/14 >> Urine 2/14 >>  ANTIBIOTICS: Zithromax 2/8 >>2/10 Rocephin 2/8 >>2/10 Vancomycin 2/8 >>2/12 Tamiflu 2/8 >> plan stop 18th Ceftazidime 2/10 >>2/13 Levofloxacin 2/10 >>2/12 Ceftriaxone 2/13 >>plan stop 18th  SUBJECTIVE:  Large BM yesterday.    VITAL SIGNS: Temp:  [97.9 F (36.6 C)-99.1 F (37.3 C)] 97.9 F (36.6 C) (02/17 0654) Pulse Rate:  [56-87] 86 (02/17 0934) Resp:  [17-24] 18 (02/17 0934) BP: (133-155)/(82-99) 133/99 mmHg (02/17 0934) SpO2:  [94 %-100 %] 96 % (02/17 0934) HEMODYNAMICS:   VENTILATOR SETTINGS:   INTAKE / OUTPUT: Intake/Output     02/16 0701 - 02/17 0700 02/17 0701 - 02/18 0700   P.O. 360    I.V. (mL/kg) 80 (0.7)    NG/GT     IV Piggyback     Total Intake(mL/kg) 440 (4)    Urine (mL/kg/hr) 330 (0.1)    Total Output 330     Net +110          Urine Occurrence 200 x    Stool Occurrence 1 x      PHYSICAL EXAMINATION: General: alert Neuro:  Alert, follows commands HEENT:  WNL Cardiovascular:  s1s2 regular, no  murmur Lungs:  Scattered rhonchi Abdomen:  +BS, distension improved, soft Musculoskeletal: no edema Skin:  No rashes  LABS:  Recent Labs Lab 07/11/12 0500  07/12/12 0436 07/12/12 0455 07/12/12 0500 07/12/12 1100 07/13/12 0515 07/13/12 1114  07/13/12 1206 07/13/12 1714 07/13/12 2340 07/14/12 0830 07/15/12 0500 07/16/12 0440  HGB 11.9*  --   --   --  11.3*  --  11.0*  --   --   --   --   --   --   --  11.9*  WBC 6.7  --   --   --  8.0  --  8.8  --   --   --   --   --   --   --  11.6*  PLT 182  --   --   --  228  --  310  --   --   --   --   --   --   --  420*  NA 140  --   --  144  --   --  148*  --   < >  --   --   --  149* 147* 142  K 4.0  --   --  4.0  --   --  3.5  --   < >  --   --   --  3.6 3.4* 4.1  CL 100  --   --  102  --   --  104  --   < >  --   --   --  107 105 105  CO2 32  --   --  33*  --   --  37*  --   < >  --   --   --  33* 33* 31  GLUCOSE 131*  --   --  150*  --   --  139*  --   < >  --   --   --  145* 132* 125*  BUN 25*  --   --  34*  --   --  36*  --   < >  --   --   --  39* 40* 29*  CREATININE 1.07  --   --  1.01  --   --  0.98  --   < >  --   --   --  1.04 0.91 0.82  CALCIUM 8.3*  --   --  8.5  --   --  8.7  --   < >  --   --   --  8.8 8.5 9.0  PHOS  --   --   --  2.9  --   --   --   --   --   --   --   --   --   --   --   ALBUMIN  --   --   --  2.5*  --   --   --   --   --   --   --   --   --   --   --   TROPONINI  --   --   --   --   --   --   --  <0.30  --   --  <0.30 <0.30  --   --   --   PHART  --   < > 7.387  --   --  7.417  --   --   --  7.449  --   --   --   --   --   PCO2ART  --   < > 52.9*  --   --  50.0*  --   --   --  58.1*  --   --   --   --   --   PO2ART  --   < > 74.6*  --   --  74.5*  --   --   --  79.0*  --   --   --   --   --   < > = values in this interval not displayed.  Recent Labs Lab 07/15/12 1929 07/15/12 2336 07/16/12 0341 07/16/12 0709 07/16/12 1113  GLUCAP 129* 100* 114* 101* 116*    Imaging: No results  found.   ASSESSMENT / PLAN:  PULMONARY A: Acute respiratory failure 2nd to Flu/CAP. Hx of OSA ARDS P:   Titrate O2 for sats >92%  Mobilize pt  PT  Transfer to reg bed  IS    CARDIOVASCULAR A: Hx of HTN HTN with wean - ecg and trop negative Syncope 2-17 P:  Monitor , add rx if b/p rises  2-17 check 12 lead. otrhostaic bp bid May need cards consult RENAL Lab Results  Component Value Date   CREATININE 0.82 07/16/2012   CREATININE 0.91 07/15/2012  CREATININE 1.04 07/14/2012    A:  Slight rise bun / crt, neg balance, mild hypernatremia Improved   P:   Cont to monitor    GASTROINTESTINAL A:  Nutrition. Hx of GERD. Constipation >> resolved P:    advance diet as tolerated  HEMATOLOGIC A:  Thrombocytopenia - mild due to sepsis, resolved P:  SQ heparin for DVT proph  INFECTIOUS A:  CAP >> Influenza A positive; cultures negative, fever 2/14 P:   Continue tamiflu high dose for 10 days,  D/c rocephin  Tr fever /wbc   ENDOCRINE A:  At risk hyperglycemia BS ok  P:   D/c SSI not a DM , not on steroids   NEUROLOGIC A:  Anxiety.  Improved    P:   Monitor     Global: Not ready for DC 2-17.  Brett Canales Minor ACNP [facility] Pager [phone] till 3 pm If no answer page [phone] 07/17/2012, 10:46 AM    I have interviewed and examined the patient and reviewed the database. I have formulated the assessment and plan as reflected in the note above with amendments made by me.   Insomnia > trazodone  Billy Fischer, MD;  PCCM service; Mobile [phone]

## 2012-07-17 NOTE — Progress Notes (Signed)
eLink Physician-Brief Progress Note Patient Name: Christopher Bowers DOB: [birth year] MRN: [id number]  Date of Service  07/17/2012   HPI/Events of Note   Pt requesting a change from trazadone to benadryl for sleep. Also notes that he is on auto-set CPAP at home (marginal compliance)   eICU Interventions  Set up auto-set CPAP Changed to benadryl 25mg  qhs prn   Intervention Category Minor Interventions: Routine modifications to care plan (e.g. PRN medications for pain, fever)  Deonna Krummel S. 07/17/2012, 6:32 PM

## 2012-07-17 NOTE — Evaluation (Signed)
Physical Therapy Evaluation Patient Details Name: Christopher Bowers MRN: [id number] DOB: Jan 23, 1966 Today's Date: 07/17/2012 Time: 2956-2130 PT Time Calculation (min): 26 min  PT Assessment / Plan / Recommendation Clinical Impression  Pt is a pleasent 47 y.o. male s/p H1N1 with VDRF, pt has since had the ventilator removed and patient is modestly becoming stronger.  Patient was in bathroom with spouse when he had a BM and possible vagal episode of syncope.  Nsg and myself responded to room to assist.  As patient aroused patient felt good and appeared very interactive and pleasent.  Assessed vitals with nsg; patient 98% on 2 liters with no further symptoms. Will attempt to work with patient again this afternoon if time permits. Will continue to see acutely to maximize functional mobility with hopes of discharge home with family support.    PT Assessment  Patient needs continued PT services    Follow Up Recommendations  Supervision - Intermittent;Supervision for mobility/OOB    Does the patient have the potential to tolerate intense rehabilitation      Barriers to Discharge        Equipment Recommendations  Rolling walker with 5" wheels    Recommendations for Other Services     Frequency Min 3X/week    Precautions / Restrictions Precautions Precautions: Fall Restrictions Weight Bearing Restrictions: No   Pertinent Vitals/Pain 98% on 2 liters following syncopal episode; Hr 86, No pain      Mobility  Bed Mobility Bed Mobility: Rolling Left;Sit to Supine Rolling Left: 5: Supervision;With rail Sit to Supine: 4: Min guard Transfers Transfers: Sit to Stand;Stand to Sit (From shower seat to the 'steady' lift) Sit to Stand: 4: Min assist;From chair/3-in-1 Stand to Sit: 4: Min guard;To bed Stand Pivot Transfers: Not tested (comment) Details for Transfer Assistance: Pt receieved in bathroom on shower seat s/p possible vagal syncope episode. Pt aroused and monitored with nsg present.  Patient able to stand with min assist to position the steady, pt transported to bed and pt again able to stand with minimal assist in order to get out of steady and on bed  Ambulation/Gait Ambulation/Gait Assistance: Not tested (comment)        PT Diagnosis: Generalized weakness  PT Problem List: Decreased activity tolerance;Decreased balance;Decreased mobility;Decreased knowledge of precautions;Decreased safety awareness;Decreased knowledge of use of DME PT Treatment Interventions: DME instruction;Gait training;Functional mobility training;Therapeutic activities;Balance training;Therapeutic exercise;Stair training;Patient/family education   PT Goals Acute Rehab PT Goals PT Goal Formulation: With patient Time For Goal Achievement: 07/25/12 Potential to Achieve Goals: Good Pt will go Supine/Side to Sit: Independently PT Goal: Supine/Side to Sit - Progress: Goal set today Pt will Sit at Edge of Bed: Independently;6-10 min;with no upper extremity support PT Goal: Sit at Edge Of Bed - Progress: Goal set today Pt will go Sit to Stand: with supervision PT Goal: Sit to Stand - Progress: Goal set today Pt will Transfer Bed to Chair/Chair to Bed: with modified independence PT Transfer Goal: Bed to Chair/Chair to Bed - Progress: Goal set today Pt will Ambulate: 51 - 150 feet;with supervision;with least restrictive assistive device PT Goal: Ambulate - Progress: Goal set today Pt will Go Up / Down Stairs: 3-5 stairs;with modified independence;with least restrictive assistive device PT Goal: Up/Down Stairs - Progress: Goal set today  Visit Information  Last PT Received On: 07/17/12 Assistance Needed: +1    Subjective Data  Subjective: Pt intubated but nods head to yes no questions Patient Stated Goal: To go home   Prior Functioning  Home Living Lives With: Spouse Type of Home: House Home Layout: One level Bathroom Toilet: Standard Home Adaptive Equipment: None Additional Comments:  Unsure about steps at home.   Prior Function Level of Independence: Independent Able to Take Stairs?: Yes Driving: Yes Vocation: Full time employment    Cognition  Cognition Overall Cognitive Status: Appears within functional limits for tasks assessed/performed Arousal/Alertness: Awake/alert Orientation Level: Appears intact for tasks assessed Behavior During Session: [facility] for tasks performed    Extremity/Trunk Assessment Right Upper Extremity Assessment RUE ROM/Strength/Tone: Within functional levels Left Upper Extremity Assessment LUE ROM/Strength/Tone: Within functional levels Right Lower Extremity Assessment RLE ROM/Strength/Tone: [facility] for tasks assessed Left Lower Extremity Assessment LLE ROM/Strength/Tone: WFL for tasks assessed Trunk Assessment Trunk Assessment: Normal   Balance Static Sitting Balance Static Sitting - Balance Support: Bilateral upper extremity supported;Feet supported Static Sitting - Level of Assistance: 5: Stand by assistance Static Sitting - Comment/# of Minutes: 6 minutes Static Standing Balance Static Standing - Balance Support: Bilateral upper extremity supported;During functional activity Static Standing - Level of Assistance: 3: Mod assist Static Standing - Comment/# of Minutes: 30 sec x 2  End of Session PT - End of Session Equipment Utilized During Treatment: Gait belt;Oxygen Activity Tolerance: Patient limited by fatigue Patient left: in bed;with call bell/phone within reach;with family/visitor present Nurse Communication: Mobility status;Need for lift equipment;Other (comment) (left RW as nsg can stand pivot back to bed if desired)  GP     [name] 07/17/2012, 10:25 AM  [name], PT DPT  [phone]

## 2012-07-17 NOTE — Progress Notes (Signed)
Rn was called into patient room because patient "passed out" in the shower with wife. Pt had just gotten done having a bowel movement and per wife he started "looking clammy and grey". She had him sit down and at that point apparently had a syncopal episode. By the time that staff arrived patient had already come to. Christopher Bowers the nurse tech took vitals and used the steady to get patient back to bed. Christopher Bowers with PCCM entered the room shortly after episode and thinks that he may have had a vagal response. Pt was laid down in bed and Rn monitored patient asking him if he knew where he was and what happened. Rn will continue to monitor patient for any other episodes.

## 2012-07-18 ENCOUNTER — Inpatient Hospital Stay (HOSPITAL_COMMUNITY): Payer: 59

## 2012-07-18 LAB — BASIC METABOLIC PANEL
BUN: 26 mg/dL — ABNORMAL HIGH (ref 6–23)
Chloride: 109 mEq/L (ref 96–112)
Creatinine, Ser: 0.98 mg/dL (ref 0.50–1.35)
Glucose, Bld: 103 mg/dL — ABNORMAL HIGH (ref 70–99)
Potassium: 3.6 mEq/L (ref 3.5–5.1)

## 2012-07-18 LAB — CBC
HCT: 37.1 % — ABNORMAL LOW (ref 39.0–52.0)
Hemoglobin: 11.9 g/dL — ABNORMAL LOW (ref 13.0–17.0)
MCHC: 32.1 g/dL (ref 30.0–36.0)
MCV: 93.9 fL (ref 78.0–100.0)
WBC: 10.3 10*3/uL (ref 4.0–10.5)

## 2012-07-18 MED ORDER — NON FORMULARY: 1.0000 mg | Freq: Every day | Status: DC

## 2012-07-18 NOTE — Progress Notes (Signed)
Physical Therapy Treatment Patient Details Name: Christopher Bowers MRN: [id number] DOB: 1965/10/27 Today's Date: 07/18/2012 Time: 1136-1200 PT Time Calculation (min): 24 min  PT Assessment / Plan / Recommendation Comments on Treatment Session  Pt is making significant progress towards PT goals at this time. Patient able to increase ambulation around entire unit without difficulty.  Pt recieved on 3 liters O2, Trialed patient on rm air and patient maintained levels at rest of 98%, during ambulation on rm air patient able to maintain levels between 94-98% without difficulty. Anticipate patient will be safe for d/c home with family assist when medically cleared.    Follow Up Recommendations  Supervision - Intermittent;Supervision for mobility/OOB           Equipment Recommendations  Rolling walker with 5" wheels       Frequency Min 3X/week   Plan Discharge plan needs to be updated    Precautions / Restrictions Precautions Precautions: Fall Restrictions Weight Bearing Restrictions: No       Mobility  Bed Mobility Bed Mobility: Rolling Right;Right Sidelying to Sit;Sitting - Scoot to Edge of Bed Rolling Right: 5: Supervision Right Sidelying to Sit: 5: Supervision Sitting - Scoot to Edge of Bed: 5: Supervision Transfers Transfers: Sit to Stand;Stand to Sit Sit to Stand: 5: Supervision Stand to Sit: 5: Supervision Details for Transfer Assistance: VC's for hand placement Ambulation/Gait Ambulation/Gait Assistance: 5: Supervision Ambulation Distance (Feet): 400 Feet Assistive device: Rolling walker Ambulation/Gait Assistance Details: Monitored patients SpO2 during ambulation; Patient steady with ambulation. VC's for breath control and pursed lip breathing Gait Pattern: Step-through pattern;Decreased stride length Gait velocity: WFL General Gait Details: VCs for body positioning Stairs: No      PT Goals Acute Rehab PT Goals PT Goal: Supine/Side to Sit - Progress: Progressing  toward goal PT Goal: Sit at Edge Of Bed - Progress: Progressing toward goal PT Goal: Sit to Stand - Progress: Progressing toward goal PT Transfer Goal: Bed to Chair/Chair to Bed - Progress: Progressing toward goal PT Goal: Ambulate - Progress: Progressing toward goal  Visit Information  Last PT Received On: 07/18/12 Assistance Needed: +1    Subjective Data  Subjective: I am ready to get moving Patient Stated Goal: to go home   Cognition  Cognition Overall Cognitive Status: Appears within functional limits for tasks assessed/performed Arousal/Alertness: Awake/alert Orientation Level: Appears intact for tasks assessed Behavior During Session: [facility] for tasks performed    Balance  Static Sitting Balance Static Sitting - Balance Support: Bilateral upper extremity supported;Feet supported Static Sitting - Level of Assistance: 5: Stand by assistance Static Sitting - Comment/# of Minutes: 4 minutes to rest and control breathing Static Standing Balance Static Standing - Balance Support: Bilateral upper extremity supported Static Standing - Level of Assistance: 6: Modified independent (Device/Increase time) Static Standing - Comment/# of Minutes: 2 minutes rest during ambulation  End of Session PT - End of Session Equipment Utilized During Treatment: Gait belt;Oxygen Activity Tolerance: Patient tolerated treatment well Patient left: in chair;with call bell/phone within reach;with family/visitor present Nurse Communication: Mobility status;Need for lift equipment;Other (comment) (pt ambulated on rm air without difficulty saturation 94-98%)   GP     Christopher Bowers 07/18/2012, 1:29 PM [name], PT DPT  [phone]

## 2012-07-18 NOTE — Progress Notes (Signed)
Pharmacy note: Re - Melatonin 1 mg  Melatonin has been ordered for insomnia. Pharmacy does not have this available. Patient noted for likely discharge in am.  Plan -Will hold melatonin for now -Consider starting this medication at discharge -If this needs to be ordered due to an extended stay, please notify pharmacy  Thank you, Harland German, Pharm D 07/18/2012 12:33 PM

## 2012-07-18 NOTE — Progress Notes (Signed)
PULMONARY  / CRITICAL CARE MEDICINE  Name: Christopher Bowers MRN: [id number] DOB: Dec 01, 1965    ADMISSION DATE:  07/08/2012 CONSULTATION DATE:  07/08/2012  REFERRING MD :  Lilly C[place]  CHIEF COMPLAINT:  Short of breath  BRIEF PATIENT DESCRIPTION:  47 yo male present to [facility] on 2/8 with dyspnea, cough, and fever (Tm 102.6) with chills/aches  2nd to b/l PNA.  Failed to improved after outpt tx for levaquin for 2 days.  He was transferred to [facility] 2022-07-23 for further tx.  SIGNIFICANT EVENTS / STUDIES:  July 23, 2022 - Transfer to [facility] 07/23/2022 - intubation 2/12 - ambulated ICU 2/13 - precedex started 2/14 - failed precedex, back on propofol 2/17 - insomnia, CPAP (autoset, wears at home)  LINES / TUBES: ETT 23-Jul-2022 >> 2/15 R arterial line 07-23-22 >> 2/14 L ij cvc 07/23/2022 >>  CULTURES: Blood 2/8 >> negative HIV 2/8 >> Non reactive Influenza 2/8 >> negative Pneumococcal Ag 2/8 >> negative Legionella Ag 2/8 >> negative Resp viral panel July 23, 2022 >> Influenza A  ET Aspirate 2/8 >> negative BAL 2/10 >> negative Blood 2/14 >> Urine 2/14 >>neg  ANTIBIOTICS: Zithromax 2/8 >>2/10 Rocephin 2/8 >>2/10 Vancomycin 2/8 >>2/12 Tamiflu 2/8 >> plan stop 18th Ceftazidime 2/10 >>2/13 Levofloxacin 2/10 >>2/12 Ceftriaxone 2/13 >>plan stop 18th  SUBJECTIVE: Patient reports he is walking well with walker.  "feels like I ran a marathon"   VITAL SIGNS: Temp:  [98.1 F (36.7 C)-99 F (37.2 C)] 98.1 F (36.7 C) (02/18 0645) Pulse Rate:  [85-86] 86 (02/18 0645) Resp:  [18] 18 (02/18 0645) BP: (133-143)/(87-92) 136/91 mmHg (02/18 0645) SpO2:  [93 %-96 %] 96 % (02/18 0645)  INTAKE / OUTPUT: Intake/Output     02/17 0701 - 02/18 0700 02/18 0701 - 02/19 0700   P.O.     I.V. (mL/kg)     Total Intake(mL/kg)     Urine (mL/kg/hr) 3 (0)    Total Output 3     Net -3          Urine Occurrence 2 x      PHYSICAL EXAMINATION: General: NAD Neuro:  Alert, oriented, follows commands HEENT:  Mild icterus, pingueculum on R  eye Cardiovascular:  s1s2 regular, no murmur Lungs:  resp's even/non-labored, lungs bilaterally clear Abdomen:  +BS, distension improved, soft Musculoskeletal: no edema Skin:  No rashes  LABS:  Recent Labs Lab 07/12/12 0436  07/12/12 0455  07/12/12 1100 07/13/12 0515 07/13/12 1114  07/13/12 1206 07/13/12 1714 07/13/12 2340  07/15/12 0500 07/16/12 0440 07/18/12 0450  HGB  --   --   --   < >  --  11.0*  --   --   --   --   --   --   --  11.9* 11.9*  WBC  --   --   --   < >  --  8.8  --   --   --   --   --   --   --  11.6* 10.3  PLT  --   --   --   < >  --  310  --   --   --   --   --   --   --  420* 450*  NA  --   < > 144  --   --  148*  --   < >  --   --   --   < > 147* 142 146*  K  --   < >  4.0  --   --  3.5  --   < >  --   --   --   < > 3.4* 4.1 3.6  CL  --   < > 102  --   --  104  --   < >  --   --   --   < > 105 105 109  CO2  --   < > 33*  --   --  37*  --   < >  --   --   --   < > 33* 31 28  GLUCOSE  --   < > 150*  --   --  139*  --   < >  --   --   --   < > 132* 125* 103*  BUN  --   < > 34*  --   --  36*  --   < >  --   --   --   < > 40* 29* 26*  CREATININE  --   < > 1.01  --   --  0.98  --   < >  --   --   --   < > 0.91 0.82 0.98  CALCIUM  --   < > 8.5  --   --  8.7  --   < >  --   --   --   < > 8.5 9.0 8.8  PHOS  --   --  2.9  --   --   --   --   --   --   --   --   --   --   --   --   ALBUMIN  --   --  2.5*  --   --   --   --   --   --   --   --   --   --   --   --   TROPONINI  --   --   --   --   --   --  <0.30  --   --  <0.30 <0.30  --   --   --   --   PHART 7.387  --   --   --  7.417  --   --   --  7.449  --   --   --   --   --   --   PCO2ART 52.9*  --   --   --  50.0*  --   --   --  58.1*  --   --   --   --   --   --   PO2ART 74.6*  --   --   --  74.5*  --   --   --  79.0*  --   --   --   --   --   --   < > = values in this interval not displayed.  Recent Labs Lab 07/15/12 1929 07/15/12 2336 07/16/12 0341 07/16/12 0709 07/16/12 1113  GLUCAP 129* 100* 114*  101* 116*    Imaging: Dg Chest 2 View  07/18/2012  *RADIOLOGY REPORT*  Clinical Data: Resolving shortness of breath.  Follow up of "infiltrates."  CHEST - 2 VIEW  Comparison: 07/15/2012  Findings: Removal of endotracheal and nasogastric tubes. Normal heart size.  No pleural effusion or pneumothorax.  Mildly low lung volumes.  Improved diffuse interstitial and airspace opacities. Slightly  greater on the right than left.  IMPRESSION: Improved aeration with persistent interstitial and airspace opacities.  Favor infection, including atypical etiologies. Pulmonary edema felt less likely, given absence of pleural fluid.   Original Report Authenticated By: Jeronimo Greaves, M.D.      ASSESSMENT / PLAN:  PULMONARY A:  Acute respiratory failure 2nd to Flu/CAP. Hx of OSA ARDS  P:   Titrate O2 for sats >92%  Mobilize pt  PT  IS  cpap qhs  CARDIOVASCULAR A:  Hx of HTN HTN with wean - ecg and trop negative Syncope 2-17  P:  Monitor, add rx if b/p rises   RENAL  A:   Slight rise bun / crt, neg balance, mild hypernatremia Improved   P:   Cont to monitor    GASTROINTESTINAL A:   Nutrition. Hx of GERD. Constipation >> resolved ? Icterus   P:   -advance diet as tolerated -check LFT's, Cholesterol in am with eye findings  HEMATOLOGIC A:   Thrombocytopenia - mild due to sepsis, resolved  P:  SQ heparin for DVT proph  INFECTIOUS A:   CAP >> Influenza A positive; cultures negative, fever 2/14  P:   Continue tamiflu high dose for 10 days Tr fever /wbc   ENDOCRINE A:   No acute issues.   P:   Follow glucose on BMP   NEUROLOGIC A:   Anxiety - Improved  Insomnia  P:   Monitor  Benadryl Melatonin   Plan for d/c in am.  Rolling walker ordered, home PT, and follow up with Pulmonary in 6 months for PFT's.     Canary Brim, NP-C [facility] Pgr: [phone] or [phone]   I have interviewed and examined the patient and reviewed the database.  I have formulated the assessment and plan as reflected in the note above with amendments made by me.   Billy Fischer, MD;  PCCM service; Mobile [phone]

## 2012-07-18 NOTE — Progress Notes (Signed)
NUTRITION FOLLOW UP  Intervention:   1.  General healthful diet; encourage intake.  Pt is slowly improving.  No supplements ordered at this time.  Will continue to monitor intake.    Nutrition Dx:   Inadequate oral intake, ongoing.  Monitor:   1.  Food/Beverage; improvement in PO intake to pt meeting >/=90% of estimated needs.  Assessment:   Pt admitted with H1N1 complicated by PNA.  Pt required vent support and was maintained on TFs.  Pt was extubated (2/15). Diet has been advanced to Regular.  Pt states his appetite and intake are slowly improving.  Pt states for first 1-2 days post extubation all foods tasted the same.  He states his taste is slowly returning.  He is eating some outside food brought by his wife at time of visit.  RD to follow.  Height: Ht Readings from Last 1 Encounters:  07/09/12 5\' 11"  (1.803 m)    Weight Status:   Wt Readings from Last 1 Encounters:  07/15/12 244 lb 11.4 oz (111 kg)    Re-estimated needs:  Kcal: 2400-2580 Protein: 86-102g Fluid: >2.4 L/day  Skin: intact  Diet Order: General   Intake/Output Summary (Last 24 hours) at 07/18/12 1534 Last data filed at 07/18/12 0646  Gross per 24 hour  Intake      0 ml  Output      3 ml  Net     -3 ml    Last BM: 2/18  Labs:   Recent Labs Lab 07/12/12 0455  07/15/12 0500 07/16/12 0440 07/18/12 0450  NA 144  < > 147* 142 146*  K 4.0  < > 3.4* 4.1 3.6  CL 102  < > 105 105 109  CO2 33*  < > 33* 31 28  BUN 34*  < > 40* 29* 26*  CREATININE 1.01  < > 0.91 0.82 0.98  CALCIUM 8.5  < > 8.5 9.0 8.8  PHOS 2.9  --   --   --   --   GLUCOSE 150*  < > 132* 125* 103*  < > = values in this interval not displayed.  CBG (last 3)   Recent Labs  07/16/12 0341 07/16/12 0709 07/16/12 1113  GLUCAP 114* 101* 116*    Scheduled Meds: . fluticasone  1 spray Each Nare Daily  . heparin  5,000 Units Subcutaneous Q8H  . oseltamivir  150 mg Oral BID    Continuous Infusions:   Loyce Dys, MS RD  LDN Clinical Inpatient Dietitian Pager: [phone] Weekend/After hours pager: [phone]

## 2012-07-18 NOTE — Discharge Summary (Signed)
Physician Discharge Summary  Patient ID: Christopher Bowers MRN: [id number] DOB/AGE: [birth year] 47 y.o.  Admit date: 07/08/2012 Discharge date: 07/19/2012    Discharge Diagnoses:  Acute Respiratory Failure H1N1 influenza CAP ARDS (adult respiratory distress syndrome) OSA HTN Elevated Lipids Syncopal Episode Acute Renal Insufficiency Thrombocytopenia Constipation Anxiety  Insomnia Deconditioning of critical illness   Brief Summary: Christopher Bowers is a 47 y.o. y/o male with a PMH of acid reflux, HTN, OSA on CPAP (non-compliant), allergies, situational depression who presented to [facility] on 2/8 with dyspnea, cough, and fever (Tm 102.6), chills and body aches that felt like flu. He sought care from his PCP and was treated with levaquin.  He failed to improve after outpt Rx with levaquin for 2 days and sought care in ER after progression of symptoms.  Work up demonstrated chest xray consistent with bilateral PNA, hypoxia despite 6L on BiPAP.  He was transferred to [facility] ICU 2/9 for further treatment.  He was empirically treated with anti-biotics, anti-virals and supported with BiPAP.  Pan-cultured and are negative to date.  Viral culture was positive for H1N1 (influenza A).  Evening of 2/9, he progressed to respiratory distress / fatigue and required intubation.  He was a very difficult airway (anterior, short neck, small mouth).  Patient progressed to ARDS.  Remained intubated until 2/15 at which time he was liberated from mechanical ventilation.  Post extubation, he was continued on nocturnal CPAP.  Anti-hypertensive's were held in setting of acute renal failure secondary to sepsis / ACE at baseline.  Cardiac enzymes were cycled in setting of ARDS and were negative.  ECHO  2/11 demonstrated no significant disease.  EF mildly reduced at 40-45%.  Hospital course complicated delirium, constipation, thrombocytopenia and syncopal episode.     SIGNIFICANT EVENTS / STUDIES:  2/09 - Transfer  to [facility]  2/09 - intubation  2/12 - ambulated ICU  2/13 - precedex started  2/14 - failed precedex, back on propofol  2/17 - insomnia, CPAP (autoset, wears at home)   LINES / TUBES:  ETT 2/9 >> 2/15  R arterial line 2/9 >> 2/14  L ij cvc 2/9 >>   CULTURES:  Blood 2/8 >> negative  HIV 2/8 >> Non reactive  Influenza 2/8 >> negative  Pneumococcal Ag 2/8 >> negative  Legionella Ag 2/8 >> negative  Resp viral panel 2/9 >> Influenza A  ET Aspirate 2/8 >> negative  BAL 2/10 >> negative  Blood 2/14 >>  Urine 2/14 >>neg   ANTIBIOTICS:  Zithromax 2/8 >>2/10  Rocephin 2/8 >>2/10  Vancomycin 2/8 >>2/12  Tamiflu 2/8 >> plan stop 18th  Ceftazidime 2/10 >>2/13  Levofloxacin 2/10 >>2/12  Ceftriaxone 2/13 >>plan stop [facility] Summary by Discharge Diagnosis  Acute Respiratory Failure H1N1 influenza CAP ARDS (adult respiratory distress syndrome) OSA Initially presented to [facility] on 2/8 with dyspnea, cough, and fever (Tm 102.6), chills and body aches that felt like flu. He sought care from his PCP and was treated with levaquin.  He failed to improve after outpt Rx with levaquin for 2 days and  sought care in ER after progression of symptoms.  Work up demonstrated chest xray consistent with bilateral PNA, hypoxia despite 6L on BiPAP.  He was transferred to [facility] ICU 2/9 for further treatment.  He was empirically treated with anti-biotics, anti-virals and supported with BiPAP.  Pan-cultured and are negative to date.  Viral culture was positive for H1N1 (influenza A).  Evening of 2/9, he progressed to respiratory distress / fatigue and required intubation.  He was a very difficult airway (anterior, short neck, small mouth).  Patient progressed to ARDS.  Remained intubated until 2/15 at which time he was liberated from mechanical ventilation.  Post extubation, he was continued on nocturnal CPAP.   Discharge  Plan: -continue flonase (he was on Rhinocort at home, ok to continue) -QHS Nocturnal CPAP -recommend follow up PFT's in 6 months with Dr. Delton Coombes   HTN Syncopal Episode Known history of HTN on ACE prior to admit.  BP medications held on admission in setting of ARDS, acute renal insufficiency.  Post acute phase of ARDS, he was aggressively diuresed.  BP medications were not restarted as he was normotensive.   Two days prior to discharge, he was up to rest room and had episode of lightheadedness, felt like he was going to pass out.  He was able to be sat down without fall.  Most likely vasovagal episode.  Patient did have mild sclera icterus and noted pingueculum on R eye.  Cholesterol was screened due to eye findings and were elevated.  See labs below.   Discharge Plan: -hold ACE until follow up with PCP -recommend follow up elevated lipids with PCP   Acute Renal Insufficiency Thrombocytopenia Acute renal insufficiency in setting of critical illness. Renal function had returned to baseline prior to discharge. Platelets improved as well.  No further interventions at time of discharge.   Constipation Isolated episode of constipation, resolved with miralax.    Discharge Plan: -miralax PRN   Anxiety  Insomnia Deconditioning of critical illness Insomnia post extubation.  Trial of benadryl with some relief.  Profound deconditioning in setting of critical illness.   Discharge Plan: -recommend pt remain out of work for 6 weeks, can return sooner at discretion of PCP if quickly improves -no driving for 2 weeks -home PT -rolling walker for ambulation -ok to continue benadryl PRN for sleep, suspect this will resolve post discharge once back in home environment   Discharge Exam: General: NAD  Neuro: Alert, oriented, follows commands  HEENT: Mild icterus, pingueculum on R eye  Cardiovascular: s1s2 regular, no murmur  Lungs: resp's even/non-labored, lungs bilaterally clear  Abdomen: +BS,  distension improved, soft  Musculoskeletal: no edema  Skin: No rashes   Filed Vitals:   07/18/12 0645 07/18/12 1414 07/18/12 2228 07/19/12 0559  BP: 136/91 126/88 139/84 133/83  Pulse: 86 83 77 73  Temp: 98.1 F (36.7 C) 98.1 F (36.7 C) 98.8 F (37.1 C) 98.4 F (36.9 C)  TempSrc: Oral  Oral Oral  Resp: 18 18 18 18   Height:      Weight:      SpO2: 96% 98% 95% 98%     Discharge Labs  BMET  Recent Labs Lab 07/14/12 0830 07/15/12 0500 07/16/12 0440 07/18/12 0450 07/19/12 0445  NA 149* 147* 142 146* 145  K 3.6 3.4* 4.1 3.6 4.2  CL 107 105 105 109 108  CO2 33* 33* 31 28 28   GLUCOSE 145* 132* 125* 103* 97  BUN 39* 40* 29* 26* 26*  CREATININE 1.04  0.91 0.82 0.98 1.03  CALCIUM 8.8 8.5 9.0 8.8 9.1   CBC  Recent Labs Lab 07/16/12 0440 07/18/12 0450 07/19/12 0445  HGB 11.9* 11.9* 12.4*  HCT 37.3* 37.1* 38.3*  WBC 11.6* 10.3 10.5  PLT 420* 450* 471*   Lipid Panel     Component Value Date/Time   CHOL 217* 07/19/2012 0445   TRIG 225* 07/19/2012 0445   HDL 26* 07/19/2012 0445   CHOLHDL 8.3 07/19/2012 0445   VLDL 45* 07/19/2012 0445   LDLCALC 146* 07/19/2012 0445        Follow-up Information   Follow up with Leslye Peer., MD. (Office will call you for appointment in approximately 6 months for PFT's and follow up with Dr. Delton Coombes. )    Contact information:   [address] [place] [address] [phone]       Follow up with Lilyan Punt, MD On 07/21/2012. (Appt at 10:00 AM)    Contact information:   [address] [place] [address] [phone]          Medication List    STOP taking these medications       lisinopril 10 MG tablet  Commonly known as:  PRINIVIL,ZESTRIL      TAKE these medications       buPROPion 200 MG 12 hr tablet  Commonly known as:  WELLBUTRIN SR  Take 200 mg by mouth daily.     diphenhydrAMINE 25 mg capsule  Commonly known as:  BENADRYL  Take 1 capsule (25 mg total) by mouth at bedtime as needed for sleep.      esomeprazole 40 MG capsule  Commonly known as:  NEXIUM  Take 40 mg by mouth daily before breakfast.     fluticasone 50 MCG/ACT nasal spray  Commonly known as:  FLONASE  Place 1 spray into the nose daily.     phenol 1.4 % Liqd  Commonly known as:  CHLORASEPTIC  Use as directed 1 spray in the mouth or throat as needed.     polyethylene glycol packet  Commonly known as:  MIRALAX / GLYCOLAX  Take 17 g by mouth 2 (two) times daily as needed.          Disposition: Discharge to Home with home PT, recommend 3x per week minimum.  Rolling walker with 5 inch wheels.    Discharged Condition: Virgie Harl Favor has met maximum benefit of inpatient care and is medically stable and cleared for discharge.  Patient is pending follow up as above.      Time spent on disposition:  Greater than 35 minutes.   Signed: Canary Brim, NP-C [facility] Pgr: [phone]    Agree  Merwyn Katos, MD

## 2012-07-19 LAB — HEPATIC FUNCTION PANEL
ALT: 52 U/L (ref 0–53)
AST: 36 U/L (ref 0–37)
Bilirubin, Direct: 0.2 mg/dL (ref 0.0–0.3)
Total Bilirubin: 0.6 mg/dL (ref 0.3–1.2)

## 2012-07-19 LAB — CBC
MCHC: 32.4 g/dL (ref 30.0–36.0)
Platelets: 471 10*3/uL — ABNORMAL HIGH (ref 150–400)
RDW: 12.6 % (ref 11.5–15.5)

## 2012-07-19 LAB — BASIC METABOLIC PANEL
BUN: 26 mg/dL — ABNORMAL HIGH (ref 6–23)
Calcium: 9.1 mg/dL (ref 8.4–10.5)
Creatinine, Ser: 1.03 mg/dL (ref 0.50–1.35)
GFR calc Af Amer: >90 mL/min (ref >90)
GFR calc non Af Amer: 85 mL/min — ABNORMAL LOW (ref >90)

## 2012-07-19 LAB — LIPID PANEL
Cholesterol: 217 mg/dL — ABNORMAL HIGH (ref 0–200)
LDL Cholesterol: 146 mg/dL — ABNORMAL HIGH (ref 0–99)
Triglycerides: 225 mg/dL — ABNORMAL HIGH (ref <150)

## 2012-07-19 MED ORDER — PHENOL 1.4 % MT LIQD: 1.0000 | OROMUCOSAL | Status: DC | PRN

## 2012-07-19 MED ORDER — DIPHENHYDRAMINE HCL 25 MG PO CAPS: 25.0000 mg | ORAL_CAPSULE | Freq: Every evening | ORAL | Status: DC | PRN

## 2012-07-19 MED ORDER — POLYETHYLENE GLYCOL 3350 17 G PO PACK: 17.0000 g | PACK | Freq: Two times a day (BID) | ORAL | Status: DC | PRN

## 2012-07-19 MED ORDER — FLUTICASONE PROPIONATE 50 MCG/ACT NA SUSP: 1.0000 | Freq: Every day | NASAL | Status: DC

## 2012-07-19 NOTE — Progress Notes (Signed)
Physical Therapy Treatment Patient Details Name: Christopher Bowers MRN: [id number] DOB: 1965-07-07 Today's Date: 07/19/2012 Time: 5621-3086 PT Time Calculation (min): 28 min  PT Assessment / Plan / Recommendation Comments on Treatment Session  Pt demonstrates signficant improvements towards PT goals. Pt at Mod Independent level for activity and SpO2 levels appear to be sustained WNL.  Patient only had one slight desaturation after going up and down a full flight of stairs SpO2 went to 93% on rm air but rebounded to 95% within 30 seconds. Discussed at length expectations for increasing activity at home. Patient and spouse had no further concerns. Feel patient is safe for d/c home with spouse and rw.    Follow Up Recommendations  Supervision - Intermittent;Supervision for mobility/OOB     Does the patient have the potential to tolerate intense rehabilitation     Barriers to Discharge        Equipment Recommendations  Rolling walker with 5" wheels    Recommendations for Other Services    Frequency Min 3X/week   Plan Discharge plan needs to be updated    Precautions / Restrictions Precautions Precautions: Fall Restrictions Weight Bearing Restrictions: No   Pertinent Vitals/Pain No pain, SpO2 95%-98% on rm air with activity    Mobility  Transfers Transfers: Sit to Stand;Stand to Sit Sit to Stand: 6: Modified independent (Device/Increase time) Stand to Sit: 6: Modified independent (Device/Increase time) Ambulation/Gait Ambulation/Gait Assistance: 6: Modified independent (Device/Increase time) Ambulation Distance (Feet): 400 Feet Assistive device: Rolling walker Ambulation/Gait Assistance Details: SpO2 remained stable with ambulation and activity 95-98% rm air Gait Pattern: Step-through pattern;Decreased stride length Gait velocity: WFL Stairs: Yes Stairs Assistance: 5: Supervision Stair Management Technique: One rail Right;Alternating pattern;Forwards Number of Stairs: 12       PT Goals Acute Rehab PT Goals PT Goal: Sit to Stand - Progress: Met PT Transfer Goal: Bed to Chair/Chair to Bed - Progress: Met PT Goal: Ambulate - Progress: Met PT Goal: Up/Down Stairs - Progress: Progressing toward goal  Visit Information  Assistance Needed: +1    Subjective Data  Subjective: "i'm moving on up and ready to move out" Patient Stated Goal: to go home   Cognition  Cognition Overall Cognitive Status: Appears within functional limits for tasks assessed/performed Arousal/Alertness: Awake/alert Orientation Level: Appears intact for tasks assessed Behavior During Session: [facility] for tasks performed    Balance  Static Sitting Balance Static Sitting - Balance Support: Bilateral upper extremity supported;Feet supported Static Sitting - Level of Assistance: 7: Independent Static Standing Balance Static Standing - Balance Support: Bilateral upper extremity supported Static Standing - Level of Assistance: 6: Modified independent (Device/Increase time) Static Standing - Comment/# of Minutes: 4 minutes  End of Session PT - End of Session Equipment Utilized During Treatment: Gait belt;Oxygen Activity Tolerance: Patient tolerated treatment well Patient left: in chair;with call bell/phone within reach;with family/visitor present Nurse Communication: Mobility status;Need for lift equipment;Other (comment)   GP     [name] 07/19/2012, 8:41 AM [name], PT DPT  [phone]

## 2012-07-20 LAB — CULTURE, BLOOD (ROUTINE X 2)
Culture: NO GROWTH
Culture: NO GROWTH

## 2012-08-29 ENCOUNTER — Other Ambulatory Visit: Payer: Self-pay | Admitting: *Deleted

## 2012-08-29 ENCOUNTER — Telehealth: Payer: Self-pay | Admitting: Family Medicine

## 2012-08-29 DIAGNOSIS — R479 Unspecified speech disturbances: Secondary | ICD-10-CM

## 2012-08-29 NOTE — Telephone Encounter (Signed)
Order sent for speech therapy mom notified pt will call with appt.

## 2012-08-29 NOTE — Telephone Encounter (Signed)
Patient needs a speech therapy consult for cognitive therapy sent over to [facility] because they are concerned about him having memory problems.

## 2012-08-29 NOTE — Telephone Encounter (Signed)
Ok. Lets do 

## 2012-09-07 ENCOUNTER — Encounter: Payer: Self-pay | Admitting: *Deleted

## 2012-09-13 ENCOUNTER — Encounter: Payer: Self-pay | Admitting: Family Medicine

## 2012-09-13 ENCOUNTER — Ambulatory Visit (INDEPENDENT_AMBULATORY_CARE_PROVIDER_SITE_OTHER): Payer: 59 | Admitting: Family Medicine

## 2012-09-13 VITALS — BP 120/90 | HR 80 | Ht 70.25 in | Wt 239.0 lb

## 2012-09-13 DIAGNOSIS — R0600 Dyspnea, unspecified: Secondary | ICD-10-CM

## 2012-09-13 DIAGNOSIS — R0609 Other forms of dyspnea: Secondary | ICD-10-CM

## 2012-09-13 DIAGNOSIS — E669 Obesity, unspecified: Secondary | ICD-10-CM

## 2012-09-13 DIAGNOSIS — I10 Essential (primary) hypertension: Secondary | ICD-10-CM

## 2012-09-13 DIAGNOSIS — K219 Gastro-esophageal reflux disease without esophagitis: Secondary | ICD-10-CM

## 2012-09-13 DIAGNOSIS — E785 Hyperlipidemia, unspecified: Secondary | ICD-10-CM

## 2012-09-13 DIAGNOSIS — J9600 Acute respiratory failure, unspecified whether with hypoxia or hypercapnia: Secondary | ICD-10-CM

## 2012-09-13 DIAGNOSIS — Z79899 Other long term (current) drug therapy: Secondary | ICD-10-CM

## 2012-09-13 NOTE — Progress Notes (Signed)
  Subjective:    Patient ID: Christopher Bowers, male    DOB: [birth year], 47 y.o.   MRN: [id number]  HPI  History of reflux. Ave not stopped the nexium.  Working better on chol intake. Less red meat intake. Cut back on salt.  Not so much on exercise. Feels sob at times. Hx of ARDS.feels sob with exertion. Feels is related. Reports no difficulty with frank depression, however at times feels his thinking is still fuzzy since his hospitalization. 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 Wishes not to take medication at this time. Review of Systems  Constitutional: Negative for fever, activity change and appetite change.  HENT: Negative for congestion, rhinorrhea and neck pain.   Eyes: Negative for discharge.  Respiratory: Negative for cough and wheezing.   Cardiovascular: Negative for chest pain.  Gastrointestinal: Negative for vomiting, abdominal pain and blood in stool.  Genitourinary: Negative for frequency and difficulty urinating.  Skin: Negative for rash.  Allergic/Immunologic: Negative for environmental allergies and food allergies.  Neurological: Negative for weakness and headaches.  Psychiatric/Behavioral: Negative for agitation.       Objective:   Physical Exam Alert. HEENT normal. Vitals reviewed. Blood pressure 122/86 on repeat. Lungs clear. Heart regular rate and rhythm. Ankles without edema. Neuro intact. Alert oriented.       Assessment & Plan:  Impression #1 hypertension decent control off medication. #2 dyspnea may be related to ARDS, but certainly related to physical inactivity-discussed. #3 reflux improved patient would like to come off medication. #4 hyperlipidemia status uncertain. Plan appropriate blood work. Pulmonary function test. Recheck in several months. Further recommendations based on results. Easily 25 minutes spent most in discussion. WSL

## 2012-09-13 NOTE — Progress Notes (Signed)
PFT order put in at [facility]. Pt's wife schedules PFT at [facility] she requested to schedule appt for [place].

## 2012-09-18 ENCOUNTER — Encounter: Payer: Self-pay | Admitting: Emergency Medicine

## 2012-09-19 ENCOUNTER — Ambulatory Visit (HOSPITAL_COMMUNITY): Payer: 59 | Admitting: Speech Pathology

## 2012-09-26 ENCOUNTER — Ambulatory Visit (HOSPITAL_COMMUNITY): Payer: 59 | Admitting: Speech Pathology

## 2012-09-26 ENCOUNTER — Telehealth (HOSPITAL_COMMUNITY): Payer: Self-pay

## 2012-12-06 ENCOUNTER — Ambulatory Visit (HOSPITAL_COMMUNITY)
Admission: RE | Admit: 2012-12-06 | Discharge: 2012-12-06 | Disposition: A | Discharge status: Home / Self Care | Payer: 59 | Source: Ambulatory Visit | Attending: Family Medicine | Admitting: Family Medicine

## 2012-12-06 DIAGNOSIS — R0609 Other forms of dyspnea: Secondary | ICD-10-CM | POA: Insufficient documentation

## 2012-12-06 DIAGNOSIS — R0989 Other specified symptoms and signs involving the circulatory and respiratory systems: Secondary | ICD-10-CM | POA: Insufficient documentation

## 2012-12-06 MED ORDER — ALBUTEROL SULFATE (5 MG/ML) 0.5% IN NEBU
2.5000 mg | INHALATION_SOLUTION | Freq: Once | RESPIRATORY_TRACT | Status: AC
  Administered 2012-12-06: 2.5 mg via RESPIRATORY_TRACT

## 2012-12-07 ENCOUNTER — Encounter: Payer: Self-pay | Admitting: Family Medicine

## 2012-12-07 ENCOUNTER — Ambulatory Visit (INDEPENDENT_AMBULATORY_CARE_PROVIDER_SITE_OTHER): Payer: 59 | Admitting: Family Medicine

## 2012-12-07 VITALS — BP 110/76 | Temp 98.5°F | Wt 243.0 lb

## 2012-12-07 DIAGNOSIS — J329 Chronic sinusitis, unspecified: Secondary | ICD-10-CM

## 2012-12-07 DIAGNOSIS — J310 Chronic rhinitis: Secondary | ICD-10-CM

## 2012-12-07 MED ORDER — CEFPROZIL 500 MG PO TABS: 500.0000 mg | ORAL_TABLET | Freq: Two times a day (BID) | ORAL | Status: DC

## 2012-12-07 NOTE — Progress Notes (Signed)
  Subjective:    Patient ID: Christopher Bowers, male    DOB: 1965-11-15, 47 y.o.   MRN: [id number]  HPI Patient presents with frontal headache. Nasal congestion. Left-sided nasal tenderness. History of using her Flonase. No fever. Some congestion. No cough no sore throat. Just had his pulmonary function tests yesterday.   Review of Systems No vomiting no diarrhea no rash no tick bite.    Objective:   Physical Exam  Alert decent hydration. TMs normal. Frontal tenderness. Left-sided nostril tenderness. Pharynx normal neck supple lungs clear heart rare rhythm.      Assessment & Plan:  Impression rhinosinusitis. Plan Cefzil 500 twice a day 10 days. Symptomatic care discussed. Followup for chronic concerns. WSL

## 2012-12-11 ENCOUNTER — Telehealth: Payer: Self-pay | Admitting: Family Medicine

## 2012-12-11 NOTE — Telephone Encounter (Signed)
Patient says pharmacy hasnt received antibiotic from Thursday when patient was seen and patient would like this called into [facility] to have it delivered to [facility].

## 2012-12-11 NOTE — Telephone Encounter (Signed)
Med is at [facility] waiting for pt to pick up. Pt notified

## 2013-01-09 ENCOUNTER — Ambulatory Visit: Payer: 59 | Admitting: Family Medicine

## 2013-01-18 ENCOUNTER — Inpatient Hospital Stay: Payer: 59 | Admitting: Emergency Medicine

## 2013-03-14 ENCOUNTER — Other Ambulatory Visit: Payer: Self-pay | Admitting: Family Medicine

## 2013-03-14 LAB — LIPID PANEL
Cholesterol: 177 mg/dL (ref 0–200)
HDL: 42 mg/dL (ref >39)
Total CHOL/HDL Ratio: 4.2 Ratio
Triglycerides: 156 mg/dL — ABNORMAL HIGH (ref <150)
VLDL: 31 mg/dL (ref 0–40)

## 2013-03-14 LAB — BASIC METABOLIC PANEL
Calcium: 9.8 mg/dL (ref 8.4–10.5)
Potassium: 4.9 mEq/L (ref 3.5–5.3)
Sodium: 141 mEq/L (ref 135–145)

## 2013-03-22 ENCOUNTER — Encounter: Payer: Self-pay | Admitting: Family Medicine

## 2013-04-10 ENCOUNTER — Other Ambulatory Visit: Payer: Self-pay | Admitting: *Deleted

## 2013-04-10 MED ORDER — BUPROPION HCL ER (SR) 200 MG PO TB12: 200.0000 mg | ORAL_TABLET | Freq: Every day | ORAL | Status: DC

## 2013-04-10 MED ORDER — ESOMEPRAZOLE MAGNESIUM 40 MG PO CPDR: 40.0000 mg | DELAYED_RELEASE_CAPSULE | Freq: Every day | ORAL | Status: DC

## 2013-04-11 ENCOUNTER — Ambulatory Visit (INDEPENDENT_AMBULATORY_CARE_PROVIDER_SITE_OTHER): Payer: 59 | Admitting: Family Medicine

## 2013-04-11 ENCOUNTER — Encounter: Payer: Self-pay | Admitting: Family Medicine

## 2013-04-11 VITALS — BP 150/108 | Temp 98.9°F | Ht 71.0 in | Wt 245.0 lb

## 2013-04-11 DIAGNOSIS — J019 Acute sinusitis, unspecified: Secondary | ICD-10-CM

## 2013-04-11 DIAGNOSIS — R030 Elevated blood-pressure reading, without diagnosis of hypertension: Secondary | ICD-10-CM

## 2013-04-11 DIAGNOSIS — J069 Acute upper respiratory infection, unspecified: Secondary | ICD-10-CM

## 2013-04-11 DIAGNOSIS — IMO0001 Reserved for inherently not codable concepts without codable children: Secondary | ICD-10-CM

## 2013-04-11 MED ORDER — AZITHROMYCIN 250 MG PO TABS: ORAL_TABLET | ORAL | Status: DC

## 2013-04-11 MED ORDER — BUPROPION HCL ER (SR) 200 MG PO TB12: 200.0000 mg | ORAL_TABLET | Freq: Every day | ORAL | Status: DC

## 2013-04-11 MED ORDER — ALBUTEROL SULFATE HFA 108 (90 BASE) MCG/ACT IN AERS: 2.0000 | INHALATION_SPRAY | Freq: Four times a day (QID) | RESPIRATORY_TRACT | Status: DC | PRN

## 2013-04-11 NOTE — Patient Instructions (Signed)
DASH Diet  The DASH diet stands for "Dietary Approaches to Stop Hypertension." It is a healthy eating plan that has been shown to reduce high blood pressure (hypertension) in as little as 14 days, while also possibly providing other significant health benefits. These other health benefits include reducing the risk of breast cancer after menopause and reducing the risk of type 2 diabetes, heart disease, colon cancer, and stroke. Health benefits also include weight loss and slowing kidney failure in patients with chronic kidney disease.   DIET GUIDELINES  · Limit salt (sodium). Your diet should contain less than 1500 mg of sodium daily.  · Limit refined or processed carbohydrates. Your diet should include mostly whole grains. Desserts and added sugars should be used sparingly.  · Include small amounts of heart-healthy fats. These types of fats include nuts, oils, and tub margarine. Limit saturated and trans fats. These fats have been shown to be harmful in the body.  CHOOSING FOODS   The following food groups are based on a 2000 calorie diet. See your Registered Dietitian for individual calorie needs.  Grains and Grain Products (6 to 8 servings daily)  · Eat More Often: Whole-wheat bread, brown rice, whole-grain or wheat pasta, quinoa, popcorn without added fat or salt (air popped).  · Eat Less Often: White bread, white pasta, white rice, cornbread.  Vegetables (4 to 5 servings daily)  · Eat More Often: Fresh, frozen, and canned vegetables. Vegetables may be raw, steamed, roasted, or grilled with a minimal amount of fat.  · Eat Less Often/Avoid: Creamed or fried vegetables. Vegetables in a cheese sauce.  Fruit (4 to 5 servings daily)  · Eat More Often: All fresh, canned (in natural juice), or frozen fruits. Dried fruits without added sugar. One hundred percent fruit juice (½ cup [237 mL] daily).  · Eat Less Often: Dried fruits with added sugar. Canned fruit in light or heavy syrup.  Lean Meats, Fish, and Poultry (2  servings or less daily. One serving is 3 to 4 oz [85-114 g]).  · Eat More Often: Ninety percent or leaner ground beef, tenderloin, sirloin. Round cuts of beef, chicken breast, turkey breast. All fish. Grill, bake, or broil your meat. Nothing should be fried.  · Eat Less Often/Avoid: Fatty cuts of meat, turkey, or chicken leg, thigh, or wing. Fried cuts of meat or fish.  Dairy (2 to 3 servings)  · Eat More Often: Low-fat or fat-free milk, low-fat plain or light yogurt, reduced-fat or part-skim cheese.  · Eat Less Often/Avoid: Milk (whole, 2%). Whole milk yogurt. Full-fat cheeses.  Nuts, Seeds, and Legumes (4 to 5 servings per week)  · Eat More Often: All without added salt.  · Eat Less Often/Avoid: Salted nuts and seeds, canned beans with added salt.  Fats and Sweets (limited)  · Eat More Often: Vegetable oils, tub margarines without trans fats, sugar-free gelatin. Mayonnaise and salad dressings.  · Eat Less Often/Avoid: Coconut oils, palm oils, butter, stick margarine, cream, half and half, cookies, candy, pie.  FOR MORE INFORMATION  The Dash Diet Eating Plan: www.dashdiet.org  Document Released: 05/06/2011 Document Revised: 08/09/2011 Document Reviewed: 05/06/2011  ExitCare® Patient Information ©2014 [employer].

## 2013-04-11 NOTE — Progress Notes (Signed)
  Subjective:    Patient ID: Christopher Bowers, male    DOB: [birth year], 47 y.o.   MRN: [id number]  Cough This is a new problem. The current episode started in the past 7 days. Associated symptoms include a fever, headaches, myalgias and wheezing.   patient relates cough congestion some wheezing denies any vomiting diarrhea Time was spent reviewing his notes from back in February when he had a 20 and 1 flu and had respiratory failure he is worried that he could be heading down the same path.  He states ever since having respiratory for your he does have intermittent expiratory wheezes with exercise. He does not smoke He has been exercising until recently when he stopped Was taken blood pressure medicine but then after coming out of hospital blood pressure is good he stopped taking it. They do not check blood pressure home. He denies chest tightness pressure pain headache shortness of breath. Review of Systems  Constitutional: Positive for fever.  Respiratory: Positive for cough and wheezing.   Musculoskeletal: Positive for myalgias.  Neurological: Positive for headaches.       Objective:   Physical Exam  Minimal expiratory wheezes not respiratory distress heart is regular neck no masses sinus nontender throat normal  Blood pressure on recheck 134 over and 100    Assessment & Plan:  #1 elevated blood pressure-he is to do better with diet information given today exercise on a regular basis followup within the next several weeks for a wellness exam and recheck her blood pressure more than likely will need to start medication. 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

## 2013-04-16 ENCOUNTER — Other Ambulatory Visit: Payer: Self-pay | Admitting: *Deleted

## 2013-04-16 ENCOUNTER — Telehealth: Payer: Self-pay | Admitting: Family Medicine

## 2013-04-16 MED ORDER — LEVOFLOXACIN 500 MG PO TABS: 500.0000 mg | ORAL_TABLET | Freq: Every day | ORAL | Status: AC

## 2013-04-16 MED ORDER — ESOMEPRAZOLE MAGNESIUM 40 MG PO CPDR: 40.0000 mg | DELAYED_RELEASE_CAPSULE | Freq: Two times a day (BID) | ORAL | Status: DC

## 2013-04-16 NOTE — Telephone Encounter (Signed)
Medication sent in to pharmacy. Patient was notified.  

## 2013-04-16 NOTE — Telephone Encounter (Signed)
Sorry nurse to spk with

## 2013-04-16 NOTE — Telephone Encounter (Signed)
Patient called back stating he would like to talk to Dr Brett Canales directly.

## 2013-04-16 NOTE — Telephone Encounter (Signed)
levaquin 500 qd for ten d 

## 2013-04-16 NOTE — Telephone Encounter (Signed)
Patient was seen last Wednesday. He states that lungs are still congested and sinuses somewhat bothering him. Can we call something in stronger? Also, he is concerned he may need an xray.  [employer] [place]

## 2013-04-16 NOTE — Telephone Encounter (Signed)
Patient was seen on 04/11/13 for URI and prescribed Zpak. He is still experiencing cough, chest congestion and wheezing. No shortness of breath. He thinks he needs a stronger antibiotic sent in.

## 2013-04-17 ENCOUNTER — Encounter: Payer: Self-pay | Admitting: Internal Medicine

## 2013-04-17 ENCOUNTER — Ambulatory Visit (INDEPENDENT_AMBULATORY_CARE_PROVIDER_SITE_OTHER): Payer: 59 | Admitting: Internal Medicine

## 2013-04-17 ENCOUNTER — Ambulatory Visit (INDEPENDENT_AMBULATORY_CARE_PROVIDER_SITE_OTHER)
Admission: RE | Admit: 2013-04-17 | Discharge: 2013-04-17 | Disposition: A | Discharge status: Home / Self Care | Payer: 59 | Source: Ambulatory Visit | Attending: Internal Medicine | Admitting: Internal Medicine

## 2013-04-17 VITALS — BP 150/100 | HR 75 | Temp 99.2°F | Ht 71.0 in | Wt 244.0 lb

## 2013-04-17 DIAGNOSIS — J111 Influenza due to unidentified influenza virus with other respiratory manifestations: Secondary | ICD-10-CM

## 2013-04-17 DIAGNOSIS — R05 Cough: Secondary | ICD-10-CM

## 2013-04-17 DIAGNOSIS — I10 Essential (primary) hypertension: Secondary | ICD-10-CM

## 2013-04-17 DIAGNOSIS — R059 Cough, unspecified: Secondary | ICD-10-CM

## 2013-04-17 MED ORDER — PREDNISONE (PAK) 10 MG PO TABS: ORAL_TABLET | ORAL | Status: DC

## 2013-04-17 NOTE — Patient Instructions (Addendum)
Stop the allegra D and all decongestants  If still congested /wheezing > Prednisone 10 mg take  4 each am x 2 days,   2 each am x 2 days,  1 each am x 2 days and stop   In the event of any respiratory flare, take Nexium 40 mg Take 30- 60 min before your first and last meals of the day until better for a week  GERD (REFLUX)  is an extremely common cause of respiratory symptoms, many times with no significant heartburn at all.    It can be treated with medication, but also with lifestyle changes including avoidance of late meals, excessive alcohol, smoking cessation, and avoid fatty foods, chocolate, peppermint, colas, red wine, and acidic juices such as orange juice.  NO MINT OR MENTHOL PRODUCTS SO NO COUGH DROPS  USE SUGARLESS CANDY INSTEAD (jolley ranchers or Stover's)  NO OIL BASED VITAMINS - use powdered substitutes.   Please remember to go to the  x-ray department downstairs for your tests - we will call you with the results when they are available.

## 2013-04-17 NOTE — Progress Notes (Signed)
  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  04/17/2013 1st [facility] office visit/ Wert cc persistent cough with mucus turning lighter since onset 04/09/13 and fever gone. Cough is worst at hs and in am, no sob, rigors, arthralgias or pleuritic cp. Still  Feels wheezy with nasal and throat congestion and using lots of otcs' including allegra D with elevations in BP noted.  Was very anxious re dx of ards that history was not repeating itself so self referred back to pulmonary   No obvious day to day or daytime variabilty or assoc  r chest tightness,   Overt  hb symptoms. No unusual exp hx or h/o childhood pna/ asthma or knowledge of premature birth.  Sleeping ok without nocturnal  or early am exacerbation  of respiratory  c/o's or need for noct saba. Also denies any obvious fluctuation of symptoms with weather or environmental changes or other aggravating or alleviating factors except as outlined above   Current Medications, Allergies, Complete Past Medical History, Past Surgical History, Family History, and Social History were reviewed in [employer] record.             Review of Systems  Constitutional: Negative for fever and unexpected weight change.  HENT: Negative for congestion, dental problem, ear pain, nosebleeds, postnasal drip, rhinorrhea, sinus pressure, sneezing, sore throat and trouble swallowing.   Eyes: Negative for redness and itching.  Respiratory: Positive for cough. Negative for chest tightness, shortness of breath and wheezing.    Cardiovascular: Negative for palpitations and leg swelling.  Gastrointestinal: Negative for nausea and vomiting.  Genitourinary: Negative for dysuria.  Musculoskeletal: Negative for joint swelling.  Skin: Negative for rash.  Neurological: Negative for headaches.  Hematological: Does not bruise/bleed easily.  Psychiatric/Behavioral: Negative for dysphoric mood. The patient is not nervous/anxious.        Objective:   Physical Exam   Wt Readings from Last 3 Encounters:  04/17/13 244 lb (110.678 kg)  04/11/13 245 lb (111.131 kg)  12/07/12 243 lb (110.224 kg)       HEENT: nl dentition, turbinates, and orophanx. Nl external ear canals without cough reflex   NECK :  without JVD/Nodes/TM/ nl carotid upstrokes bilaterally   LUNGS: no acc muscle use, clear to A and P bilaterally without cough on insp or exp maneuvers   CV:  RRR  no s3 or murmur or increase in P2, no edema   ABD:  soft and nontender with nl excursion in the supine position. No bruits or organomegaly, bowel sounds nl  MS:  warm without deformities, calf tenderness, cyanosis or clubbing  SKIN: warm and dry without lesions    NEURO:  alert, approp, no deficits    CXR  04/17/2013 :    No acute cardiopulmonary process.     Assessment & Plan:

## 2013-04-19 DIAGNOSIS — R05 Cough: Secondary | ICD-10-CM | POA: Insufficient documentation

## 2013-04-19 DIAGNOSIS — R058 Other specified cough: Secondary | ICD-10-CM | POA: Insufficient documentation

## 2013-04-19 NOTE — Assessment & Plan Note (Signed)
Aggravated by use of multiple decongestants which I rec he stop and monitor his bp carefully with f/u by Dr Gerda Diss prn

## 2013-04-19 NOTE — Assessment & Plan Note (Signed)
Assured him this wasn't influenza and not evolving back to ards

## 2013-04-19 NOTE — Assessment & Plan Note (Signed)
No evidence of CAP and I assured the pt we would have treated him exactly the way Dr Gerda Diss did   I note that he has baseline gerd and so I explained natural history of uri and why it's necessary in patients at risk to treat GERD aggressively  at least  short term   to reduce risk of evolving cyclical cough initially  triggered by epithelial injury and a heightened sensitivty to the effects of any upper airway irritants,  most importantly acid - related.  That is, the more sensitive the epithelium damaged for virus, the more the cough, the more the secondary reflux (especially in those prone to reflux) the more the irritation of the sensitive mucosa and so on in a cyclical pattern.   Pulmonary f/u is as needed.

## 2013-05-09 ENCOUNTER — Encounter: Payer: Self-pay | Admitting: Family Medicine

## 2013-05-09 ENCOUNTER — Ambulatory Visit (INDEPENDENT_AMBULATORY_CARE_PROVIDER_SITE_OTHER): Payer: 59 | Admitting: Family Medicine

## 2013-05-09 VITALS — BP 122/84 | Ht 71.0 in | Wt 246.2 lb

## 2013-05-09 DIAGNOSIS — J9600 Acute respiratory failure, unspecified whether with hypoxia or hypercapnia: Secondary | ICD-10-CM

## 2013-05-09 DIAGNOSIS — F3289 Other specified depressive episodes: Secondary | ICD-10-CM

## 2013-05-09 DIAGNOSIS — Z23 Encounter for immunization: Secondary | ICD-10-CM

## 2013-05-09 DIAGNOSIS — I10 Essential (primary) hypertension: Secondary | ICD-10-CM

## 2013-05-09 DIAGNOSIS — F32A Depression, unspecified: Secondary | ICD-10-CM | POA: Insufficient documentation

## 2013-05-09 DIAGNOSIS — F329 Major depressive disorder, single episode, unspecified: Secondary | ICD-10-CM

## 2013-05-09 MED ORDER — BUPROPION HCL ER (SR) 200 MG PO TB12: 200.0000 mg | ORAL_TABLET | Freq: Two times a day (BID) | ORAL | Status: DC

## 2013-05-09 NOTE — Progress Notes (Signed)
   Subjective:    Patient ID: Christopher Bowers, male    DOB: Aug 31, 1965, 47 y.o.   MRN: [id number]  Hypertension This is a recurrent problem. The current episode started more than 1 year ago. The problem has been rapidly improving since onset. The problem is controlled. There are no associated agents to hypertension. There are no known risk factors for coronary artery disease. Past treatments include nothing. The current treatment provides significant improvement. There are no compliance problems.   Patient states he has no concerns at this time. Doing very well.   Not exercising currently, injured hip and heel. 5k every day for a week.  BP generally good. When cked elsewhere.  Reflux is an ongoing issue.  Rarely develops wheezing at this time. Generally fairly stable.  Pt notes challenges with depresion have been concerning, somewhat worse. No suicidal thoughts. No homicidal thoughts. Just taking one Wellbutrin per day. Feels he may need more. Often has difficulty focusing and concentrating. States this is definitely become a substantial issue since his life-threatening illness last year. See prior notes. At the same time, notes that he has had a lifelong tendency towards depression at times.  Review of Systems No chest pain no back pain no abdominal pain no change in bowel habits no blood in stools ROS otherwise negative    Objective:   Physical Exam  Alert no acute distress. HEENT normal. Lungs clear. Heart regular in rhythm. Blood pressure good on repeat.      Assessment & Plan:  Impression 1 hypertension good control off meds. #2 reactive airways ongoing somewhat worse since ARDS E. vents but generally stable. #3 reflux discussed #4 depression somewhat worse. #5 focusing issues post ARDS ongoing. Plan double up on the Wellbutrin rationale discussed. Diet exercise discussed. Cut down salt. Regular exercise encourage. Recheck this 3 months. Will reassess how things are going at that point  with new while pitching dose. WSL

## 2013-05-09 NOTE — Patient Instructions (Signed)
Please double up your wellbutrin, try to exercise regularly

## 2013-06-21 ENCOUNTER — Telehealth: Payer: Self-pay | Admitting: Family Medicine

## 2013-06-21 MED ORDER — BUDESONIDE 32 MCG/ACT NA SUSP
2.0000 | Freq: Every day | NASAL | Status: DC
Start: 2013-06-21 — End: 2015-04-14

## 2013-06-21 NOTE — Telephone Encounter (Signed)
Rx sent electronically to pharmacy. Patient notified. 

## 2013-06-21 NOTE — Telephone Encounter (Signed)
2 sprays ea nost qd

## 2013-06-21 NOTE — Telephone Encounter (Signed)
Patient needs Rx for Rhinocort nasal spray   [facility]

## 2013-06-21 NOTE — Telephone Encounter (Signed)
Needs directions please

## 2013-06-28 ENCOUNTER — Encounter: Payer: Self-pay | Admitting: Family Medicine

## 2013-06-28 ENCOUNTER — Ambulatory Visit (INDEPENDENT_AMBULATORY_CARE_PROVIDER_SITE_OTHER): Payer: 59 | Admitting: Family Medicine

## 2013-06-28 VITALS — BP 130/90 | Temp 98.6°F | Ht 71.0 in | Wt 247.5 lb

## 2013-06-28 DIAGNOSIS — J310 Chronic rhinitis: Secondary | ICD-10-CM

## 2013-06-28 DIAGNOSIS — J329 Chronic sinusitis, unspecified: Secondary | ICD-10-CM

## 2013-06-28 DIAGNOSIS — R413 Other amnesia: Secondary | ICD-10-CM

## 2013-06-28 MED ORDER — LEVOFLOXACIN 500 MG PO TABS: 500.0000 mg | ORAL_TABLET | Freq: Every day | ORAL | Status: DC

## 2013-06-28 NOTE — Progress Notes (Signed)
   Subjective:    Patient ID: Christopher Bowers, male    DOB: 1965/09/16, 48 y.o.   MRN: [id number]  URI  This is a new problem. The current episode started more than 1 month ago. The problem has been unchanged. There has been no fever. Associated symptoms include congestion, coughing and wheezing. He has tried decongestant for the symptoms. The treatment provided no relief.   Congested at times  Wheezy intermittently  No trouble breathing  Please see below for additional issue and assessment and plan. Review of Systems  HENT: Positive for congestion.   Respiratory: Positive for cough and wheezing.        Objective:   Physical Exam  Alert mild malaise. H&T moderate his congestion frontal tenderness neck supple. Lungs clear heart regular in rhythm. Blood pressure good on repeat.      Assessment & Plan:  Impression 1 rhinosinusitis with history of acute respiratory distress syndrome this 1 year ago. #2 memory issues patient brought this up towards in the visit went up adding another 15-20 minutes and discussion. Since his event a year ago which led nearly to his death. Patient has been noting a lot more difficulty with memory particular short-term memory. At times frustrated with the. Claims no frank depression no suicidal thoughts. He does note diminished alertness and focusing at times. Somewhat frustrating to him. Wonders if he needs dementia workup. I advised him at his excellent level discussion he definitely does not he has good judgment and mental functioning despite nearly gone year ago from adult respiratory distress syndrome I advised patient if he continues to be frustrated about his level of mental functioning would recommend referral to a neurologist with expertise in brain injury. Discussed at length. 25 minutes spent most in discussion. WSL

## 2013-07-01 DIAGNOSIS — R413 Other amnesia: Secondary | ICD-10-CM | POA: Insufficient documentation

## 2013-07-03 ENCOUNTER — Telehealth: Payer: Self-pay | Admitting: Family Medicine

## 2013-07-03 DIAGNOSIS — R413 Other amnesia: Secondary | ICD-10-CM

## 2013-07-03 NOTE — Telephone Encounter (Signed)
Patient would like to go through with eval and treat order for short term memory

## 2013-07-04 NOTE — Telephone Encounter (Signed)
Nurses--see note back to [place]. 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 plz have brendale do

## 2013-07-04 NOTE — Telephone Encounter (Signed)
Christopher Bowers, call pt back and let him know that it usually takes a couple of months to get in to this kind of specialist. This problem has been going on for a year, I told him to notify us when ready for a specialist to evaluate, you in turn should also counsel a little patience on his part. Tell him to ck back with [place] in 7 days if he hasn't heard I did not swamp brendale yest since she has not had a chance to do even urgent referrals (BUT please don't tell pt that).

## 2013-07-04 NOTE — Telephone Encounter (Signed)
Pt calling to see if we have worked on this, advised him that we have not and that Christopher Bowers is waiting on your order

## 2013-07-05 NOTE — Telephone Encounter (Signed)
Please initiate referral to neurology in the system so that I may process

## 2013-07-08 NOTE — Telephone Encounter (Signed)
This bounced back to me nurses plz do ref if not done

## 2013-07-19 ENCOUNTER — Ambulatory Visit: Payer: 59 | Admitting: Neurology

## 2013-08-02 ENCOUNTER — Encounter: Payer: Self-pay | Admitting: Neurology

## 2013-08-02 ENCOUNTER — Encounter (INDEPENDENT_AMBULATORY_CARE_PROVIDER_SITE_OTHER): Payer: Self-pay

## 2013-08-02 ENCOUNTER — Ambulatory Visit (INDEPENDENT_AMBULATORY_CARE_PROVIDER_SITE_OTHER): Payer: 59 | Admitting: Neurology

## 2013-08-02 VITALS — BP 140/95 | HR 82 | Ht 72.0 in | Wt 250.0 lb

## 2013-08-02 DIAGNOSIS — R413 Other amnesia: Secondary | ICD-10-CM | POA: Insufficient documentation

## 2013-08-02 NOTE — Progress Notes (Signed)
Reason for visit: Memory disorder  Christopher Bowers is a 48 y.o. male  History of present illness:  Christopher Bowers is a 48 year old right-handed white male with a history of sleep apnea on CPAP. The patient indicates that he was admitted to the hospital around 07/08/2012 with H1N1 influenza that resulted in pneumonia and ARDS. The patient required intubation. Blood work evaluations did not show severe hypoxia any time during the admission. No mention of severe hypotension was noted. The patient did sustain a delirium state with some agitation and hallucinations. The patient did have transient acute renal failure. The patient recovered fairly well from this event. Coming out of the hospital, the patient immediately had some issues with memory and concentration. These issues did improve over several months, but the patient indicates that he has never returned to his usual baseline. The patient remains on CPAP, and he denies any significant increase in excessive daytime drowsiness or fatigue. The patient reports difficulty with focus, and some problems retaining new information. The patient has given up handling the medical supplies for his son who has type 1 diabetes, as he could not manage this issue. The patient does not do the bills, but he does have some issues keeping up with appointments and medications. The patient denies any issues with directions while driving. The patient reports no focal numbness or weakness of the face, arms, or legs with the exception that the right hand is numb at times. The patient denies problems controlling the bowels or the bladder, and he denies any balance issues. The patient reports very occasional headaches, but this is not a significant issue for him. The patient comes to this office for an evaluation. The patient does report some mild depression and anxiety issues.  Past Medical History  Diagnosis Date  . Hypertension   . OSA (obstructive sleep apnea)   . Acid reflux     . Gastritis, bile acid reflux   . Memory change 08/02/2013    Past Surgical History  Procedure Laterality Date  . Tonsillectomy    . Refractive surgery Bilateral     Family History  Problem Relation Age of Onset  . Diabetes Father   . Heart disease Father   . Diabetes Son     Social history:  reports that he has never smoked. He has never used smokeless tobacco. He reports that he drinks alcohol. He reports that he does not use illicit drugs.  Medications:  Current Outpatient Prescriptions on File Prior to Visit  Medication Sig Dispense Refill  . albuterol (PROVENTIL HFA;VENTOLIN HFA) 108 (90 BASE) MCG/ACT inhaler Inhale 2 puffs into the lungs every 6 (six) hours as needed for wheezing.  1 Inhaler  2  . budesonide (RHINOCORT AQUA) 32 MCG/ACT nasal spray Place 2 sprays into both nostrils daily.  8.6 g  3  . buPROPion (WELLBUTRIN SR) 200 MG 12 hr tablet Take 1 tablet (200 mg total) by mouth 2 (two) times daily.  180 tablet  1  . esomeprazole (NEXIUM) 40 MG capsule Take 1 capsule (40 mg total) by mouth 2 (two) times daily before a meal.  180 capsule  0   No current facility-administered medications on file prior to visit.      Allergies  Allergen Reactions  . Augmentin [Amoxicillin-Pot Clavulanate]   . Flonase [Fluticasone Propionate]   . Amoxicillin-Pot Clavulanate Rash    ROS:  Out of a complete 14 system review of symptoms, the patient complains only of the following symptoms,  and all other reviewed systems are negative.  Memory loss, confusion Depression, anxiety Disinterest in activities  Blood pressure 140/95, pulse 82, height 6' (1.829 m), weight 250 lb (113.399 kg).  Physical Exam  General: The patient is alert and cooperative at the time of the examination. The patient is moderately obese.  Eyes: Pupils are equal, round, and reactive to light. Discs are flat bilaterally.  Neck: The neck is supple, no carotid bruits are noted.  Respiratory: The respiratory  examination is clear.  Cardiovascular: The cardiovascular examination reveals a regular rate and rhythm, no obvious murmurs or rubs are noted.  Skin: Extremities are without significant edema.  Neurologic Exam  Mental status: The patient is alert and oriented x 3 at the time of the examination. The patient has apparent normal recent and remote memory, with an apparently normal attention span and concentration ability. Mini-Mental status examination done today shows a total score of 30/30.  Cranial nerves: Facial symmetry is present. There is good sensation of the face to pinprick and soft touch bilaterally. The strength of the facial muscles and the muscles to head turning and shoulder shrug are normal bilaterally. Speech is well enunciated, no aphasia or dysarthria is noted. Extraocular movements are full. Visual fields are full. The tongue is midline, and the patient has symmetric elevation of the soft palate. No obvious hearing deficits are noted.  Motor: The motor testing reveals 5 over 5 strength of all 4 extremities. Good symmetric motor tone is noted throughout.  Sensory: Sensory testing is intact to pinprick, soft touch, vibration sensation, and position sense on all 4 extremities. No evidence of extinction is noted.  Coordination: Cerebellar testing reveals good finger-nose-finger and heel-to-shin bilaterally.  Gait and station: Gait is normal. Tandem gait is normal. Romberg is negative. No drift is seen.  Reflexes: Deep tendon reflexes are symmetric and normal bilaterally. Toes are downgoing bilaterally.   Assessment/Plan:  1. Memory disturbance  2. Obstructive sleep apnea on CPAP  3. History of mild delirium state associated with ARDS  4. 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 The patient did have a delirium state which may be associated with  long-term cognitive issues. The patient reports mainly issues with maintaining focus, and it is possible that medication such as Adderall may be helpful in the future. The patient will undergo MRI evaluation of the brain, and further blood work today. The patient will undergo formal neuropsychological evaluation. The patient will be followed over time for his cognitive issues, and he will followup in one year. We may give a trial on Adderall in the future.  Jill Alexanders MD 08/02/2013 7:22 PM  [facility] [address] [place], [place] [address]  Phone [phone] Fax [phone]

## 2013-08-03 ENCOUNTER — Telehealth: Payer: Self-pay | Admitting: Neurology

## 2013-08-03 LAB — TSH: TSH: 6.19 u[IU]/mL — ABNORMAL HIGH (ref 0.450–4.500)

## 2013-08-03 LAB — SYPHILIS: RPR W/REFLEX TO RPR TITER AND TREPONEMAL ANTIBODIES, TRADITIONAL SCREENING AND DIAGNOSIS ALGORITHM: RPR: NONREACTIVE

## 2013-08-03 LAB — VITAMIN B12: Vitamin B-12: 669 pg/mL (ref 211–946)

## 2013-08-03 LAB — COPPER, SERUM: Copper: 124 ug/dL (ref 72–166)

## 2013-08-03 LAB — HIV ANTIBODY (ROUTINE TESTING W REFLEX)
HIV 1/O/2 Abs-Index Value: <1
HIV-1/HIV-2 Ab: NONREACTIVE

## 2013-08-03 NOTE — Telephone Encounter (Signed)
Blood work was unremarkable. I called the patient. Thyroid study was slightly low, this is to be followed. I'll call the patient once the MRI results and the neuropsychological testing is available.

## 2013-08-07 ENCOUNTER — Other Ambulatory Visit (HOSPITAL_COMMUNITY): Payer: 59

## 2013-08-08 ENCOUNTER — Ambulatory Visit: Payer: 59 | Admitting: Family Medicine

## 2013-08-14 ENCOUNTER — Telehealth: Payer: Self-pay | Admitting: Neurology

## 2013-08-14 ENCOUNTER — Ambulatory Visit (HOSPITAL_COMMUNITY)
Admission: RE | Admit: 2013-08-14 | Discharge: 2013-08-14 | Disposition: A | Discharge status: Home / Self Care | Payer: 59 | Source: Ambulatory Visit | Attending: Neurology | Admitting: Neurology

## 2013-08-14 ENCOUNTER — Encounter: Payer: Self-pay | Admitting: Family Medicine

## 2013-08-14 ENCOUNTER — Ambulatory Visit (INDEPENDENT_AMBULATORY_CARE_PROVIDER_SITE_OTHER): Payer: 59 | Admitting: Family Medicine

## 2013-08-14 VITALS — BP 138/90 | Ht 71.0 in | Wt 246.0 lb

## 2013-08-14 DIAGNOSIS — F329 Major depressive disorder, single episode, unspecified: Secondary | ICD-10-CM

## 2013-08-14 DIAGNOSIS — R413 Other amnesia: Secondary | ICD-10-CM | POA: Insufficient documentation

## 2013-08-14 DIAGNOSIS — J9600 Acute respiratory failure, unspecified whether with hypoxia or hypercapnia: Secondary | ICD-10-CM

## 2013-08-14 DIAGNOSIS — I10 Essential (primary) hypertension: Secondary | ICD-10-CM

## 2013-08-14 DIAGNOSIS — F32A Depression, unspecified: Secondary | ICD-10-CM

## 2013-08-14 DIAGNOSIS — F3289 Other specified depressive episodes: Secondary | ICD-10-CM

## 2013-08-14 DIAGNOSIS — G319 Degenerative disease of nervous system, unspecified: Secondary | ICD-10-CM | POA: Insufficient documentation

## 2013-08-14 DIAGNOSIS — R51 Headache: Secondary | ICD-10-CM | POA: Insufficient documentation

## 2013-08-14 MED ORDER — LISINOPRIL 5 MG PO TABS: 5.0000 mg | ORAL_TABLET | Freq: Every day | ORAL | Status: DC

## 2013-08-14 NOTE — Telephone Encounter (Signed)
Called patient and informed that he will be notified of the scheduled neuropsy. Testing, he verbalized understanding.

## 2013-08-14 NOTE — Patient Instructions (Addendum)
Be sure to follow up with dr Tobey Grim office on his plans to refer you onward for further psychological test

## 2013-08-14 NOTE — Telephone Encounter (Signed)
Pt called and stated that he spoke with Dr. Jannifer Franklin earlier regarding the MRI he had today.  During the discussion Dr. Jannifer Franklin stated that the next step is neuropsychological testing. Pt would like to know whether he needs to call them to set up the appointment or if they will call to set up the appointment.  Please call to discuss.  Thank you

## 2013-08-14 NOTE — Telephone Encounter (Signed)
I called patient. MRI the brain was relatively unremarkable, showing only mild atrophy. Neuropsychological testing is pending. The patient may be a candidate for use of Adderall.  MRI the brain 08/14/2013:  IMPRESSION:  Mild cerebral atrophy, otherwise unremarkable appearance of the  brain.

## 2013-08-14 NOTE — Progress Notes (Signed)
   Subjective:    Patient ID: Christopher Bowers, male    DOB: 03-10-1966, 48 y.o.   MRN: [id number]  HPIMed check up.   Had MRI today. Ordered by Dr. Jannifer Franklin for memory loss. Also had bloodwork done on 08/02/12.   Patient arrives office with numerous concerns. Blood pressure has been elevated. Was on medication for quite some time before hospitalization a year ago. Strong family history of high blood pressure. Next  Patient reports not exercising much these days. He is trying to watch salt intake.  Patient reports left mid foot pain. Comes and goes. First occurred after running a long distance last fall.  Currently in the midst of workup via Dr. Jannifer Franklin. See prior notes. Dr. Tobey Grim note reviewed.    Review of Systems No chest pain no headache no back pain no abdominal pain no change in bowel habits    Objective:   Physical Exam  Alert no acute distress talkative HEENT normal. Blood pressure repeat 136/92. Lungs clear. Heart regular rate and rhythm. Left foot some dorsal tenderness to deep palpation no plantar surface tenderness good pulses sensation      Assessment & Plan:  Impression 1 hypertension times a resume medicine discussed #2 metatarsalgia with pain-free days can hold off on x-ray at this time. Patient would prefer to do this. #3 mental status changes in the midst of workup. #4 question borderline thyroid results not available in our system. #5 depression patient states not ideal although not serious. Would rather not try any other medicines. Not suicidal. Plan initiate lisinopril 5 mg daily. Exercise encourage diet discussed. Other concerns discussed. Symptomatic care for foot discussed. Followup in 3 months. WSL

## 2013-08-20 ENCOUNTER — Other Ambulatory Visit: Payer: Self-pay | Admitting: Family Medicine

## 2013-08-22 ENCOUNTER — Other Ambulatory Visit: Payer: Self-pay | Admitting: *Deleted

## 2013-10-24 DIAGNOSIS — R413 Other amnesia: Secondary | ICD-10-CM

## 2013-10-25 ENCOUNTER — Other Ambulatory Visit: Payer: Self-pay | Admitting: *Deleted

## 2013-10-25 MED ORDER — ALBUTEROL SULFATE HFA 108 (90 BASE) MCG/ACT IN AERS: 2.0000 | INHALATION_SPRAY | Freq: Four times a day (QID) | RESPIRATORY_TRACT | Status: DC | PRN

## 2013-11-01 DIAGNOSIS — R413 Other amnesia: Secondary | ICD-10-CM

## 2013-11-06 ENCOUNTER — Telehealth: Payer: Self-pay | Admitting: Neurology

## 2013-11-06 NOTE — Telephone Encounter (Signed)
I called patient. The neuropsychological evaluation results were unremarkable, no evidence of cognitive dysfunction. The patient could have some issues with distractibility, would consider the possibility of a trial on Adderall. The patient will let you know if he wants to do this.

## 2013-11-14 ENCOUNTER — Encounter: Payer: Self-pay | Admitting: Family Medicine

## 2013-11-14 ENCOUNTER — Ambulatory Visit (INDEPENDENT_AMBULATORY_CARE_PROVIDER_SITE_OTHER): Payer: 59 | Admitting: Family Medicine

## 2013-11-14 VITALS — BP 138/84 | Ht 71.0 in | Wt 239.0 lb

## 2013-11-14 DIAGNOSIS — I10 Essential (primary) hypertension: Secondary | ICD-10-CM

## 2013-11-14 DIAGNOSIS — F3289 Other specified depressive episodes: Secondary | ICD-10-CM

## 2013-11-14 DIAGNOSIS — R413 Other amnesia: Secondary | ICD-10-CM

## 2013-11-14 DIAGNOSIS — F32A Depression, unspecified: Secondary | ICD-10-CM

## 2013-11-14 DIAGNOSIS — F329 Major depressive disorder, single episode, unspecified: Secondary | ICD-10-CM

## 2013-11-14 MED ORDER — LISINOPRIL 10 MG PO TABS: 10.0000 mg | ORAL_TABLET | Freq: Every day | ORAL | Status: DC

## 2013-11-14 MED ORDER — ESCITALOPRAM OXALATE 10 MG PO TABS: 10.0000 mg | ORAL_TABLET | Freq: Every day | ORAL | Status: DC

## 2013-11-14 NOTE — Progress Notes (Signed)
   Subjective:    Patient ID: Christopher Bowers, male    DOB: May 15, 1966, 48 y.o.   MRN: [id number]  HPI Patient is here today for a 3 month check up.  Pt has been going to the neurologist. He is seeing Dr. Jannifer Franklin. He has been 3 times already. He states he feels like he is getting better.   Pt has no new concerns/questions  Exercise overall better/ sporadic at best  So so with exercise, not on board  Watching diet closely  Patient expresses frustration that he is still having challenges. Review of records shows long-standing history of depression long before this event came along. See prior notes. Recently had life-threatening sepsis which led to element of neurocognitive dysfunction. Patient was advised by the neurocognitive folks that his tests were fairly normal. Patient states she definitely does not feel normal. Dr. Jannifer Franklin brought up the idea of separate ADHD medicine. Patient somewhat reluctant about this.  Review of Systems Occasional headaches no loss of consciousness no seizures no chest pain no shortness of breath no weight loss or weight gain ongoing significant forgetfulness    Objective:   Physical Exam  Alert HEENT normal neuro exam intact lungs clear. Heart regular rate and rhythm. Speech within normal limits.      Assessment & Plan:  Impression 1 neurocognitive compromise following life-threatening ARDS. #2 depression long standing with exacerbation after #1 plan long discussion held. Will add Lexapro 10 mg daily. Maintain Wellbutrin. Continue to followup with neurologist. Exercise strongly encourage. Followup as scheduled. Easily 25 minutes spent most in discussion. WSL

## 2013-12-03 ENCOUNTER — Other Ambulatory Visit: Payer: Self-pay | Admitting: *Deleted

## 2013-12-03 MED ORDER — BUPROPION HCL ER (SR) 200 MG PO TB12: 200.0000 mg | ORAL_TABLET | Freq: Two times a day (BID) | ORAL | Status: DC

## 2013-12-24 ENCOUNTER — Encounter: Payer: Self-pay | Admitting: Nurse Practitioner

## 2013-12-24 ENCOUNTER — Ambulatory Visit (INDEPENDENT_AMBULATORY_CARE_PROVIDER_SITE_OTHER): Payer: 59 | Admitting: Nurse Practitioner

## 2013-12-24 VITALS — BP 132/86 | Temp 98.4°F | Ht 71.0 in | Wt 235.0 lb

## 2013-12-24 DIAGNOSIS — J069 Acute upper respiratory infection, unspecified: Secondary | ICD-10-CM

## 2013-12-24 MED ORDER — LEVOFLOXACIN 500 MG PO TABS: 500.0000 mg | ORAL_TABLET | Freq: Every day | ORAL | Status: DC

## 2013-12-25 ENCOUNTER — Encounter: Payer: Self-pay | Admitting: Nurse Practitioner

## 2013-12-25 NOTE — Progress Notes (Signed)
Subjective:  Presents complaints of congestion and cough that began 5 days ago. No fever. Cough mainly at nighttime, better last night, slept well. Producing tan sputum. Possible slight wheeze at times, has used his inhaler x1 which helped. No sore throat or ear pain. No vomiting diarrhea or abdominal pain. Reflux disease stable on Nexium. PMH includes CAP with acute respiratory failure last year.  Objective:   BP 132/86  Temp(Src) 98.4 F (36.9 C) (Oral)  Ht 5\' 11"  (1.803 m)  Wt 235 lb (106.595 kg)  BMI 32.79 kg/m2 NAD. Alert, oriented. TMs clear effusion, no erythema. Pharynx mildly erythematous with PND noted. Neck supple with mild soft anterior adenopathy. Lungs clear. Heart regular rate rhythm. No tachypnea. Normal color.  Assessment: Acute upper respiratory infections of unspecified site  Plan:  Meds ordered this encounter  Medications  . levofloxacin (LEVAQUIN) 500 MG tablet    Sig: Take 1 tablet (500 mg total) by mouth daily.    Dispense:  10 tablet    Refill:  0    Order Specific Question:  Supervising Provider    Answer:  Mikey Kirschner [[id number]]   Continue Rhinocort. Add antihistamine as directed. OTC cough meds as directed. Call back if worsens or persists.

## 2014-02-18 ENCOUNTER — Other Ambulatory Visit: Payer: Self-pay | Admitting: Family Medicine

## 2014-02-20 ENCOUNTER — Ambulatory Visit: Payer: 59 | Admitting: Family Medicine

## 2014-02-25 ENCOUNTER — Other Ambulatory Visit: Payer: Self-pay | Admitting: *Deleted

## 2014-02-25 MED ORDER — BUDESONIDE 32 MCG/ACT NA SUSP: 2.0000 | Freq: Every day | NASAL | Status: DC

## 2014-03-04 ENCOUNTER — Other Ambulatory Visit: Payer: Self-pay | Admitting: Family Medicine

## 2014-06-10 ENCOUNTER — Other Ambulatory Visit: Payer: Self-pay | Admitting: Family Medicine

## 2014-06-28 ENCOUNTER — Other Ambulatory Visit: Payer: Self-pay | Admitting: Family Medicine

## 2014-07-12 ENCOUNTER — Ambulatory Visit (INDEPENDENT_AMBULATORY_CARE_PROVIDER_SITE_OTHER): Payer: 59 | Admitting: Family Medicine

## 2014-07-12 ENCOUNTER — Encounter: Payer: Self-pay | Admitting: Family Medicine

## 2014-07-12 VITALS — BP 122/80 | Temp 98.4°F | Ht 71.0 in | Wt 249.0 lb

## 2014-07-12 DIAGNOSIS — J310 Chronic rhinitis: Secondary | ICD-10-CM

## 2014-07-12 DIAGNOSIS — J209 Acute bronchitis, unspecified: Secondary | ICD-10-CM

## 2014-07-12 DIAGNOSIS — J329 Chronic sinusitis, unspecified: Secondary | ICD-10-CM

## 2014-07-12 MED ORDER — ALBUTEROL SULFATE HFA 108 (90 BASE) MCG/ACT IN AERS: 2.0000 | INHALATION_SPRAY | Freq: Four times a day (QID) | RESPIRATORY_TRACT | Status: DC | PRN

## 2014-07-12 MED ORDER — LEVOFLOXACIN 500 MG PO TABS: 500.0000 mg | ORAL_TABLET | Freq: Every day | ORAL | Status: AC

## 2014-07-12 NOTE — Progress Notes (Signed)
   Subjective:    Patient ID: Christopher Bowers, male    DOB: 07-25-1965, 49 y.o.   MRN: [id number]  Cough This is a new problem. The current episode started in the past 7 days. Associated symptoms include nasal congestion and wheezing. Treatments tried: mucinex, tylenol, advil.    Some exp to viral illness Cough bead at times  Hoarse  Non prod cough  Hacking and cong  Dim energy  Review of Systems  Respiratory: Positive for cough and wheezing.    no vomiting no diarrhea no rash     Objective:   Physical Exam  Alert hydration good. HEENT moderate nasal ingestion. Frontal in nature pharynx normal lungs bronchial cough heart regular in rhythm.      Assessment & Plan:  Impression rhinosinusitis/bronchitis plan antibiotics prescribed. Since Medicare discussed. Warning signs discussed. Patient understandably anxious with history of pneumonia and adult respiratory distress syndrome with life threatening, patient WSL

## 2014-07-22 ENCOUNTER — Other Ambulatory Visit: Payer: Self-pay | Admitting: Family Medicine

## 2014-07-31 ENCOUNTER — Ambulatory Visit (INDEPENDENT_AMBULATORY_CARE_PROVIDER_SITE_OTHER): Payer: 59 | Admitting: Family Medicine

## 2014-07-31 ENCOUNTER — Encounter: Payer: Self-pay | Admitting: Family Medicine

## 2014-07-31 VITALS — BP 120/76 | Ht 71.0 in | Wt 248.0 lb

## 2014-07-31 DIAGNOSIS — I10 Essential (primary) hypertension: Secondary | ICD-10-CM

## 2014-07-31 MED ORDER — LISINOPRIL 10 MG PO TABS: ORAL_TABLET | ORAL | Status: DC

## 2014-07-31 MED ORDER — BUPROPION HCL ER (SR) 200 MG PO TB12: ORAL_TABLET | ORAL | Status: DC

## 2014-07-31 MED ORDER — ESOMEPRAZOLE MAGNESIUM 20 MG PO CPDR: 20.0000 mg | DELAYED_RELEASE_CAPSULE | Freq: Every day | ORAL | Status: DC

## 2014-07-31 NOTE — Progress Notes (Signed)
   Subjective:    Patient ID: Christopher Bowers, male    DOB: 1966/03/19, 49 y.o.   MRN: [id number]  Hypertension This is a chronic problem. The current episode started more than 1 year ago. Risk factors for coronary artery disease include male gender. Treatments tried: lisinopril.   Patient has upcoming appt with ortho about carpel tunnel syndrome.  Slightly tight and wheezy schedule  Not exercising regulaly  BP has cuff, does not ck,  Takes med regulaly compliant with meds exercise off-and-on  Has stopped the lexapro, a bit mor e anxious, lexapro had caused diminshed mood s and pt did not like  Memory dys function persists but better, patient has adapted to it. Keeps lists. States overall better compared to last year.  Heartburn. Rare at this time      Review of Systems No headache no chest pain no back pain no change in bowel habits no blood in stool    Objective:   Physical Exam  Alert vitals stable blood pressure good on repeat HEENT normal lungs clear heart regular in rhythm. Ankles without edema      Assessment & Plan:  Impression 1 hypertension good control #2 reactive airways improved #3 short-term memory associated with brain damage clinically improved plan maintain same medications. Diet exercise discussed. Recheck in 6 months. WSL

## 2014-08-05 ENCOUNTER — Ambulatory Visit: Payer: 59 | Admitting: Family Medicine

## 2014-08-05 ENCOUNTER — Ambulatory Visit: Payer: 59 | Admitting: Neurology

## 2014-09-04 ENCOUNTER — Other Ambulatory Visit: Payer: Self-pay | Admitting: Family Medicine

## 2014-09-26 ENCOUNTER — Emergency Department (HOSPITAL_COMMUNITY)
Admission: EM | Admit: 2014-09-26 | Discharge: 2014-09-26 | Disposition: A | Discharge status: Home / Self Care | Payer: 59 | Attending: Emergency Medicine | Admitting: Emergency Medicine

## 2014-09-26 ENCOUNTER — Emergency Department (HOSPITAL_COMMUNITY): Payer: 59

## 2014-09-26 ENCOUNTER — Encounter (HOSPITAL_COMMUNITY): Payer: Self-pay

## 2014-09-26 DIAGNOSIS — Z880 Allergy status to penicillin: Secondary | ICD-10-CM | POA: Insufficient documentation

## 2014-09-26 DIAGNOSIS — I10 Essential (primary) hypertension: Secondary | ICD-10-CM | POA: Insufficient documentation

## 2014-09-26 DIAGNOSIS — Z7951 Long term (current) use of inhaled steroids: Secondary | ICD-10-CM | POA: Diagnosis not present

## 2014-09-26 DIAGNOSIS — K219 Gastro-esophageal reflux disease without esophagitis: Secondary | ICD-10-CM | POA: Diagnosis not present

## 2014-09-26 DIAGNOSIS — Z8669 Personal history of other diseases of the nervous system and sense organs: Secondary | ICD-10-CM | POA: Insufficient documentation

## 2014-09-26 DIAGNOSIS — J209 Acute bronchitis, unspecified: Secondary | ICD-10-CM | POA: Diagnosis not present

## 2014-09-26 DIAGNOSIS — R0602 Shortness of breath: Secondary | ICD-10-CM | POA: Diagnosis present

## 2014-09-26 DIAGNOSIS — Z79899 Other long term (current) drug therapy: Secondary | ICD-10-CM | POA: Diagnosis not present

## 2014-09-26 DIAGNOSIS — J40 Bronchitis, not specified as acute or chronic: Secondary | ICD-10-CM

## 2014-09-26 LAB — BRAIN NATRIURETIC PEPTIDE: B Natriuretic Peptide: 17 pg/mL (ref 0.0–100.0)

## 2014-09-26 LAB — CBC WITH DIFFERENTIAL/PLATELET
Basophils Absolute: 0.1 10*3/uL (ref 0.0–0.1)
Basophils Relative: 1 % (ref 0–1)
Eosinophils Absolute: 0.5 10*3/uL (ref 0.0–0.7)
Eosinophils Relative: 6 % — ABNORMAL HIGH (ref 0–5)
HCT: 44.9 % (ref 39.0–52.0)
Hemoglobin: 14.9 g/dL (ref 13.0–17.0)
Lymphocytes Relative: 26 % (ref 12–46)
Lymphs Abs: 1.9 10*3/uL (ref 0.7–4.0)
MCH: 31 pg (ref 26.0–34.0)
MCHC: 33.2 g/dL (ref 30.0–36.0)
MCV: 93.3 fL (ref 78.0–100.0)
Monocytes Absolute: 0.9 10*3/uL (ref 0.1–1.0)
Monocytes Relative: 12 % (ref 3–12)
Neutro Abs: 4 10*3/uL (ref 1.7–7.7)
Neutrophils Relative %: 55 % (ref 43–77)
Platelets: 190 10*3/uL (ref 150–400)
RBC: 4.81 MIL/uL (ref 4.22–5.81)
RDW: 12.8 % (ref 11.5–15.5)
WBC: 7.3 10*3/uL (ref 4.0–10.5)

## 2014-09-26 LAB — BASIC METABOLIC PANEL
Anion gap: 9 (ref 5–15)
BUN: 19 mg/dL (ref 6–23)
CO2: 24 mmol/L (ref 19–32)
Calcium: 9.1 mg/dL (ref 8.4–10.5)
Chloride: 106 mmol/L (ref 96–112)
Creatinine, Ser: 1.18 mg/dL (ref 0.50–1.35)
GFR calc Af Amer: 82 mL/min — ABNORMAL LOW (ref >90)
GFR calc non Af Amer: 71 mL/min — ABNORMAL LOW (ref >90)
Glucose, Bld: 106 mg/dL — ABNORMAL HIGH (ref 70–99)
Potassium: 5 mmol/L (ref 3.5–5.1)
Sodium: 139 mmol/L (ref 135–145)

## 2014-09-26 MED ORDER — PREDNISONE 50 MG PO TABS: 50.0000 mg | ORAL_TABLET | Freq: Every day | ORAL | Status: DC

## 2014-09-26 MED ORDER — ALBUTEROL SULFATE HFA 108 (90 BASE) MCG/ACT IN AERS
2.0000 | INHALATION_SPRAY | Freq: Once | RESPIRATORY_TRACT | Status: AC
  Administered 2014-09-26: 2 via RESPIRATORY_TRACT
  Filled 2014-09-26: qty 6.7

## 2014-09-26 MED ORDER — GUAIFENESIN ER 1200 MG PO TB12: 1.0000 | ORAL_TABLET | Freq: Two times a day (BID) | ORAL | Status: DC

## 2014-09-26 MED ORDER — LEVOFLOXACIN 750 MG PO TABS: 750.0000 mg | ORAL_TABLET | Freq: Every day | ORAL | Status: DC

## 2014-09-26 MED ORDER — ALBUTEROL (5 MG/ML) CONTINUOUS INHALATION SOLN
10.0000 mg/h | INHALATION_SOLUTION | RESPIRATORY_TRACT | Status: AC
  Administered 2014-09-26: 10 mg/h via RESPIRATORY_TRACT
  Filled 2014-09-26: qty 20

## 2014-09-26 MED ORDER — ALBUTEROL SULFATE (2.5 MG/3ML) 0.083% IN NEBU
5.0000 mg | INHALATION_SOLUTION | Freq: Once | RESPIRATORY_TRACT | Status: AC
  Administered 2014-09-26: 5 mg via RESPIRATORY_TRACT
  Filled 2014-09-26: qty 6

## 2014-09-26 MED ORDER — METHYLPREDNISOLONE SODIUM SUCC 125 MG IJ SOLR
125.0000 mg | INTRAMUSCULAR | Status: AC
  Administered 2014-09-26: 125 mg via INTRAVENOUS
  Filled 2014-09-26: qty 2

## 2014-09-26 MED ORDER — IPRATROPIUM BROMIDE 0.02 % IN SOLN
0.5000 mg | RESPIRATORY_TRACT | Status: AC
Start: 2014-09-26 — End: 2014-09-26
  Administered 2014-09-26: 0.5 mg via RESPIRATORY_TRACT
  Filled 2014-09-26: qty 2.5

## 2014-09-26 NOTE — Discharge Instructions (Signed)
Follow-up with your pulmonary doctor and primary care doctor.  Return here as needed for any worsening in your condition

## 2014-09-26 NOTE — ED Notes (Signed)
Pt reports having shortness of breath for several days with increased trouble sleeping for approximately 3 days due to being unable to lay flat. He has also had increased wheezing. Pt has history of intubation/ARDS after having the flu approximately 2 years ago.

## 2014-09-26 NOTE — ED Notes (Signed)
Pt walked around the unit. Pt remained above 92% SPO2 the entire time. HR below 90 at all times with no distress.

## 2014-09-26 NOTE — ED Notes (Signed)
Pt wife reporting he pt has left shoulder pain with tingling in left arm. Second EKG captured and shown to MD. EKG is normal. EDP aware of pain.

## 2014-09-26 NOTE — ED Provider Notes (Signed)
CSN: [id number]     Arrival date & time 09/26/14  0707 History   First MD Initiated Contact with Patient 09/26/14 [phone]     Chief Complaint  Patient presents with  . Shortness of Breath     (Consider location/radiation/quality/duration/timing/severity/associated sxs/prior Treatment) HPI   Mr. Skorupski is a 49 yo male with OSA and HTN who presents to the ED with wheezing and SOB x 2 days. He states that he has been wheezing more for the past three weeks. He came in today because for the last 2 nights he has been unable to sleep much due to shortness of breath and wheezing when he lays down. He has been using his inhaler and says it helps some. Before the last few nights, he denies orthopnea. He also complains of a cough with clear mucus production. Has DOE only with heavy activity. He denies fever, chills, fatigue, unexplained weight loss, weakness, headache, dizziness, syncope, chest pain, swelling, abdominal pain, nausea, vomiting, diarrhea, constipation or urinary symptoms.   Two years ago pt says he had the flu and had to be intubated/developed ARDS. Since then, has had more URIs and lung issues. His wife states he has an appointment in mid-May at [facility] to address "lung remodeling issues" after ARDS. He denies history of asthma. Does endorse year-round allergies, worse over the last few weeks with change of seasons, which he uses rhinocort for.   Past Medical History  Diagnosis Date  . Hypertension   . OSA (obstructive sleep apnea)   . Acid reflux   . Gastritis, bile acid reflux   . Memory change 08/02/2013   Past Surgical History  Procedure Laterality Date  . Tonsillectomy    . Refractive surgery Bilateral    Family History  Problem Relation Age of Onset  . Diabetes Father   . Heart disease Father   . Diabetes Son    History  Substance Use Topics  . Smoking status: Never Smoker   . Smokeless tobacco: Never Used  . Alcohol Use: Yes     Comment: moderate    Review of Systems All  other systems negative except as documented in the HPI. All pertinent positives and negatives as reviewed in the HPI.    Allergies  Augmentin; Flonase; and Amoxicillin-pot clavulanate  Home Medications   Prior to Admission medications   Medication Sig Start Date End Date Taking? Authorizing Provider  albuterol (PROVENTIL HFA;VENTOLIN HFA) 108 (90 BASE) MCG/ACT inhaler Inhale 2 puffs into the lungs every 6 (six) hours as needed for wheezing. 10/25/13   Mikey Kirschner, MD  albuterol (PROVENTIL HFA;VENTOLIN HFA) 108 (90 BASE) MCG/ACT inhaler Inhale 2 puffs into the lungs every 6 (six) hours as needed for wheezing or shortness of breath. 07/12/14   Mikey Kirschner, MD  budesonide (RHINOCORT AQUA) 32 MCG/ACT nasal spray Place 2 sprays into both nostrils daily. 02/25/14   Mikey Kirschner, MD  buPROPion (WELLBUTRIN SR) 200 MG 12 hr tablet TAKE 1 TABLET BY MOUTH 2 TIMES DAILY. 07/31/14   Mikey Kirschner, MD  buPROPion (WELLBUTRIN SR) 200 MG 12 hr tablet TAKE 1 TABLET BY MOUTH 2 TIMES DAILY. 09/05/14   Kathyrn Drown, MD  esomeprazole (NEXIUM) 20 MG capsule Take 1 capsule (20 mg total) by mouth daily at 12 noon. 07/31/14   Mikey Kirschner, MD  lisinopril (PRINIVIL,ZESTRIL) 10 MG tablet TAKE 1 TABLET BY MOUTH ONCE DAILY 07/31/14   Mikey Kirschner, MD   BP 125/91 mmHg  Pulse 66  Temp(Src) 98.1 F (36.7 C) (Oral)  Resp 13  Ht 5\' 11"  (1.803 m)  Wt 240 lb (108.863 kg)  BMI 33.49 kg/m2  SpO2 97% Physical Exam  Constitutional: He is oriented to person, place, and time. He appears well-developed and well-nourished.  HENT:  Head: Normocephalic and atraumatic.  Mouth/Throat: Oropharynx is clear and moist and mucous membranes are normal.  Eyes: Conjunctivae are normal. Pupils are equal, round, and reactive to light.  Neck: Normal range of motion. Neck supple. No thyromegaly present.  Cardiovascular: Normal rate, regular rhythm, S1 normal, S2 normal and normal heart sounds.  Exam reveals no gallop and no  friction rub.   No murmur heard. Pulmonary/Chest: Effort normal and breath sounds normal. No respiratory distress.  Expiratory wheezes throughout lungs.  Abdominal: Soft. Normal appearance and bowel sounds are normal. There is no tenderness. There is no guarding.  Musculoskeletal: He exhibits no edema.  Neurological: He is alert and oriented to person, place, and time. He exhibits normal muscle tone. Coordination normal.  Skin: Skin is warm and dry. No rash noted. No erythema.  Psychiatric: He has a normal mood and affect. His behavior is normal.  Nursing note and vitals reviewed.   ED Course  Procedures (including critical care time) Labs Review Labs Reviewed  BASIC METABOLIC PANEL  CBC WITH DIFFERENTIAL/PLATELET  BRAIN NATRIURETIC PEPTIDE    Imaging Review No results found.   EKG Interpretation   Date/Time:  Thursday September 26 2014 07:16:47 EDT Ventricular Rate:  67 PR Interval:  138 QRS Duration: 86 QT Interval:  412 QTC Calculation: 435 R Axis:   35 Text Interpretation:  Sinus rhythm Confirmed by Hazle Coca [phone]) on  09/26/2014 7:24:42 AM     Patient observed for many hours here in the emergency department, given an hour-long treatment and another second, regular, no treatment with steroids.  Patient is feeling some better.  His wheezing is completely resolved this time.  The patient is advised follow-up with his primary care Dr. told to return here as needed.  The patient was ambulated without difficulty and maintaining normal oxygen saturations.  I have advised the patient that if he has any worsening symptoms to return here.  I feel that this is not likely to be pulmonary embolus.   Dalia Heading, PA-C 09/27/14 [place], MD 09/27/14 [phone]

## 2014-10-10 ENCOUNTER — Ambulatory Visit (INDEPENDENT_AMBULATORY_CARE_PROVIDER_SITE_OTHER): Payer: 59 | Admitting: Internal Medicine

## 2014-10-10 ENCOUNTER — Encounter: Payer: Self-pay | Admitting: Internal Medicine

## 2014-10-10 VITALS — BP 122/76 | HR 83 | Ht 71.0 in | Wt 247.0 lb

## 2014-10-10 DIAGNOSIS — R058 Other specified cough: Secondary | ICD-10-CM

## 2014-10-10 DIAGNOSIS — I10 Essential (primary) hypertension: Secondary | ICD-10-CM | POA: Diagnosis not present

## 2014-10-10 DIAGNOSIS — R05 Cough: Secondary | ICD-10-CM | POA: Diagnosis not present

## 2014-10-10 MED ORDER — VALSARTAN 160 MG PO TABS: 160.0000 mg | ORAL_TABLET | Freq: Every day | ORAL | Status: DC

## 2014-10-10 NOTE — Progress Notes (Signed)
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   History of Present Illness  04/17/2013 1st [facility] office visit/ Christopher Bowers cc persistent cough with mucus turning lighter since onset 04/09/13 and fever gone. Cough is worst at hs and in am, no sob, rigors, arthralgias or pleuritic cp. Still  Feels wheezy with nasal and throat congestion and using lots of otcs' including allegra D with elevations in BP noted.  Was very anxious re dx of ards that history was not repeating itself so self referred back to pulmonary  rec Stop the allegra D and all decongestants If still congested /wheezing > Prednisone 10 mg take  4 each am x 2 days,   2 each am x 2 days,  1 each am x 2 days and stop  In the event of any respiratory flare, take Nexium 40 mg Take 30- 60 min before your first and last meals of the day until better for a week GERD diet >>> all coughing resolved/ no sob         10/10/2014 Acute extened  ov/Christopher Bowers re: recurrent wheeze/ sob on ACEi   Chief Complaint  Patient presents with  . Follow-up    Pt c/o wheezing and SOB on and off for the past few months.   around 07/01/2014 cough/ wheezing / sob recurred  and restarted nexium but no benefit and gradually worse even with saba and went to ER 4/281/6 and given even more saba but no steroids.  Cough is dry and day > noct with wheezing heard across the room   No obvious day to day or daytime variabilty or assoc  cp or chest tightness,  overt sinus or hb symptoms. No unusual exp hx or h/o childhood pna/ asthma or knowledge  of premature birth.  Sleeping ok without nocturnal  or early am exacerbation  of respiratory  c/o's or need for noct saba. Also denies any obvious fluctuation of symptoms with weather or environmental changes or other aggravating or alleviating factors except as outlined above   Current Medications, Allergies, Complete Past Medical History, Past Surgical History, Family History, and Social History were reviewed in [employer] record.  ROS  The following are not active complaints unless bolded sore throat, dysphagia, dental problems, itching, sneezing,  nasal congestion or excess/ purulent secretions, ear ache,   fever, chills, sweats, unintended wt loss, pleuritic or exertional cp, hemoptysis,  orthopnea pnd or leg swelling, presyncope, palpitations, heartburn, abdominal pain, anorexia, nausea, vomiting, diarrhea  or change in bowel or urinary habits, change in stools or urine, dysuria,hematuria,  rash, arthralgias, visual complaints, headache, numbness weakness or ataxia or problems with walking or coordination,  change in mood/affect or memory.                    Objective:   Physical Exam  10/10/2014       247  Wt Readings from Last 3 Encounters:  04/17/13 244 lb (110.678 kg)  04/11/13 245 lb (111.131 kg)  12/07/12 243 lb (110.224  kg)       HEENT: nl dentition, turbinates, and orophanx. Nl external ear canals without cough reflex   NECK :  without JVD/Nodes/TM/ nl carotid upstrokes bilaterally   LUNGS: no acc muscle use, clear to A and P bilaterally without cough on insp or exp maneuvers   CV:  RRR  no s3 or murmur or increase in P2, no edema   ABD:  soft and nontender with nl excursion in the supine position. No bruits or organomegaly, bowel sounds nl  MS:  warm without deformities, calf tenderness, cyanosis or clubbing  SKIN: warm and dry without lesions    NEURO:  alert, approp, no deficits      I personally reviewed images and agree with  radiology impression as follows:  CXR:  09/16/14 The lungs are clear. Heart size is normal. No pneumothorax or pleural effusion. No focal bony abnormality.     Assessment & Plan:

## 2014-10-10 NOTE — Patient Instructions (Addendum)
Continue nexium Take 30- 60 min before your first and last meals of the day until 100% better then ok to taper off   GERD (REFLUX)  is an extremely common cause of respiratory symptoms just like yours , many times with no obvious heartburn at all.    It can be treated with medication, but also with lifestyle changes including avoidance of late meals, excessive alcohol, smoking cessation, and avoid fatty foods, chocolate, peppermint, colas, red wine, and acidic juices such as orange juice.  NO MINT OR MENTHOL PRODUCTS SO NO COUGH DROPS  USE SUGARLESS CANDY INSTEAD (Jolley ranchers or Stover's or Life Savers) or even ice chips will also do - the key is to swallow to prevent all throat clearing. NO OIL BASED VITAMINS - use powdered substitutes.   Stop lisinopril and start valsartan 80 mg daily    If you are satisfied with your treatment plan,  let your doctor know and he/she can either refill your medications or you can return here when your prescription runs out.     If in any way you are not 100% satisfied,  please tell us.  If 100% better, tell your friends!  Pulmonary follow up is as needed

## 2014-10-11 ENCOUNTER — Encounter: Payer: Self-pay | Admitting: Internal Medicine

## 2014-10-11 NOTE — Assessment & Plan Note (Signed)
ACE inhibitors are problematic in  pts with airway complaints because  even experienced pulmonologists can't always distinguish ace effects from copd/asthma/pnds/ allergies etc.  By themselves they don't actually cause a problem, much like oxygen can't by itself start a fire, but they certainly serve as a powerful catalyst or enhancer for any "fire"  or inflammatory process in the upper airway, be it caused by an ET  tube or more commonly reflux (especially in the obese or pts with known GERD or who are on biphoshonates) or URI's, due to interference with bradykinin clearance.  The effects of acei on bradykinin levels occurs in 100% of pt's on acei (unless they surreptitiously stop the med!) but the classic cough is only reported in 5%.  This leaves 95% of pts on acei's  with a variety of syndromes including no identifiable symptom in most  vs non-specific symptoms that wax and wane depending on what other insult is occuring at the level of the upper airway (likely initially viral uri, then cyclical cough/ gerd)  rec trial off acei x 6 weeks, this is the only way to sort it out  Valsartan 80 mg daily and f/u primary care if better, here if not

## 2014-10-11 NOTE — Assessment & Plan Note (Addendum)
Recurrent cough /wheeze that previously responded to gerd rx (was not really sure about this as his hx was extremely challenging in terms of establish a firm baseline prior to onset of recurrent symptoms in feb 2016) and now is not responding as before  but now he's on ACEi   Classic Upper airway cough syndrome, so named because it's frequently impossible to sort out how much is  CR/sinusitis with freq throat clearing (which can be related to primary GERD)   vs  causing  secondary (" extra esophageal")  GERD from wide swings in gastric pressure that occur with throat clearing, often  promoting self use of mint and menthol lozenges that reduce the lower esophageal sphincter tone and exacerbate the problem further in a cyclical fashion.   These are the same pts (now being labeled as having "irritable larynx syndrome" by some cough centers) who not infrequently have a history of having failed to tolerate ace inhibitors,  dry powder inhalers or biphosphonates or report having atypical reflux symptoms that don't respond to standard doses of PPI , and are easily confused as having aecopd or asthma flares by even experienced allergists/ pulmonologists.    I had an extended discussion with the patient and wife  reviewing all relevant studies completed to date and  lasting 42minutes of a 40 minute visit on the following ongoing concerns:   First step is try off acei (see hbp) while maintaining max rx for gerd.  Each maintenance medication was reviewed in detail including most importantly the difference between maintenance and as needed and under what circumstances the prns are to be used.  Please see instructions for details which were reviewed in writing and the patient given a copy.

## 2014-10-25 ENCOUNTER — Encounter: Payer: Self-pay | Admitting: Adult Health

## 2014-10-25 ENCOUNTER — Ambulatory Visit (INDEPENDENT_AMBULATORY_CARE_PROVIDER_SITE_OTHER): Payer: 59 | Admitting: Adult Health

## 2014-10-25 VITALS — BP 110/82 | HR 78 | Temp 98.2°F | Ht 71.0 in | Wt 245.0 lb

## 2014-10-25 DIAGNOSIS — I10 Essential (primary) hypertension: Secondary | ICD-10-CM

## 2014-10-25 DIAGNOSIS — R05 Cough: Secondary | ICD-10-CM

## 2014-10-25 DIAGNOSIS — R058 Other specified cough: Secondary | ICD-10-CM

## 2014-10-25 MED ORDER — PREDNISONE 10 MG PO TABS: ORAL_TABLET | ORAL | Status: DC

## 2014-10-25 NOTE — Assessment & Plan Note (Signed)
Controlled on Diovan Avoid ACE inhibitor's if possible

## 2014-10-25 NOTE — Patient Instructions (Signed)
Prednisone taper over next week.  Delsym 2 tsp Twice daily  For cough As needed   Begin Allegra 180mg  daily in am  Begin Chlortrimeton 4mg  2 At bedtime   Sips of water to avoid cough and throat clearing  NO MINTS.  Follow up Dr. Melvyn Novas  In 4 weeks and As needed   Please contact office for sooner follow up if symptoms do not improve or worsen or seek emergency care

## 2014-10-25 NOTE — Progress Notes (Signed)
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   History of Present Illness  04/17/2013 1st [facility] office visit/ Wert cc persistent cough with mucus turning lighter since onset 04/09/13 and fever gone. Cough is worst at hs and in am, no sob, rigors, arthralgias or pleuritic cp. Still  Feels wheezy with nasal and throat congestion and using lots of otcs' including allegra D with elevations in BP noted.  Was very anxious re dx of ards that history was not repeating itself so self referred back to pulmonary  rec Stop the allegra D and all decongestants If still congested /wheezing > Prednisone 10 mg take  4 each am x 2 days,   2 each am x 2 days,  1 each am x 2 days and stop  In the event of any respiratory flare, take Nexium 40 mg Take 30- 60 min before your first and last meals of the day until better for a week GERD diet >>> all coughing resolved/ no sob         10/10/2014 Acute extened  ov/[place] re: recurrent wheeze/ sob on ACEi   Chief Complaint  Patient presents with  . Follow-up    Pt c/o wheezing and SOB on and off for the past few months.   around 07/01/2014 cough/ wheezing / sob recurred  and restarted nexium but no benefit and gradually worse even with saba and went to ER 4/281/6 and given even more saba but no steroids.  Cough is dry and day > noct with wheezing heard across the room  >>changed ACE to diovan   10/25/2014 Acute OV : Cough  Patient says for an acute office visit Patient complains of cough, wheezing for the last several months. Is  been treated with several anabiotic cyst since January of this year. He was seen 2 weeks ago with Dr. Melvyn Novas, taken off of lisinopril and change to Diovan due to suspected ace induced cough. Patient says that he continues to have some cough and wheezing that he can hear coming from his throat. He has frequent throat clearing. And feels that he has some postnasal drainage. No dyspnea. Denies chest pain, orthopnea, PND , hemoptysis or leg swelling. Patient did have a critical illness in 2014 with H1 N1, influenza and ARDS. He does report some post critical care issues with short-term memory loss, anxiety, but improved with counseling. Repeat chest x-rays have showed clear lungs. Patient had PFTs in 2014 that he reports is normal -records were requested . Patient says he has been on an ACE inhibitor for years. Ace for years.  He denies any hoarseness or voice changes.   Current Medications, Allergies, Complete Past Medical History, Past Surgical History, Family History, and Social History were reviewed in [employer] record.  ROS  The following are not active complaints unless bolded sore throat, dysphagia, dental problems, itching, sneezing,  nasal congestion or excess/ purulent secretions, ear ache,   fever, chills, sweats, unintended wt loss, pleuritic or exertional cp, hemoptysis,  orthopnea pnd or  leg swelling, presyncope, palpitations, heartburn, abdominal pain, anorexia, nausea, vomiting, diarrhea  or change in bowel or urinary habits, change in stools or urine, dysuria,hematuria,  rash, arthralgias, visual complaints, headache, numbness weakness or ataxia or problems with walking or coordination,  change in mood/affect or memory.                    Objective:   Physical Exam  10/10/2014       247>245 10/25/2014     HEENT: nl dentition, turbinates, and orophanx. Nl external ear canals without cough reflex   NECK :  without JVD/Nodes/TM/ nl carotid upstrokes  bilaterally   LUNGS: no acc muscle use, clear to A and P bilaterally without cough on insp or exp maneuvers psuedowheeze on forced exp    CV:  RRR  no s3 or murmur or increase in P2, no edema   ABD:  soft and nontender with nl excursion in the supine position. No bruits or organomegaly, bowel sounds nl  MS:  warm without deformities, calf tenderness, cyanosis or clubbing  SKIN: warm and dry without lesions    NEURO:  alert, approp, no deficits      I   CXR:  09/16/14 The lungs are clear. Heart size is normal. No pneumothorax or pleural effusion. No focal bony abnormality.     Assessment & Plan:

## 2014-10-25 NOTE — Assessment & Plan Note (Signed)
Recurrent bronchitis with upper airway cough syndrome Would remain off of ACE inhibitor as may aggravate his ongoing cough. Continue with treatment for prevention of cough. Triggers with reflux and rhinitis. Add anti-histamine. If cough does not improve or persist. Can consider CT sinus. Along with an ENT referral Treat with short-term prednisone  Plan  Prednisone taper over next week.  Delsym 2 tsp Twice daily  For cough As needed   Begin Allegra 180mg  daily in am  Begin Chlortrimeton 4mg  2 At bedtime   Sips of water to avoid cough and throat clearing  NO MINTS.  Follow up Dr. Melvyn Novas  In 4 weeks and As needed   Please contact office for sooner follow up if symptoms do not improve or worsen or seek emergency care

## 2014-10-25 NOTE — Progress Notes (Signed)
Chart and office note reviewed in detail along with available xrays/ labs > agree with a/p as outlined - could not find his pft in [place] so needs to be repeated if not available directly from pft lab

## 2015-01-16 ENCOUNTER — Other Ambulatory Visit: Payer: Self-pay | Admitting: Family Medicine

## 2015-01-20 ENCOUNTER — Ambulatory Visit (INDEPENDENT_AMBULATORY_CARE_PROVIDER_SITE_OTHER): Payer: 59 | Admitting: Neurology

## 2015-01-20 ENCOUNTER — Encounter: Payer: Self-pay | Admitting: Neurology

## 2015-01-20 VITALS — BP 140/89 | HR 75 | Ht 71.0 in | Wt 250.0 lb

## 2015-01-20 DIAGNOSIS — R413 Other amnesia: Secondary | ICD-10-CM | POA: Diagnosis not present

## 2015-01-20 NOTE — Progress Notes (Signed)
Reason for visit: Memory disorder  Christopher Bowers is an 49 y.o. male  History of present illness:  Christopher Bowers is a 49 year old right-handed white male with a history of difficulty with cognitive processing. The patient indicates that he has done very well since last seen. The patient underwent formal neuropsychological evaluation that did not show a true dementing illness, more of issues with focusing. The patient does have a long-standing history of depression, he has been on Wellbutrin for number of years, he feels that his mood has been roughly stable. The patient has begun taking notes at work for several weeks, and he believes that this process has helped his memory, he has not had to take notes as much recently. Overall, he functions well at work and at home. He will occasionally be forgetful, but this is not a significant issue for him. Overall, he feels much better regarding his memory. He has not gotten back into exercise, he believes that he is sleeping well at night with good energy during the day. He denies any other significant medical issues that have come up since last seen.  Past Medical History  Diagnosis Date  . Hypertension   . OSA (obstructive sleep apnea)   . Acid reflux   . Gastritis, bile acid reflux   . Memory change 08/02/2013    Past Surgical History  Procedure Laterality Date  . Tonsillectomy    . Refractive surgery Bilateral     Family History  Problem Relation Age of Onset  . Diabetes Father   . Heart disease Father   . Diabetes Son     Social history:  reports that he has never smoked. He uses smokeless tobacco. He reports that he drinks about 8.4 oz of alcohol per week. He reports that he does not use illicit drugs.    Allergies  Allergen Reactions  . Augmentin [Amoxicillin-Pot Clavulanate]   . Flonase [Fluticasone Propionate]   . Amoxicillin-Pot Clavulanate Rash    Medications:  Prior to Admission medications   Medication Sig Start Date End  Date Taking? Authorizing Provider  albuterol (PROVENTIL HFA;VENTOLIN HFA) 108 (90 BASE) MCG/ACT inhaler Inhale 2 puffs into the lungs every 6 (six) hours as needed for wheezing. 10/25/13  Yes Mikey Kirschner, MD  budesonide (RHINOCORT AQUA) 32 MCG/ACT nasal spray Place 2 sprays into both nostrils daily. 02/25/14  Yes Mikey Kirschner, MD  buPROPion (WELLBUTRIN SR) 200 MG 12 hr tablet TAKE 1 TABLET BY MOUTH 2 TIMES DAILY. 07/31/14  Yes Mikey Kirschner, MD  Cholecalciferol (VITAMIN D3) 5000 UNITS TABS Take 5,000 Units by mouth daily.   Yes Historical Provider, MD  esomeprazole (NEXIUM) 40 MG capsule TAKE 1 CAPSULE BY MOUTH 2 TIMES DAILY BEFORE A MEAL. 01/16/15  Yes Mikey Kirschner, MD  valsartan (DIOVAN) 160 MG tablet Take 1 tablet (160 mg total) by mouth daily. 10/10/14  Yes Tanda Rockers, MD    ROS:  Out of a complete 14 system review of symptoms, the patient complains only of the following symptoms, and all other reviewed systems are negative.  Wheezing Environmental allergies  Blood pressure 140/89, pulse 75, height 5\' 11"  (1.803 m), weight 250 lb (113.399 kg).  Physical Exam  General: The patient is alert and cooperative at the time of the examination. The patient is moderately obese.  Skin: No significant peripheral edema is noted.   Neurologic Exam  Mental status: The patient is alert and oriented x 3 at the time of the  examination. The patient has apparent normal recent and remote memory, with an apparently normal attention span and concentration ability.   Cranial nerves: Facial symmetry is present. Speech is normal, no aphasia or dysarthria is noted. Extraocular movements are full. Visual fields are full.  Motor: The patient has good strength in all 4 extremities.  Sensory examination: Soft touch sensation is symmetric on the face, arms, and legs.  Coordination: The patient has good finger-nose-finger and heel-to-shin bilaterally.  Gait and station: The patient has a  normal gait. Tandem gait is normal. Romberg is negative. No drift is seen.  Reflexes: Deep tendon reflexes are symmetric.   Assessment/Plan:  1. Mild memory disturbance  The patient is doing relatively well at this time. He denies any significant cognitive issues. The difficulty focusing has greatly improved. He will follow-up through this office if needed.  Jill Alexanders MD 01/20/2015 8:30 AM  [facility] [address] [place], [place] [address]  Phone [phone] Fax [phone]

## 2015-02-06 ENCOUNTER — Telehealth: Payer: Self-pay | Admitting: Family Medicine

## 2015-02-06 DIAGNOSIS — I10 Essential (primary) hypertension: Secondary | ICD-10-CM

## 2015-02-06 DIAGNOSIS — Z131 Encounter for screening for diabetes mellitus: Secondary | ICD-10-CM

## 2015-02-06 DIAGNOSIS — Z79899 Other long term (current) drug therapy: Secondary | ICD-10-CM

## 2015-02-06 NOTE — Telephone Encounter (Addendum)
Calling to request order for blood work.  He had labwork done on 09/26/2014 at the hospital.  He wants to know if these labs would be sufficient or should he re-do?

## 2015-02-07 NOTE — Telephone Encounter (Signed)
Patient notified bloodwork was ordered.  

## 2015-02-07 NOTE — Telephone Encounter (Signed)
Lip liv glu 

## 2015-02-11 LAB — LIPID PANEL
CHOL/HDL RATIO: 4.5 ratio (ref 0.0–5.0)
Cholesterol, Total: 200 mg/dL — ABNORMAL HIGH (ref 100–199)
HDL: 44 mg/dL (ref >39)
LDL CALC: 130 mg/dL — AB (ref 0–99)
TRIGLYCERIDES: 130 mg/dL (ref 0–149)
VLDL Cholesterol Cal: 26 mg/dL (ref 5–40)

## 2015-02-11 LAB — GLUCOSE, RANDOM: Glucose: 105 mg/dL — ABNORMAL HIGH (ref 65–99)

## 2015-02-11 LAB — HEPATIC FUNCTION PANEL
ALBUMIN: 4.9 g/dL (ref 3.5–5.5)
ALT: 67 IU/L — AB (ref 0–44)
AST: 46 IU/L — ABNORMAL HIGH (ref 0–40)
Alkaline Phosphatase: 61 IU/L (ref 39–117)
Bilirubin Total: 0.4 mg/dL (ref 0.0–1.2)
Bilirubin, Direct: 0.13 mg/dL (ref 0.00–0.40)
Total Protein: 6.8 g/dL (ref 6.0–8.5)

## 2015-02-12 ENCOUNTER — Ambulatory Visit (INDEPENDENT_AMBULATORY_CARE_PROVIDER_SITE_OTHER): Payer: 59 | Admitting: Family Medicine

## 2015-02-12 ENCOUNTER — Encounter: Payer: Self-pay | Admitting: Family Medicine

## 2015-02-12 VITALS — BP 132/90 | Ht 71.0 in | Wt 250.1 lb

## 2015-02-12 DIAGNOSIS — Z13220 Encounter for screening for lipoid disorders: Secondary | ICD-10-CM | POA: Diagnosis not present

## 2015-02-12 DIAGNOSIS — E785 Hyperlipidemia, unspecified: Secondary | ICD-10-CM

## 2015-02-12 DIAGNOSIS — I10 Essential (primary) hypertension: Secondary | ICD-10-CM

## 2015-02-12 DIAGNOSIS — R748 Abnormal levels of other serum enzymes: Secondary | ICD-10-CM | POA: Diagnosis not present

## 2015-02-12 DIAGNOSIS — R7301 Impaired fasting glucose: Secondary | ICD-10-CM | POA: Insufficient documentation

## 2015-02-12 DIAGNOSIS — Z79899 Other long term (current) drug therapy: Secondary | ICD-10-CM | POA: Diagnosis not present

## 2015-02-12 NOTE — Progress Notes (Signed)
   Subjective:    Patient ID: Christopher Bowers, male    DOB: [birth year], 49 y.o.   MRN: [id number] Patient arrives office with 4 distinct concerns. Hypertension This is a chronic problem. The current episode started more than 1 year ago. There are no compliance problems.    trying to watch salt intake. Tries not to miss blood pressure medicine. Patient is in today for a 6 month follow up. Patient would like to discuss changes made to medications by another MD from Lisinopril to Valsartan.  Patient had chronic cough. Complete notes by Dr. work reviewed today and presence of patient. Dr. were recommended changing lisinopril to valsartan. Discussed with patient rationale behind this.  Results for orders placed or performed in visit on 02/06/15  Lipid panel  Result Value Ref Range   Cholesterol, Total 200 (H) 100 - 199 mg/dL   Triglycerides 130 0 - 149 mg/dL   HDL 44 >39 mg/dL   VLDL Cholesterol Cal 26 5 - 40 mg/dL   LDL Calculated 130 (H) 0 - 99 mg/dL   Chol/HDL Ratio 4.5 0.0 - 5.0 ratio units  Hepatic function panel  Result Value Ref Range   Total Protein 6.8 6.0 - 8.5 g/dL   Albumin 4.9 3.5 - 5.5 g/dL   Bilirubin Total 0.4 0.0 - 1.2 mg/dL   Bilirubin, Direct 0.13 0.00 - 0.40 mg/dL   Alkaline Phosphatase 61 39 - 117 IU/L   AST 46 (H) 0 - 40 IU/L   ALT 67 (H) 0 - 44 IU/L  Glucose, random  Result Value Ref Range   Glucose 105 (H) 65 - 99 mg/dL   see prior notes. Thinking now sharper. Less fogginess. Feels he is accommodated to his history of serious sepsis and secondary symptoms   Fa is type two diabetes, Drinks soft drinks etc. Review of Systems    no headache no chest pain no back pain no abdominal pain no change in bowel habits Objective:   Physical Exam  Blood pressure good on repeat vitals stable HEENT normal neck supple. Lungs clear heart rare rhythm abdomen benign      Assessment & Plan:  Impression 1 hypertension good control #2 chronic cough improved potentially related  to medicine change #3 mental fogginess post severe sepsis with secondary brain impact improving #4 impaired fasting glucose discussed #5 hyperlipidemia discussed #6 elevated liver enzymes discussed plan recheck in 3 months. Patient work hard on diet. Check days. Split test before that visit. May need a workup for elevated liver enzymes patient to cut down alcohol intake WSL

## 2015-04-14 ENCOUNTER — Other Ambulatory Visit: Payer: Self-pay | Admitting: Family Medicine

## 2015-04-28 ENCOUNTER — Encounter (HOSPITAL_BASED_OUTPATIENT_CLINIC_OR_DEPARTMENT_OTHER): Payer: Self-pay | Admitting: *Deleted

## 2015-04-28 NOTE — Progress Notes (Signed)
NPO AFTER MN WITH EXCEPTION CLEAR LIQUIDS UNTIL 0700 (NO CREAM/ MILK PRODUCT) .   ARRIVE AT 1130.  NEEDS ISTAT .  CURRENT EKG IN CHART AND EPIC.  WILL TAKE AM MEDS W/ SIPS OF WATER.

## 2015-05-01 ENCOUNTER — Ambulatory Visit (HOSPITAL_BASED_OUTPATIENT_CLINIC_OR_DEPARTMENT_OTHER): Payer: 59 | Admitting: Anesthesiology

## 2015-05-01 ENCOUNTER — Encounter (HOSPITAL_BASED_OUTPATIENT_CLINIC_OR_DEPARTMENT_OTHER): Payer: Self-pay

## 2015-05-01 ENCOUNTER — Encounter (HOSPITAL_BASED_OUTPATIENT_CLINIC_OR_DEPARTMENT_OTHER)
Admission: RE | Disposition: A | Discharge status: Home / Self Care | Payer: Self-pay | Source: Ambulatory Visit | Attending: Orthopedic Surgery

## 2015-05-01 ENCOUNTER — Ambulatory Visit (HOSPITAL_BASED_OUTPATIENT_CLINIC_OR_DEPARTMENT_OTHER)
Admission: RE | Admit: 2015-05-01 | Discharge: 2015-05-01 | Disposition: A | Discharge status: Home / Self Care | Payer: 59 | Source: Ambulatory Visit | Attending: Orthopedic Surgery | Admitting: Orthopedic Surgery

## 2015-05-01 DIAGNOSIS — Z79899 Other long term (current) drug therapy: Secondary | ICD-10-CM | POA: Insufficient documentation

## 2015-05-01 DIAGNOSIS — G5601 Carpal tunnel syndrome, right upper limb: Secondary | ICD-10-CM | POA: Insufficient documentation

## 2015-05-01 DIAGNOSIS — K219 Gastro-esophageal reflux disease without esophagitis: Secondary | ICD-10-CM | POA: Diagnosis not present

## 2015-05-01 DIAGNOSIS — Z8249 Family history of ischemic heart disease and other diseases of the circulatory system: Secondary | ICD-10-CM | POA: Diagnosis not present

## 2015-05-01 DIAGNOSIS — I10 Essential (primary) hypertension: Secondary | ICD-10-CM | POA: Insufficient documentation

## 2015-05-01 DIAGNOSIS — G4733 Obstructive sleep apnea (adult) (pediatric): Secondary | ICD-10-CM | POA: Diagnosis not present

## 2015-05-01 DIAGNOSIS — Z881 Allergy status to other antibiotic agents status: Secondary | ICD-10-CM | POA: Diagnosis not present

## 2015-05-01 HISTORY — DX: Carpal tunnel syndrome, right upper limb: G56.01

## 2015-05-01 HISTORY — DX: Personal history of other diseases of the digestive system: Z87.19

## 2015-05-01 HISTORY — PX: CARPAL TUNNEL RELEASE: SHX101

## 2015-05-01 HISTORY — DX: Personal history of other medical treatment: Z92.89

## 2015-05-01 HISTORY — DX: Personal history of other diseases of the respiratory system: Z87.09

## 2015-05-01 HISTORY — DX: Gastro-esophageal reflux disease without esophagitis: K21.9

## 2015-05-01 LAB — POCT I-STAT 4, (NA,K, GLUC, HGB,HCT)
Glucose, Bld: 134 mg/dL — ABNORMAL HIGH (ref 65–99)
HEMATOCRIT: 45 % (ref 39.0–52.0)
Hemoglobin: 15.3 g/dL (ref 13.0–17.0)
Potassium: 4.1 mmol/L (ref 3.5–5.1)
Sodium: 137 mmol/L (ref 135–145)

## 2015-05-01 SURGERY — CARPAL TUNNEL RELEASE
Anesthesia: Monitor Anesthesia Care | Laterality: Right

## 2015-05-01 MED ORDER — FENTANYL CITRATE (PF) 100 MCG/2ML IJ SOLN
INTRAMUSCULAR | Status: DC | PRN
  Administered 2015-05-01 (×2): 50 ug via INTRAVENOUS

## 2015-05-01 MED ORDER — PROPOFOL 500 MG/50ML IV EMUL
INTRAVENOUS | Status: DC | PRN
  Administered 2015-05-01: 25 ug/kg/min via INTRAVENOUS

## 2015-05-01 MED ORDER — VITAMIN C 500 MG PO TABS: 500.0000 mg | ORAL_TABLET | Freq: Every day | ORAL | Status: DC

## 2015-05-01 MED ORDER — LIDOCAINE HCL (CARDIAC) 20 MG/ML IV SOLN
INTRAVENOUS | Status: DC | PRN
  Administered 2015-05-01: 50 mg via INTRAVENOUS

## 2015-05-01 MED ORDER — CLINDAMYCIN PHOSPHATE 900 MG/50ML IV SOLN
INTRAVENOUS | Status: AC
  Filled 2015-05-01: qty 50

## 2015-05-01 MED ORDER — LIDOCAINE HCL (CARDIAC) 20 MG/ML IV SOLN
INTRAVENOUS | Status: AC
  Filled 2015-05-01: qty 5

## 2015-05-01 MED ORDER — OXYCODONE-ACETAMINOPHEN 5-325 MG PO TABS: 1.0000 | ORAL_TABLET | ORAL | Status: DC | PRN

## 2015-05-01 MED ORDER — BUPIVACAINE HCL (PF) 0.25 % IJ SOLN
INTRAMUSCULAR | Status: DC | PRN
  Administered 2015-05-01: 10 mL

## 2015-05-01 MED ORDER — CLINDAMYCIN PHOSPHATE 900 MG/50ML IV SOLN
900.0000 mg | INTRAVENOUS | Status: AC
  Administered 2015-05-01: 900 mg via INTRAVENOUS
  Filled 2015-05-01: qty 50

## 2015-05-01 MED ORDER — ONDANSETRON HCL 4 MG/2ML IJ SOLN
4.0000 mg | Freq: Once | INTRAMUSCULAR | Status: DC | PRN
  Filled 2015-05-01: qty 2

## 2015-05-01 MED ORDER — MIDAZOLAM HCL 5 MG/5ML IJ SOLN
INTRAMUSCULAR | Status: DC | PRN
  Administered 2015-05-01: 2 mg via INTRAVENOUS

## 2015-05-01 MED ORDER — CHLORHEXIDINE GLUCONATE 4 % EX LIQD
60.0000 mL | Freq: Once | CUTANEOUS | Status: DC
  Filled 2015-05-01: qty 60

## 2015-05-01 MED ORDER — PROPOFOL 500 MG/50ML IV EMUL
INTRAVENOUS | Status: AC
  Filled 2015-05-01: qty 50

## 2015-05-01 MED ORDER — LACTATED RINGERS IV SOLN
INTRAVENOUS | Status: DC
  Administered 2015-05-01: 12:00:00 via INTRAVENOUS
  Filled 2015-05-01: qty 1000

## 2015-05-01 MED ORDER — MIDAZOLAM HCL 2 MG/2ML IJ SOLN
INTRAMUSCULAR | Status: AC
  Filled 2015-05-01: qty 2

## 2015-05-01 MED ORDER — ONDANSETRON HCL 4 MG/2ML IJ SOLN
INTRAMUSCULAR | Status: AC
  Filled 2015-05-01: qty 2

## 2015-05-01 MED ORDER — LIDOCAINE HCL (PF) 1 % IJ SOLN
INTRAMUSCULAR | Status: DC | PRN
  Administered 2015-05-01: 10 mL

## 2015-05-01 MED ORDER — FENTANYL CITRATE (PF) 100 MCG/2ML IJ SOLN
INTRAMUSCULAR | Status: AC
Start: 2015-05-01 — End: 2015-05-01
  Filled 2015-05-01: qty 2

## 2015-05-01 MED ORDER — ONDANSETRON HCL 4 MG/2ML IJ SOLN
INTRAMUSCULAR | Status: DC | PRN
  Administered 2015-05-01: 4 mg via INTRAVENOUS

## 2015-05-01 MED ORDER — FENTANYL CITRATE (PF) 100 MCG/2ML IJ SOLN
25.0000 ug | INTRAMUSCULAR | Status: DC | PRN
  Filled 2015-05-01: qty 1

## 2015-05-01 MED ORDER — DOCUSATE SODIUM 100 MG PO CAPS: 100.0000 mg | ORAL_CAPSULE | Freq: Two times a day (BID) | ORAL | Status: DC

## 2015-05-01 SURGICAL SUPPLY — 40 items
BANDAGE ELASTIC 3 VELCRO ST LF (GAUZE/BANDAGES/DRESSINGS) ×2 IMPLANT
BLADE SURG 15 STRL LF DISP TIS (BLADE) ×1 IMPLANT
BLADE SURG 15 STRL SS (BLADE) ×1
BNDG CONFORM 3 STRL LF (GAUZE/BANDAGES/DRESSINGS) ×2 IMPLANT
BNDG ESMARK 4X9 LF (GAUZE/BANDAGES/DRESSINGS) ×2 IMPLANT
CORDS BIPOLAR (ELECTRODE) ×2 IMPLANT
COVER BACK TABLE 60X90IN (DRAPES) ×2 IMPLANT
CUFF TOURNIQUET SINGLE 18IN (TOURNIQUET CUFF) ×2 IMPLANT
DRAPE EXTREMITY T 121X128X90 (DRAPE) ×2 IMPLANT
DRAPE LG THREE QUARTER DISP (DRAPES) ×2 IMPLANT
DRAPE SURG 17X23 STRL (DRAPES) ×2 IMPLANT
DRSG EMULSION OIL 3X3 NADH (GAUZE/BANDAGES/DRESSINGS) IMPLANT
GAUZE SPONGE 4X4 12PLY STRL (GAUZE/BANDAGES/DRESSINGS) ×2 IMPLANT
GAUZE XEROFORM 1X8 LF (GAUZE/BANDAGES/DRESSINGS) ×2 IMPLANT
GLOVE BIO SURGEON STRL SZ8 (GLOVE) ×2 IMPLANT
GLOVE BIOGEL PI IND STRL 8.5 (GLOVE) ×1 IMPLANT
GLOVE BIOGEL PI INDICATOR 8.5 (GLOVE) ×1
GOWN STRL REUS W/ TWL LRG LVL3 (GOWN DISPOSABLE) ×2 IMPLANT
GOWN STRL REUS W/TWL LRG LVL3 (GOWN DISPOSABLE) ×2
KIT ROOM TURNOVER WOR (KITS) ×2 IMPLANT
KNIFE CARPAL TUNNEL (BLADE) IMPLANT
NDL SAFETY ECLIPSE 18X1.5 (NEEDLE) IMPLANT
NEEDLE HYPO 18GX1.5 SHARP (NEEDLE)
NEEDLE HYPO 25X1 1.5 SAFETY (NEEDLE) ×4 IMPLANT
NS IRRIG 500ML POUR BTL (IV SOLUTION) ×2 IMPLANT
PACK BASIN DAY SURGERY FS (CUSTOM PROCEDURE TRAY) ×2 IMPLANT
PAD ALCOHOL SWAB (MISCELLANEOUS) ×8 IMPLANT
PAD CAST 3X4 CTTN HI CHSV (CAST SUPPLIES) IMPLANT
PADDING CAST ABS 3INX4YD NS (CAST SUPPLIES) ×1
PADDING CAST ABS COTTON 3X4 (CAST SUPPLIES) ×1 IMPLANT
PADDING CAST COTTON 3X4 STRL (CAST SUPPLIES)
SPONGE GAUZE 4X4 12PLY STER LF (GAUZE/BANDAGES/DRESSINGS) ×2 IMPLANT
STOCKINETTE 4X48 STRL (DRAPES) ×2 IMPLANT
SUT PROLENE 4 0 PS 2 18 (SUTURE) ×2 IMPLANT
SYR BULB 3OZ (MISCELLANEOUS) ×2 IMPLANT
SYR CONTROL 10ML LL (SYRINGE) ×4 IMPLANT
TOWEL OR 17X24 6PK STRL BLUE (TOWEL DISPOSABLE) ×2 IMPLANT
TRAY DSU PREP LF (CUSTOM PROCEDURE TRAY) ×2 IMPLANT
TUBE CONNECTING 12X1/4 (SUCTIONS) IMPLANT
UNDERPAD 30X30 INCONTINENT (UNDERPADS AND DIAPERS) ×2 IMPLANT

## 2015-05-01 NOTE — Anesthesia Postprocedure Evaluation (Signed)
Anesthesia Post Note  Patient: Christopher Bowers  Procedure(s) Performed: Procedure(s) (LRB): RIGHT CARPAL TUNNEL RELEASE (Right)  Patient location during evaluation: PACU Anesthesia Type: General Level of consciousness: awake and alert Pain management: pain level controlled Vital Signs Assessment: post-procedure vital signs reviewed and stable Respiratory status: spontaneous breathing, nonlabored ventilation, respiratory function stable and patient connected to nasal cannula oxygen Cardiovascular status: blood pressure returned to baseline and stable Postop Assessment: no signs of nausea or vomiting Anesthetic complications: no    Last Vitals:  Filed Vitals:   05/01/15 1420 05/01/15 1456  BP: 132/94 139/89  Pulse: 73 72  Temp:  36.5 C  Resp: 19 16    Last Pain: There were no vitals filed for this visit.               [name]

## 2015-05-01 NOTE — Anesthesia Preprocedure Evaluation (Signed)
Anesthesia Evaluation  Patient identified by MRN, date of birth, ID band Patient awake    Reviewed: Allergy & Precautions, NPO status , Patient's Chart, lab work & pertinent test results  History of Anesthesia Complications (+) DIFFICULT AIRWAY and history of anesthetic complications (hx of ARDS requiring intubation, told he was difficult to intubate)  Airway Mallampati: III  TM Distance: >3 FB Neck ROM: Full    Dental no notable dental hx. (+) Dental Advisory Given   Pulmonary sleep apnea ,    Pulmonary exam normal breath sounds clear to auscultation       Cardiovascular hypertension, Pt. on medications Normal cardiovascular exam Rhythm:Regular Rate:Normal     Neuro/Psych PSYCHIATRIC DISORDERS Anxiety Depression negative neurological ROS     GI/Hepatic negative GI ROS, Neg liver ROS, GERD  Medicated and Controlled,  Endo/Other  obesity  Renal/GU negative Renal ROS  negative genitourinary   Musculoskeletal negative musculoskeletal ROS (+)   Abdominal   Peds negative pediatric ROS (+)  Hematology negative hematology ROS (+)   Anesthesia Other Findings   Reproductive/Obstetrics negative OB ROS                             Anesthesia Physical Anesthesia Plan  ASA: II  Anesthesia Plan: MAC   Post-op Pain Management:    Induction: Intravenous  Airway Management Planned: Nasal Cannula  Additional Equipment:   Intra-op Plan:   Post-operative Plan:   Informed Consent: I have reviewed the patients History and Physical, chart, labs and discussed the procedure including the risks, benefits and alternatives for the proposed anesthesia with the patient or authorized representative who has indicated his/her understanding and acceptance.   Dental advisory given  Plan Discussed with: CRNA  Anesthesia Plan Comments:         Anesthesia Quick Evaluation

## 2015-05-01 NOTE — Brief Op Note (Signed)
05/01/2015  1:06 PM  PATIENT:  Christopher Bowers  49 y.o. male    POST-OPERATIVE DIAGNOSIS:  RIGHT HAND CARPAL TUNNEL SYNDROME,  PROCEDURE:  Procedure(s) with comments: RIGHT CARPAL TUNNEL RELEASE (Right) - ANESTHESIA: LOCAL/IV SEDATION  SURGEON:  Surgeon(s) and Role:    * Iran Planas, MD - Primary  PHYSICIAN ASSISTANT:   ASSISTANTS: none   ANESTHESIA:   topical and MAC  EBL:     BLOOD ADMINISTERED:none  DRAINS: none   LOCAL MEDICATIONS USED:  MARCAINE     SPECIMEN:  No Specimen  DISPOSITION OF SPECIMEN:  N/A  COUNTS:  YES  TOURNIQUET:    DICTATION: .Other Dictation: Dictation Number [id number]  PLAN OF CARE: Discharge to home after PACU  PATIENT DISPOSITION:  PACU - hemodynamically stable.   Delay start of Pharmacological VTE agent (>24hrs) due to surgical blood loss or risk of bleeding: not applicable

## 2015-05-01 NOTE — Anesthesia Procedure Notes (Signed)
Procedure Name: MAC Performed by: Mechele Claude Pre-anesthesia Checklist: Patient identified, Timeout performed, Emergency Drugs available, Suction available and Patient being monitored Patient Re-evaluated:Patient Re-evaluated prior to inductionOxygen Delivery Method: Nasal cannula Placement Confirmation: positive ETCO2 and breath sounds checked- equal and bilateral

## 2015-05-01 NOTE — Discharge Instructions (Signed)
KEEP BANDAGE CLEAN AND DRY CALL OFFICE FOR F/U APPT [phone] IN 14 DAYS DR Caralyn Guile CELL [phone] KEEP HAND ELEVATED ABOVE HEART OK TO APPLY ICE TO OPERATIVE AREA CONTACT OFFICE IF ANY WORSENING PAIN OR CONCERNS.          HAND SURGERY    HOME CARE INSTRUCTIONS    The following instructions have been prepared to help you care for yourself upon your return home today.  Wound Care:  Keep your hand elevated above the level of your heart. Do not allow it to dangle by your side. Keep the dressing dry and do not remove it unless your doctor advises you to do so. He will usually change it at the time of you post-op visit. Moving your fingers is advised to stimulate circulation but will depend on the site of your surgery. Of course, if you have a splint applied your doctor will advise you about movement.  Activity:  Do not drive or operate machinery today. Rest today and then you may return to your normal activity and work as indicated by your physician.  Diet: Drink liquids today or eat a light diet. You may resume a regular diet tomorrow.  General expectations: Pain for two or three days. Fingers may become slightly swollen.   Unexpected Observations- Call your doctor if any of these occur: Severe pain not relieved by pain medication. Elevated temperature. Dressing soaked with blood. Inability to move fingers. White or bluish color to fingers.     Post Anesthesia Home Care Instructions  Activity: Get plenty of rest for the remainder of the day. A responsible adult should stay with you for 24 hours following the procedure.  For the next 24 hours, DO NOT: -Drive a car -Paediatric nurse -Drink alcoholic beverages -Take any medication unless instructed by your physician -Make any legal decisions or sign important papers.  Meals: Start with liquid foods such as gelatin or soup. Progress to regular foods as tolerated. Avoid greasy, spicy, heavy foods. If nausea and/or  vomiting occur, drink only clear liquids until the nausea and/or vomiting subsides. Call your physician if vomiting continues.  Special Instructions/Symptoms: Your throat may feel dry or sore from the anesthesia or the breathing tube placed in your throat during surgery. If this causes discomfort, gargle with warm salt water. The discomfort should disappear within 24 hours.  If you had a scopolamine patch placed behind your ear for the management of post- operative nausea and/or vomiting:  1. The medication in the patch is effective for 72 hours, after which it should be removed.  Wrap patch in a tissue and discard in the trash. Wash hands thoroughly with soap and water. 2. You may remove the patch earlier than 72 hours if you experience unpleasant side effects which may include dry mouth, dizziness or visual disturbances. 3. Avoid touching the patch. Wash your hands with soap and water after contact with the patch.

## 2015-05-01 NOTE — Transfer of Care (Addendum)
Last Vitals:  Filed Vitals:   05/01/15 1101  BP: 145/90  Pulse: 71  Temp: 36.6 C  Resp: 14   Immediate Anesthesia Transfer of Care Note  Patient: Christopher Bowers  Procedure(s) Performed: Procedure(s) (LRB): RIGHT CARPAL TUNNEL RELEASE (Right)  Patient Location: PACU  Anesthesia Type: MAC  Level of Consciousness: awake, alert  and oriented  Airway & Oxygen Therapy: Patient Spontanous Breathing and Patient connected to nasal cannula oxygen  Post-op Assessment: Report given to PACU RN and Post -op Vital signs reviewed and stable  Post vital signs: Reviewed and stable  Complications: No apparent anesthesia complications

## 2015-05-01 NOTE — H&P (Signed)
Christopher Bowers is an 49 y.o. male.   Chief Complaint: right hand numbness HPI: s/s c/w right hand carpal tunnel syndrome Pt followed in office Pt here for surgery on right wrist No prior surgery to right wrist Concerned about numbness and tingling in hand  Past Medical History  Diagnosis Date  . Hypertension   . History of gastritis   . GERD (gastroesophageal reflux disease)   . History of hiatal hernia   . Carpal tunnel syndrome of right wrist   . History of acute respiratory failure     Feb 2014  w/  CAP and ARDS  ---  resolved  . History of exercise stress test     01-07-2010   -- negative for ischemia, no chest pain,  hypertensive response to exercise  . OSA (obstructive sleep apnea)     per pt study done 2013 OSA intolerant cpap  ,  pt uses oral appliance    Past Surgical History  Procedure Laterality Date  . Transthoracic echocardiogram  07-11-2012    LVSF  45-55%  . Tonsillectomy  age 64    Family History  Problem Relation Age of Onset  . Diabetes Father   . Heart disease Father   . Diabetes Son    Social History:  reports that he has never smoked. He quit smokeless tobacco use about 2 months ago. His smokeless tobacco use included Snuff. He reports that he drinks about 8.4 oz of alcohol per week. He reports that he does not use illicit drugs.  Allergies:  Allergies  Allergen Reactions  . Augmentin [Amoxicillin-Pot Clavulanate] Rash    Medications Prior to Admission  Medication Sig Dispense Refill  . acetaminophen (TYLENOL) 500 MG tablet Take 500 mg by mouth every 6 (six) hours as needed.    Marland Kitchen albuterol (PROVENTIL HFA;VENTOLIN HFA) 108 (90 BASE) MCG/ACT inhaler Inhale 2 puffs into the lungs every 4 (four) hours as needed for wheezing or shortness of breath.    Marland Kitchen buPROPion (WELLBUTRIN SR) 200 MG 12 hr tablet TAKE 1 TABLET BY MOUTH 2 TIMES DAILY. (Patient taking differently: Take 200 mg by mouth every morning. TAKE 1 TABLET BY MOUTH 2 TIMES DAILY.) 180 tablet 1  .  esomeprazole (NEXIUM) 40 MG capsule TAKE 1 CAPSULE BY MOUTH 2 TIMES DAILY BEFORE A MEAL. (Patient taking differently: TAKE 1 CAPSULE BY MOUTH 2 TIMES DAILY BEFORE A MEAL.--  pt takes in am) 180 capsule 3  . Ibuprofen (ADVIL) 200 MG CAPS Take by mouth as needed.    . valsartan (DIOVAN) 160 MG tablet Take 1 tablet (160 mg total) by mouth daily. (Patient taking differently: Take 160 mg by mouth every morning. ) 30 tablet 11  . Cholecalciferol (VITAMIN D3) 5000 UNITS TABS Take 5,000 Units by mouth daily.      Results for orders placed or performed during the hospital encounter of 05/01/15 (from the past 48 hour(s))  I-STAT 4, (NA,K, GLUC, HGB,HCT)     Status: Abnormal   Collection Time: 05/01/15 11:46 AM  Result Value Ref Range   Sodium 137 135 - 145 mmol/L   Potassium 4.1 3.5 - 5.1 mmol/L   Glucose, Bld 134 (H) 65 - 99 mg/dL   HCT 45.0 39.0 - 52.0 %   Hemoglobin 15.3 13.0 - 17.0 g/dL   No results found.  ROSNO RECENT ILLNESSES OR HOSPITALIZATIONS  Blood pressure 145/90, pulse 71, temperature 97.8 F (36.6 C), temperature source Oral, resp. rate 14, height 5\' 11"  (1.803 m), weight 113.172  kg (249 lb 8 oz), SpO2 97 %. Physical Exam  General Appearance:  Alert, cooperative, no distress, appears stated age  Head:  Normocephalic, without obvious abnormality, atraumatic  Eyes:  Pupils equal, conjunctiva/corneas clear,         Throat: Lips, mucosa, and tongue normal; teeth and gums normal  Neck: No visible masses     Lungs:   respirations unlabored  Chest Wall:  No tenderness or deformity  Heart:  Regular rate and rhythm,  Abdomen:   Soft, non-tender,         Extremities: RIGHT HAND: NO THENAR MUSCLE ATROPHY FINGERS WARM WELL PERFUSED GOOD WRIST AND DIGITAL MOTION  Pulses: 2+ and symmetric  Skin: Skin color, texture, turgor normal, no rashes or lesions     Neurologic: Normal    Assessment/Plan RIGHT HAND CARPAL TUNNEL SYNDROME  RIGHT HAND CARPAL TUNNEL RELEASE  R/B/A  DISCUSSED WITH PT IN OFFICE.  PT VOICED UNDERSTANDING OF PLAN CONSENT SIGNED DAY OF SURGERY PT SEEN AND EXAMINED PRIOR TO OPERATIVE PROCEDURE/DAY OF SURGERY SITE MARKED. QUESTIONS ANSWERED WILL GO HOME FOLLOWING SURGERY  WE ARE PLANNING SURGERY FOR YOUR UPPER EXTREMITY. THE RISKS AND BENEFITS OF SURGERY INCLUDE BUT NOT LIMITED TO BLEEDING INFECTION, DAMAGE TO NEARBY NERVES ARTERIES TENDONS, FAILURE OF SURGERY TO ACCOMPLISH ITS INTENDED GOALS, PERSISTENT SYMPTOMS AND NEED FOR FURTHER SURGICAL INTERVENTION. WITH THIS IN MIND WE WILL PROCEED. I HAVE DISCUSSED WITH THE PATIENT THE PRE AND POSTOPERATIVE REGIMEN AND THE DOS AND DON'TS. PT VOICED UNDERSTANDING AND INFORMED CONSENT SIGNED.  Linna Hoff 05/01/2015, 1:04 PM

## 2015-05-02 ENCOUNTER — Encounter (HOSPITAL_BASED_OUTPATIENT_CLINIC_OR_DEPARTMENT_OTHER): Payer: Self-pay | Admitting: Orthopedic Surgery

## 2015-05-02 NOTE — Op Note (Signed)
NAMEKEMAURION, HORWITZ NO.:  [id number]  MEDICAL RECORD NO.:  [id number]  LOCATION:                               FACILITY:  [facility]  PHYSICIAN:  Linna Hoff IV, M.D.DATE OF BIRTH:  24-Nov-1965  DATE OF PROCEDURE:  05/01/2015 DATE OF DISCHARGE:  05/01/2015                              OPERATIVE REPORT   PREOPERATIVE DIAGNOSIS:  Right hand carpal tunnel syndrome.  POSTOPERATIVE DIAGNOSIS:  Right hand carpal tunnel syndrome.  ATTENDING PHYSICIAN:  Linna Hoff, MD, who scrubbed for the entire procedure.  ASSISTANT SURGEON:  None.  ANESTHESIA:  1% Xylocaine and 0.25% Marcaine local block with IV sedation.  SURGICAL PROCEDURE:  Right hand carpal tunnel release.  SURGICAL INDICATIONS:  Mr. Agne is a right-hand-dominant gentleman with signs and symptoms consistent with right hand carpal tunnel syndrome. This was refractory to conservative treatment.  Risks, benefits, and alternatives were discussed in detail with the patient.  Signed informed consent was obtained.  Risks include, but not limited to bleeding, infection, damage to nearby nerves, arteries, or tendons; loss of motion of wrist and digits, incomplete relief of symptoms, need for further surgical intervention.  DESCRIPTION OF PROCEDURE:  The patient was properly identified in the preoperative holding area, marked with a permanent marker made on the right hand to indicate the correct operative site.  The patient was then brought back to the operating room, placed supine on anesthesia room table where the IV sedation was administered.  The patient tolerated this well.  A well-padded tourniquet was placed on the right brachium and sealed with 1000 drape.  The right upper extremity was then prepped and draped in normal sterile fashion.  A time-out was called, the correct side was identified, and procedure then begun.  Local anesthetic had been administered. A several centimeters incision made  directly in the mid pole.  The wound was then elevated using Esmarch exsanguination and tourniquet insufflated.  Dissection was then carried down through the skin and subcutaneous tissue.  The palmar fascia was incised longitudinally.  Direct exposure of the transverse carpal ligament carried down and under direct visualization, the distal one-half of the transverse carpal ligament released.  Further visualization was then carried out proximally where the remaining portion of transverse carpal ligament as well as portion of the antebrachial fascia were then released.  The wound was then thoroughly irrigated.  The contents of the carpal tunnel were then inspected.  No other abnormalities were noted. Thorough wound irrigation done.  The skin was then closed using horizontal mattress, Prolene sutures.  Xeroform dressing, sterile compressive bandage applied.  The patient tolerated the procedure well, returned to the recovery room in good condition.  POSTPROCEDURE PLAN:  The patient is discharged to home, seen back in the office in approximately 2 weeks for wound check, suture removal, and Gel Flex glove.  Again a postoperative carpal tunnel eval and treat.     Melrose Nakayama, M.D.     FWO/MEDQ  D:  05/01/2015  T:  05/02/2015  Job:  [id number]

## 2015-05-15 ENCOUNTER — Ambulatory Visit: Payer: 59 | Admitting: Family Medicine

## 2015-05-30 ENCOUNTER — Other Ambulatory Visit: Payer: Self-pay | Admitting: Family Medicine

## 2015-05-31 LAB — GLUCOSE, RANDOM: Glucose: 108 mg/dL — ABNORMAL HIGH (ref 65–99)

## 2015-05-31 LAB — LIPID PANEL W/O CHOL/HDL RATIO
Cholesterol, Total: 227 mg/dL — ABNORMAL HIGH (ref 100–199)
HDL: 46 mg/dL (ref >39)
LDL CALC: 153 mg/dL — AB (ref 0–99)
Triglycerides: 139 mg/dL (ref 0–149)
VLDL CHOLESTEROL CAL: 28 mg/dL (ref 5–40)

## 2015-05-31 LAB — HEPATIC FUNCTION PANEL
ALBUMIN: 4.6 g/dL (ref 3.5–5.5)
ALK PHOS: 58 IU/L (ref 39–117)
ALT: 63 IU/L — ABNORMAL HIGH (ref 0–44)
AST: 38 IU/L (ref 0–40)
Bilirubin Total: 0.5 mg/dL (ref 0.0–1.2)
Bilirubin, Direct: 0.14 mg/dL (ref 0.00–0.40)
Total Protein: 6.6 g/dL (ref 6.0–8.5)

## 2015-06-04 ENCOUNTER — Encounter: Payer: Self-pay | Admitting: Family Medicine

## 2015-06-04 ENCOUNTER — Ambulatory Visit (INDEPENDENT_AMBULATORY_CARE_PROVIDER_SITE_OTHER): Payer: 59 | Admitting: Family Medicine

## 2015-06-04 VITALS — BP 122/82 | Ht 71.0 in | Wt 256.0 lb

## 2015-06-04 DIAGNOSIS — R748 Abnormal levels of other serum enzymes: Secondary | ICD-10-CM | POA: Diagnosis not present

## 2015-06-04 MED ORDER — BUPROPION HCL ER (SR) 200 MG PO TB12: 200.0000 mg | ORAL_TABLET | Freq: Every morning | ORAL | Status: DC

## 2015-06-04 MED ORDER — VALSARTAN 160 MG PO TABS: 160.0000 mg | ORAL_TABLET | Freq: Every morning | ORAL | Status: DC

## 2015-06-04 MED ORDER — ESOMEPRAZOLE MAGNESIUM 40 MG PO CPDR: DELAYED_RELEASE_CAPSULE | ORAL | Status: DC

## 2015-06-04 MED FILL — ESOMEPRAZOLE MAG DR 40 MG C: 40 | 90 days supply | Qty: 180 | Fill #0

## 2015-06-04 MED FILL — VALSARTAN 160 MG TABLET: 160 | 90 days supply | Qty: 90 | Fill #0

## 2015-06-04 NOTE — Patient Instructions (Signed)

## 2015-06-04 NOTE — Progress Notes (Signed)
   Subjective:    Patient ID: Christopher Bowers, male    DOB: 06-27-1965, 50 y.o.   MRN: [id number] Patient presents for follow-up of several concerns   Hypertension This is a chronic problem. The current episode started more than 1 year ago. There are no compliance problems.    Results for orders placed or performed in visit on 05/30/15  Lipid Panel w/o Chol/HDL Ratio  Result Value Ref Range   Cholesterol, Total 227 (H) 100 - 199 mg/dL   Triglycerides 139 0 - 149 mg/dL   HDL 46 >39 mg/dL   VLDL Cholesterol Cal 28 5 - 40 mg/dL   LDL Calculated 153 (H) 0 - 99 mg/dL  Hepatic function panel  Result Value Ref Range   Total Protein 6.6 6.0 - 8.5 g/dL   Albumin 4.6 3.5 - 5.5 g/dL   Bilirubin Total 0.5 0.0 - 1.2 mg/dL   Bilirubin, Direct 0.14 0.00 - 0.40 mg/dL   Alkaline Phosphatase 58 39 - 117 IU/L   AST 38 0 - 40 IU/L   ALT 63 (H) 0 - 44 IU/L  Glucose, random  Result Value Ref Range   Glucose 108 (H) 65 - 99 mg/dL   Pt had cts, had surg dec first  Hoping to return to reg [employer] with bp meds and reg activity. Blood pressure medicines reviewed today. Compliant. No longer experiencing any cough with it other than rare tickle  Has cut down alcohol intake. Also is exercising regularly until carpal tunnel syndrome surgery. No prior workup for elevated liver enzymes.  There is significant family history diabetes patient working on sugar intake. Elevated sugars have been initiated.   Patient states no other concerns this visit.  Review of Systems No headache no chest pain and back pain no abdominal pain no change in bowel habits ROS otherwise negative    Objective:   Physical Exam Alert vital stable. HEENT normal. Lungs clear. Heart regular rate and rhythm. Abdomen benign ankles without edema blood pressure good       Assessment & Plan:  Impression 1 hypertension good control with no obvious side effects discussed #2 elevated liver enzymes improved but still persists.  Mild workup warranted discussed. Likely fatty liver #3 impaired fasting glucose discussed including nature. Diabetes. #4 hyperlipidemia old blood work reviewed and discuss. Substantial issue plan educational issue information given. Will recheck LDL again in 6 months if no improvement will add statin. Maintain same blood pressure medicine. Workup for elevated liver enzymes meds refilled [facility]

## 2015-06-18 DIAGNOSIS — M9901 Segmental and somatic dysfunction of cervical region: Secondary | ICD-10-CM | POA: Diagnosis not present

## 2015-06-18 DIAGNOSIS — M9902 Segmental and somatic dysfunction of thoracic region: Secondary | ICD-10-CM | POA: Diagnosis not present

## 2015-06-18 DIAGNOSIS — M9903 Segmental and somatic dysfunction of lumbar region: Secondary | ICD-10-CM | POA: Diagnosis not present

## 2015-06-18 DIAGNOSIS — M9904 Segmental and somatic dysfunction of sacral region: Secondary | ICD-10-CM | POA: Diagnosis not present

## 2015-07-01 DIAGNOSIS — R748 Abnormal levels of other serum enzymes: Secondary | ICD-10-CM | POA: Diagnosis not present

## 2015-07-02 LAB — HEPATITIS C ANTIBODY: Hep C Virus Ab: <0.1 s/co ratio (ref 0.0–0.9)

## 2015-07-02 LAB — IRON AND TIBC
IRON SATURATION: 37 % (ref 15–55)
IRON: 109 ug/dL (ref 38–169)
TIBC: 295 ug/dL (ref 250–450)
UIBC: 186 ug/dL (ref 111–343)

## 2015-07-02 LAB — FERRITIN: FERRITIN: 341 ng/mL (ref 30–400)

## 2015-07-02 LAB — HEPATITIS B SURFACE ANTIGEN: Hepatitis B Surface Ag: NEGATIVE

## 2015-07-03 ENCOUNTER — Ambulatory Visit (HOSPITAL_COMMUNITY)
Admission: RE | Admit: 2015-07-03 | Discharge: 2015-07-03 | Disposition: A | Discharge status: Home / Self Care | Payer: 59 | Source: Ambulatory Visit | Attending: Family Medicine | Admitting: Family Medicine

## 2015-07-03 ENCOUNTER — Telehealth: Payer: Self-pay | Admitting: Family Medicine

## 2015-07-03 DIAGNOSIS — R748 Abnormal levels of other serum enzymes: Secondary | ICD-10-CM | POA: Insufficient documentation

## 2015-07-03 DIAGNOSIS — R932 Abnormal findings on diagnostic imaging of liver and biliary tract: Secondary | ICD-10-CM | POA: Insufficient documentation

## 2015-07-03 DIAGNOSIS — R7989 Other specified abnormal findings of blood chemistry: Secondary | ICD-10-CM | POA: Insufficient documentation

## 2015-07-03 NOTE — Telephone Encounter (Signed)
Pt would like to get a copy of his labs. The results are in the system but no notes from the Dr.

## 2015-07-03 NOTE — Telephone Encounter (Signed)
See message in results

## 2015-07-03 NOTE — Telephone Encounter (Signed)
Patient was notified of results.  

## 2015-07-15 MED FILL — BUPROPION HCL SR 200 MG TAB: 200 | 90 days supply | Qty: 180 | Fill #1

## 2015-08-22 MED FILL — VALSARTAN 160 MG TABLET: 160 | 90 days supply | Qty: 90 | Fill #1

## 2015-09-10 DIAGNOSIS — M9902 Segmental and somatic dysfunction of thoracic region: Secondary | ICD-10-CM | POA: Diagnosis not present

## 2015-09-10 DIAGNOSIS — M9901 Segmental and somatic dysfunction of cervical region: Secondary | ICD-10-CM | POA: Diagnosis not present

## 2015-09-10 DIAGNOSIS — M9903 Segmental and somatic dysfunction of lumbar region: Secondary | ICD-10-CM | POA: Diagnosis not present

## 2015-09-10 DIAGNOSIS — M9904 Segmental and somatic dysfunction of sacral region: Secondary | ICD-10-CM | POA: Diagnosis not present

## 2015-11-19 ENCOUNTER — Telehealth: Payer: 59 | Admitting: Family

## 2015-11-19 DIAGNOSIS — J309 Allergic rhinitis, unspecified: Secondary | ICD-10-CM

## 2015-11-19 DIAGNOSIS — J019 Acute sinusitis, unspecified: Secondary | ICD-10-CM | POA: Diagnosis not present

## 2015-11-19 DIAGNOSIS — R05 Cough: Secondary | ICD-10-CM | POA: Diagnosis not present

## 2015-11-19 DIAGNOSIS — R059 Cough, unspecified: Secondary | ICD-10-CM

## 2015-11-19 MED ORDER — BENZONATATE 100 MG PO CAPS: 100.0000 mg | ORAL_CAPSULE | Freq: Two times a day (BID) | ORAL | Status: DC | PRN

## 2015-11-19 MED ORDER — LEVOFLOXACIN 500 MG PO TABS: 500.0000 mg | ORAL_TABLET | Freq: Every day | ORAL | Status: DC

## 2015-11-19 MED ORDER — FLUTICASONE PROPIONATE 50 MCG/ACT NA SUSP: 2.0000 | Freq: Every day | NASAL | Status: DC

## 2015-11-19 MED FILL — VALSARTAN 160 MG TABLET: 160 | 90 days supply | Qty: 90 | Fill #2

## 2015-11-19 NOTE — Progress Notes (Signed)

## 2015-11-19 NOTE — Progress Notes (Signed)
We are sorry that you are not feeling well.  Here is how we plan to help!  Based on what you have shared with me it looks like you have upper respiratory tract inflammation that has resulted in a significant cough.  Inflammation and infection in the upper respiratory tract is commonly called bronchitis and has four common causes:  Allergies, Viral Infections, Acid Reflux and Bacterial Infections.  Allergies, viruses and acid reflux are treated by controlling symptoms or eliminating the cause. An example might be a cough caused by taking certain blood pressure medications. You stop the cough by changing the medication. Another example might be a cough caused by acid reflux. Controlling the reflux helps control the cough.  Based on your presentation I believe you most likely have A cough due to allergies.  I recommend that you start the an over-the counter-allergy medication such as Claritin 10 mg or Zyrtec 10 mg daily.  I also sent in a prescription for flonase, two sprays in each nostril daily.    In addition you may use A non-prescription cough medication called Robitussin DAC. Take 2 teaspoons every 8 hours or Delsym: take 2 teaspoons every 12 hours., A non-prescription cough medication called Mucinex DM: take 2 tablets every 12 hours. and A prescription cough medication called Tessalon Perles 100mg . You may take 1-2 capsules every 8 hours as needed for your cough.    HOME CARE . Only take medications as instructed by your medical team. . Complete the entire course of an antibiotic. . Drink plenty of fluids and get plenty of rest. . Avoid close contacts especially the very young and the elderly . Cover your mouth if you cough or cough into your sleeve. . Always remember to wash your hands . A steam or ultrasonic humidifier can help congestion.    GET HELP RIGHT AWAY IF: . You develop worsening fever. . You become short of breath . You cough up blood. . Your symptoms persist after you have  completed your treatment plan MAKE SURE YOU   Understand these instructions.  Will watch your condition.  Will get help right away if you are not doing well or get worse.  Your e-visit answers were reviewed by a board certified advanced clinical practitioner to complete your personal care plan.  Depending on the condition, your plan could have included both over the counter or prescription medications. If there is a problem please reply  once you have received a response from your provider. Your safety is important to [place].  If you have drug allergies check your prescription carefully.    You can use MyChart to ask questions about today's visit, request a non-urgent call back, or ask for a work or school excuse for 24 hours related to this e-Visit. If it has been greater than 24 hours you will need to follow up with your provider, or enter a new e-Visit to address those concerns. You will get an e-mail in the next two days asking about your experience.  I hope that your e-visit has been valuable and will speed your recovery. Thank you for using e-visits.

## 2015-12-04 ENCOUNTER — Ambulatory Visit: Payer: 59 | Admitting: Family Medicine

## 2015-12-17 DIAGNOSIS — M9902 Segmental and somatic dysfunction of thoracic region: Secondary | ICD-10-CM | POA: Diagnosis not present

## 2015-12-17 DIAGNOSIS — M9903 Segmental and somatic dysfunction of lumbar region: Secondary | ICD-10-CM | POA: Diagnosis not present

## 2015-12-17 DIAGNOSIS — M9904 Segmental and somatic dysfunction of sacral region: Secondary | ICD-10-CM | POA: Diagnosis not present

## 2015-12-17 DIAGNOSIS — M9901 Segmental and somatic dysfunction of cervical region: Secondary | ICD-10-CM | POA: Diagnosis not present

## 2016-01-15 DIAGNOSIS — M9903 Segmental and somatic dysfunction of lumbar region: Secondary | ICD-10-CM | POA: Diagnosis not present

## 2016-01-15 DIAGNOSIS — M9901 Segmental and somatic dysfunction of cervical region: Secondary | ICD-10-CM | POA: Diagnosis not present

## 2016-01-15 DIAGNOSIS — M9902 Segmental and somatic dysfunction of thoracic region: Secondary | ICD-10-CM | POA: Diagnosis not present

## 2016-01-19 MED FILL — ESOMEPRAZOLE MAG DR 40 MG C: 40 | 90 days supply | Qty: 180 | Fill #1

## 2016-01-20 ENCOUNTER — Encounter: Payer: Self-pay | Admitting: Family Medicine

## 2016-01-20 ENCOUNTER — Ambulatory Visit (INDEPENDENT_AMBULATORY_CARE_PROVIDER_SITE_OTHER): Payer: 59 | Admitting: Family Medicine

## 2016-01-20 VITALS — BP 132/84 | Ht 71.0 in | Wt 262.6 lb

## 2016-01-20 DIAGNOSIS — R7301 Impaired fasting glucose: Secondary | ICD-10-CM

## 2016-01-20 DIAGNOSIS — F329 Major depressive disorder, single episode, unspecified: Secondary | ICD-10-CM

## 2016-01-20 DIAGNOSIS — I10 Essential (primary) hypertension: Secondary | ICD-10-CM | POA: Diagnosis not present

## 2016-01-20 DIAGNOSIS — R413 Other amnesia: Secondary | ICD-10-CM | POA: Diagnosis not present

## 2016-01-20 DIAGNOSIS — R748 Abnormal levels of other serum enzymes: Secondary | ICD-10-CM

## 2016-01-20 DIAGNOSIS — F32A Depression, unspecified: Secondary | ICD-10-CM

## 2016-01-20 DIAGNOSIS — E669 Obesity, unspecified: Secondary | ICD-10-CM

## 2016-01-20 MED ORDER — KETOCONAZOLE 2 % EX CREA: 1.0000 "application " | TOPICAL_CREAM | Freq: Two times a day (BID) | CUTANEOUS | 0 refills | Status: DC

## 2016-01-20 MED FILL — KETOCONAZOLE 2% CREAM: 2 | 20 days supply | Qty: 30 | Fill #0

## 2016-01-20 NOTE — Progress Notes (Signed)
   Subjective:    Patient ID: Christopher Bowers, male    DOB: 03/29/1966, 50 y.o.   MRN: [id number] Patient arrives office for follow-up of numerous concerns. Hypertension  This is a chronic problem. The current episode started more than 1 year ago. Risk factors for coronary artery disease include male gender. Treatments tried: diovan. There are no compliance problems.    Referral to dermatologist to have places on arm checked. Has a few skin tags. Thinks he can go ahead and schedule on his own  Weaned self off well butrin, felt good off of it, pt came off and no nticeablre differend e. No longer feels he needs any further Wellbutrin. States off of that he notes no difference. No depression. Notes his clarity of thought had return to pretty much his prior baseline since his life-threatening illness. Discussed at length.  Reflux medicine helping  Allergy rhinitis medicine helping definitely wants to stay on.      Review of Systems No headache, no major weight loss or weight gain, no chest pain no back pain abdominal pain no change in bowel habits complete ROS otherwise negative     Objective:   Physical Exam  Alert vitals stable blood pressure good weight is up. Discussed lungs clear. 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 At this time doing overall very well  Wellbutrin handling. #2 hypertension good control discussed maintain same meds were 3 allergic rhinitis discussed #4 skin tags discuss patient to try to set up on his own. All medications refilled diet exercise discussed

## 2016-01-20 NOTE — Progress Notes (Signed)
engaged

## 2016-01-22 ENCOUNTER — Telehealth: Payer: Self-pay | Admitting: Family Medicine

## 2016-01-22 NOTE — Telephone Encounter (Signed)
Pt was seen 01/20/16 by Dr. Richardson Landry, states his cold has moved into his chest  Dr. Richardson Landry told pt to call us if he got worse & we would send in an antibiotic for him   [facility] on [address]

## 2016-01-23 MED ORDER — AZITHROMYCIN 250 MG PO TABS: ORAL_TABLET | ORAL | 0 refills | Status: DC

## 2016-01-23 NOTE — Telephone Encounter (Signed)
Spoke with patient and informed her per [facility] as directed. If progressive symptoms or if worse needs follow up. Call us if any problem.

## 2016-01-23 NOTE — Telephone Encounter (Signed)
Z Pak as directed. If progressive symptoms or if worse needs follow-up. Call us if any problem

## 2016-03-08 MED FILL — VALSARTAN 160 MG TABLET: 160 | 90 days supply | Qty: 90 | Fill #3

## 2016-04-07 DIAGNOSIS — M9902 Segmental and somatic dysfunction of thoracic region: Secondary | ICD-10-CM | POA: Diagnosis not present

## 2016-04-07 DIAGNOSIS — M9903 Segmental and somatic dysfunction of lumbar region: Secondary | ICD-10-CM | POA: Diagnosis not present

## 2016-04-07 DIAGNOSIS — M9901 Segmental and somatic dysfunction of cervical region: Secondary | ICD-10-CM | POA: Diagnosis not present

## 2016-05-27 ENCOUNTER — Other Ambulatory Visit: Payer: Self-pay | Admitting: *Deleted

## 2016-05-27 MED ORDER — OSELTAMIVIR PHOSPHATE 75 MG PO CAPS: 75.0000 mg | ORAL_CAPSULE | Freq: Two times a day (BID) | ORAL | 0 refills | Status: DC

## 2016-05-27 MED ORDER — OSELTAMIVIR PHOSPHATE 75 MG PO CAPS: 75.0000 mg | ORAL_CAPSULE | Freq: Two times a day (BID) | ORAL | 0 refills | Status: AC

## 2016-06-03 IMAGING — US US ABDOMEN LIMITED
1 series · 14 of 25 positions shown · non-contrast
Comparison: 07/14/2012 .

CLINICAL DATA: Elevated LFTs.

EXAM:
US ABDOMEN LIMITED - RIGHT UPPER QUADRANT

[Series 1: us abdomen limited · 0.19mm/px · 14 of 81 slices shown]
[im 1/81]
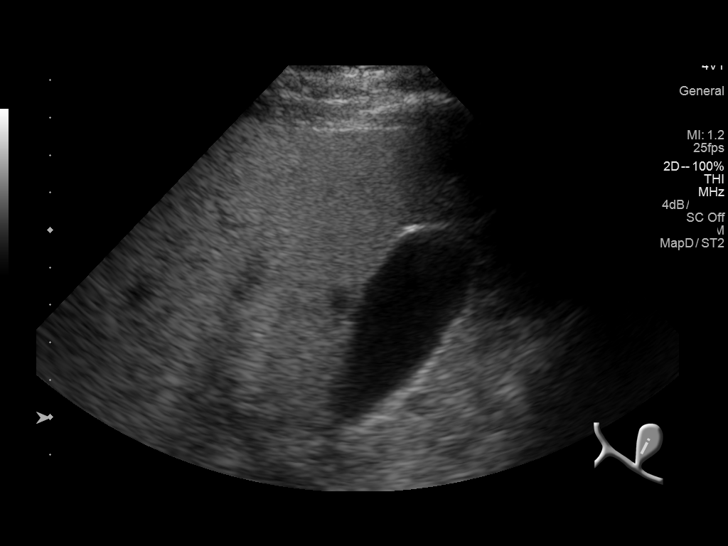
[im 7/81]
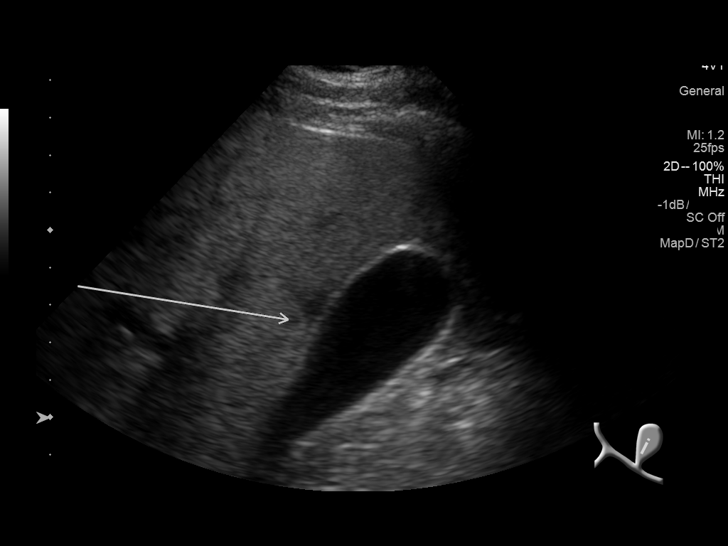
[im 14/81]
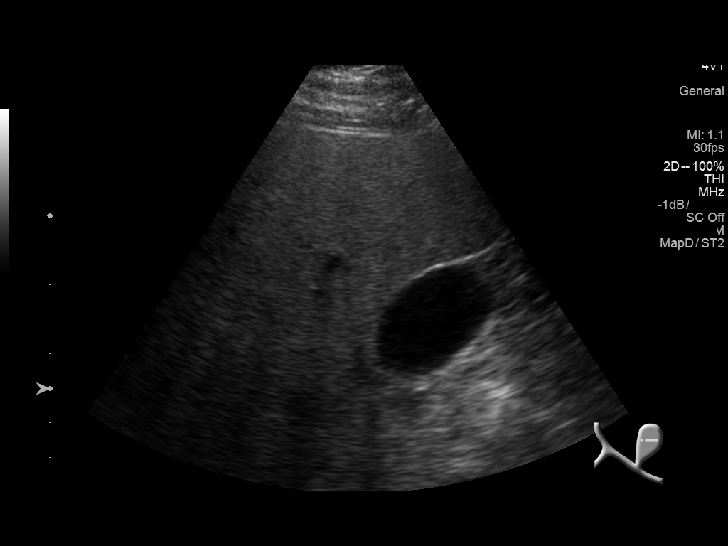
[im 21/81]
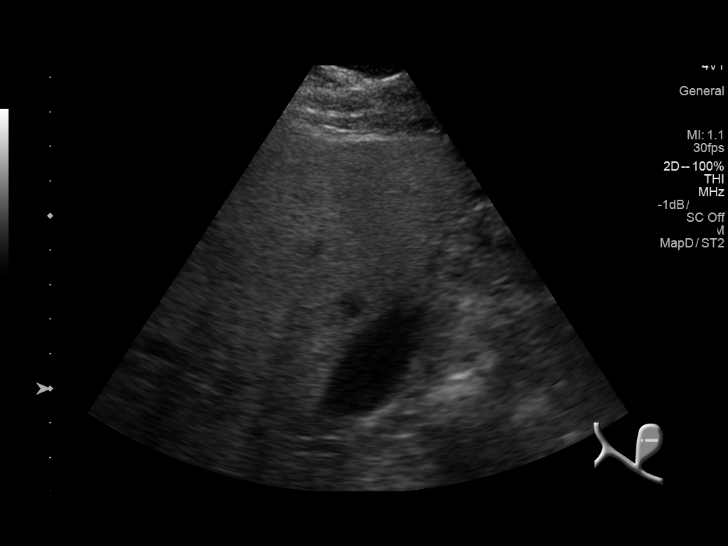
[im 27/81]
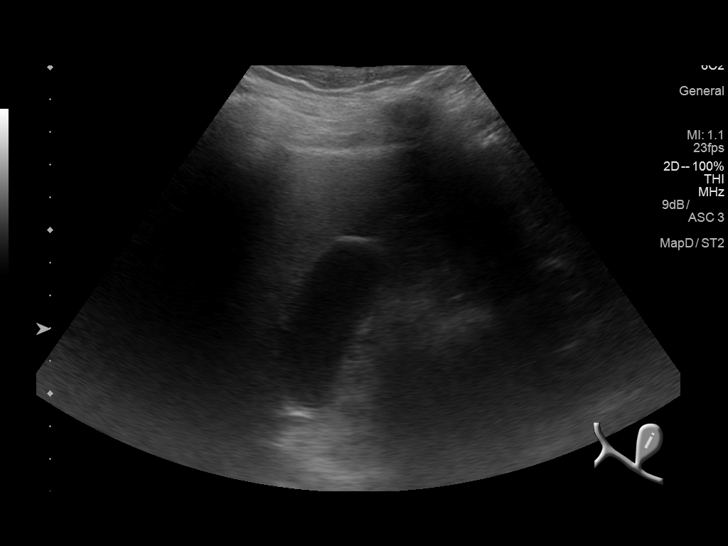
[im 31/81]
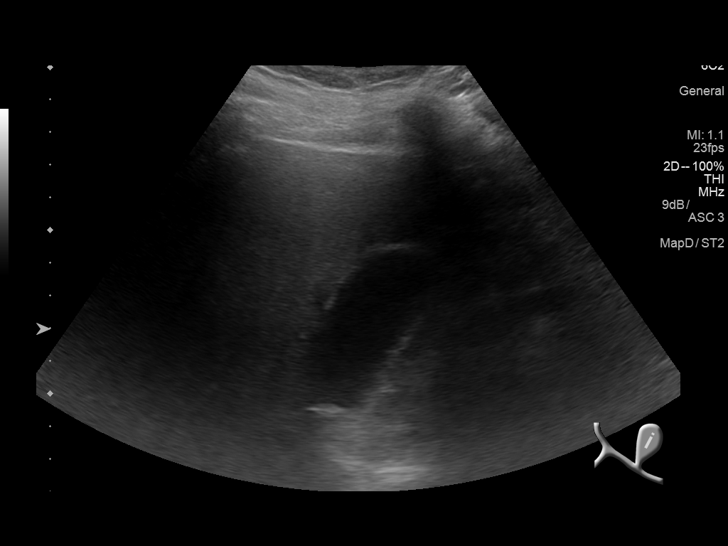
[im 37/81]
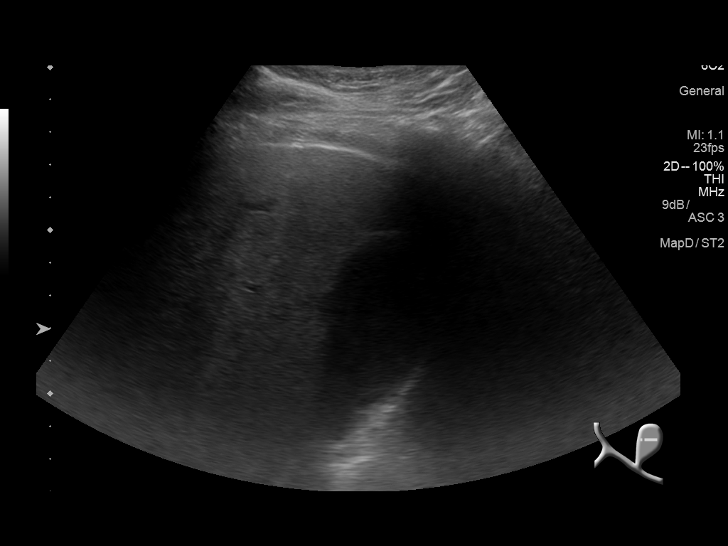
[im 44/81]
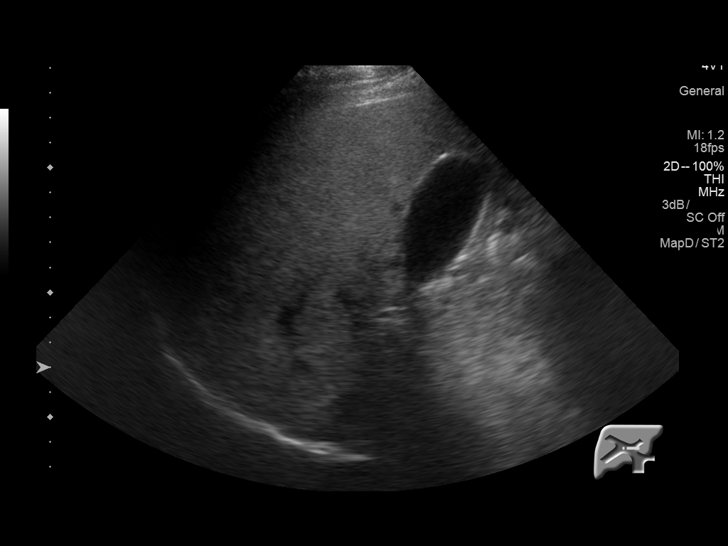
[im 51/81]
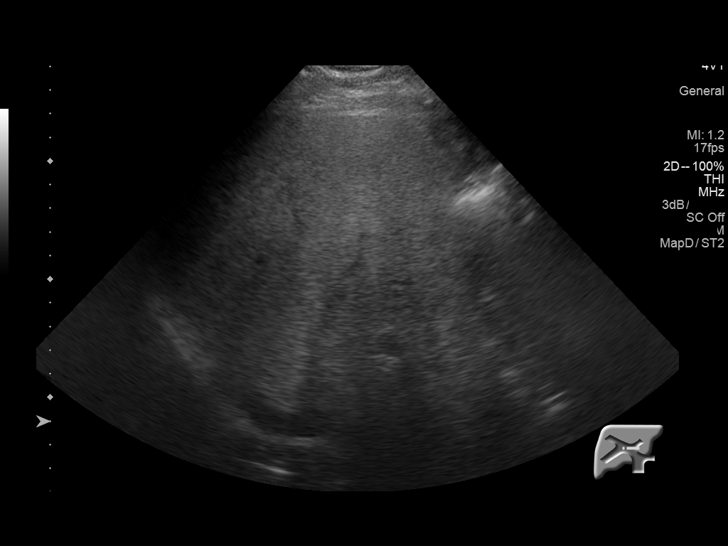
[im 54/81]
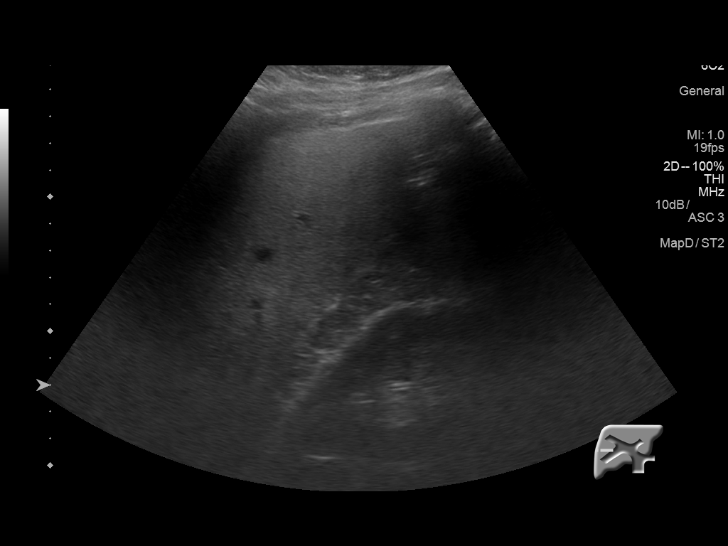
[im 61/81]
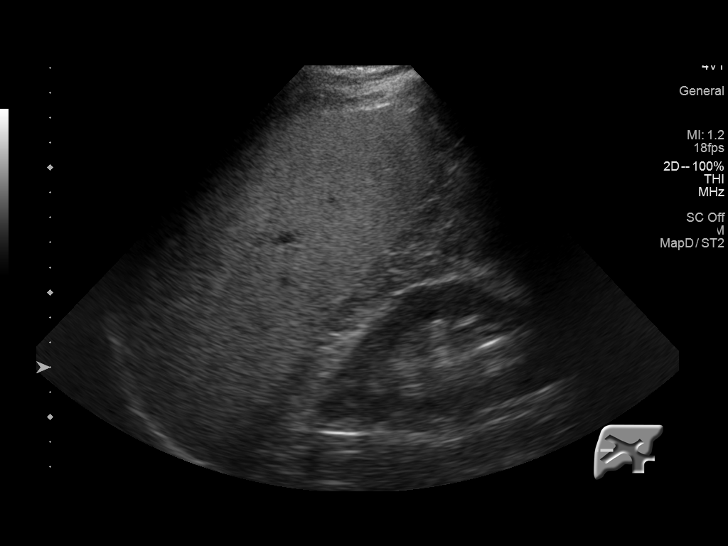
[im 67/81]
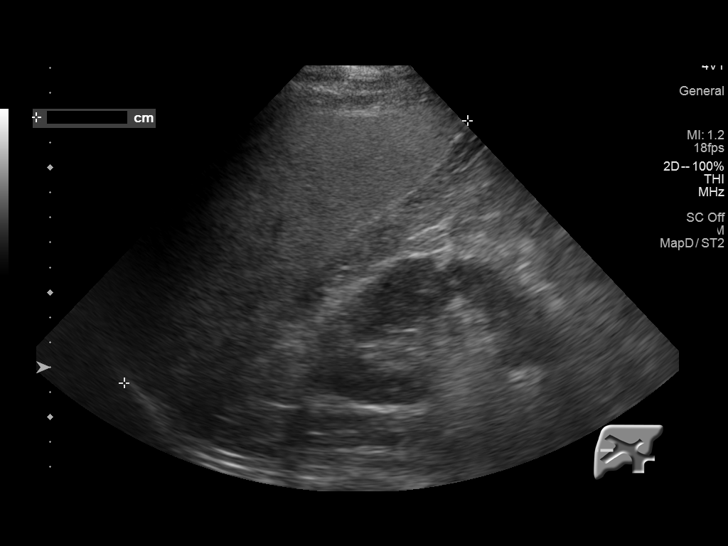
[im 74/81]
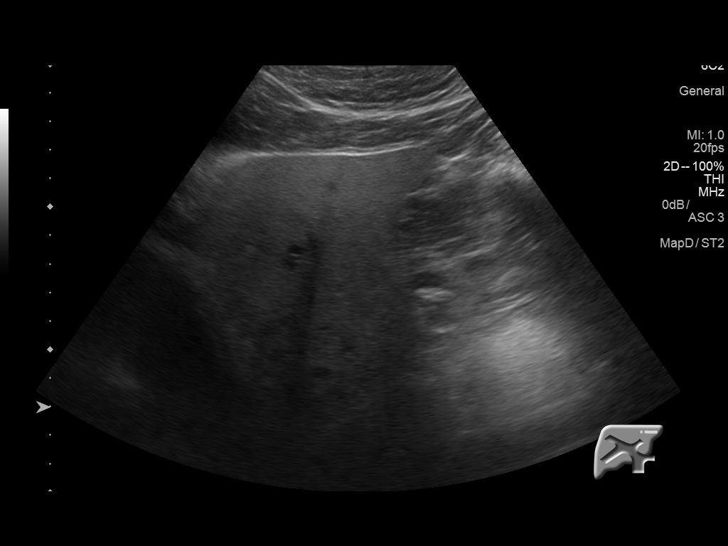
[im 81/81]
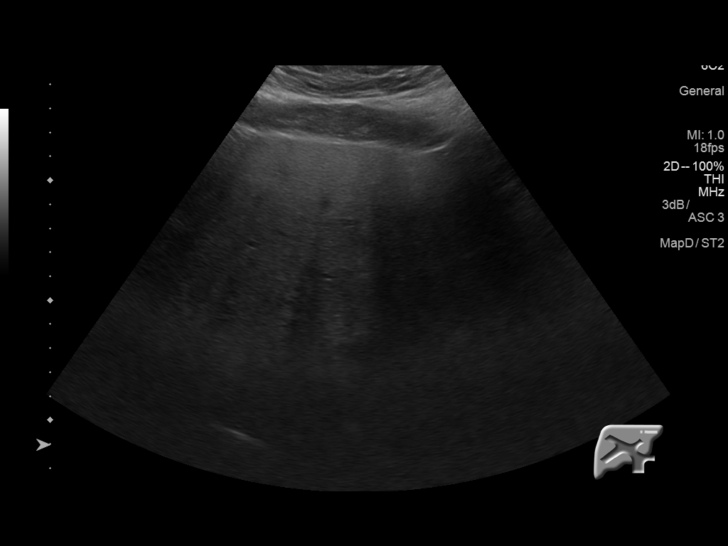

[14 of 25 positions shown; findings below may reference images not displayed]

FINDINGS: Gallbladder:

No gallstones or wall thickening visualized. No sonographic Murphy
sign noted by sonographer.

Common bile duct:

Diameter: 3.8 mm

Liver:

Liver is echogenic consistent fatty infiltration and/or
hepatocellular disease. Focal area of decreased density noted
adjacent to the gallbladder fossa most consistent with focal fatty
sparing.
IMPRESSION: 1. Echogenic liver consistent with fatty infiltration and/or
hepatocellular disease. Area focal fatty sparing appears be present
adjacent to the gallbladder fossa.
2. No gallstones or biliary distention.

## 2016-06-10 ENCOUNTER — Other Ambulatory Visit: Payer: Self-pay | Admitting: Family Medicine

## 2016-06-10 MED FILL — VALSARTAN 160 MG TABLET: 160 | 90 days supply | Qty: 90 | Fill #0

## 2016-06-10 MED FILL — BUPROPION HCL SR 200 MG TAB: 200 | 90 days supply | Qty: 180 | Fill #0

## 2016-06-18 ENCOUNTER — Encounter: Payer: Self-pay | Admitting: Family Medicine

## 2016-06-18 ENCOUNTER — Telehealth: Payer: Self-pay | Admitting: Family Medicine

## 2016-06-18 ENCOUNTER — Ambulatory Visit (INDEPENDENT_AMBULATORY_CARE_PROVIDER_SITE_OTHER): Payer: 59 | Admitting: Family Medicine

## 2016-06-18 VITALS — BP 132/80 | Temp 98.5°F | Wt 259.6 lb

## 2016-06-18 DIAGNOSIS — B349 Viral infection, unspecified: Secondary | ICD-10-CM

## 2016-06-18 DIAGNOSIS — B9689 Other specified bacterial agents as the cause of diseases classified elsewhere: Secondary | ICD-10-CM | POA: Diagnosis not present

## 2016-06-18 DIAGNOSIS — J019 Acute sinusitis, unspecified: Secondary | ICD-10-CM

## 2016-06-18 DIAGNOSIS — E785 Hyperlipidemia, unspecified: Secondary | ICD-10-CM

## 2016-06-18 DIAGNOSIS — Z125 Encounter for screening for malignant neoplasm of prostate: Secondary | ICD-10-CM

## 2016-06-18 DIAGNOSIS — I10 Essential (primary) hypertension: Secondary | ICD-10-CM

## 2016-06-18 MED ORDER — LEVOFLOXACIN 500 MG PO TABS: 500.0000 mg | ORAL_TABLET | Freq: Every day | ORAL | 0 refills | Status: DC

## 2016-06-18 NOTE — Telephone Encounter (Signed)
Patient requesting lab papers has appointment on 2/21 physical.   He would like to them  At [facility] in [place]

## 2016-06-18 NOTE — Progress Notes (Signed)
   Subjective:    Patient ID: Christopher Bowers, male    DOB: 07/12/1965, 51 y.o.   MRN: [id number]  HPI Patient in office today for sinus pressure and congestion.  Patient states he was treated prophylactily for flu exposure this past Tuesday.  He c/o green/yellow mucus-nasal.  He states Benadryl and Nyquil help some. Patient was exposed to the flu he did take some Tamiflu he is currently taking a pack that he had. Patient denies any chest congestion shortness of breath does relate head congestion mucoid drainage does relate some sore throat denies any wheezing difficulty breathing shortness of breath  Patient has history respiratory failure with severe flu  Review of Systems Patient denies shortness of breath relates sore throat patient relates some head congestion and mucoid drainage patient denies vomiting fever chills does relate some light sweats in the back of his neck at nighttime relates energy level fair    Objective:   Physical Exam Neck no masses lungs are clear no crackles heart is regular pulses normal extremities no edema skin warm dry sinuses relates congestion but no tenderness on percussion       Assessment & Plan:  Viral syndrome possible flulike illness Finish out Tamiflu for 5 days Secondary rhinosinusitis Patient states Levaquin only antibiotic that seems to work for him Prescription given he will start on this If doing dramatically better he may take the medicine for 7 days if having moderate symptoms use all 10 days Warning signs regarding respiratory failure and respiratory compromise were discussed in detail patient to immediately go to ER if problems follow-up here if ongoing issues

## 2016-07-05 NOTE — Telephone Encounter (Signed)
Spoke with patient and informed him per Dr.Steve labs have been ordered. Patient verbalized understanding.

## 2016-07-05 NOTE — Telephone Encounter (Signed)
Left message return call 07/05/2016

## 2016-07-05 NOTE — Telephone Encounter (Signed)
Lip liv m7 psa 

## 2016-07-12 ENCOUNTER — Other Ambulatory Visit: Payer: Self-pay | Admitting: Family Medicine

## 2016-07-12 DIAGNOSIS — E785 Hyperlipidemia, unspecified: Secondary | ICD-10-CM | POA: Diagnosis not present

## 2016-07-12 DIAGNOSIS — Z125 Encounter for screening for malignant neoplasm of prostate: Secondary | ICD-10-CM | POA: Diagnosis not present

## 2016-07-12 DIAGNOSIS — I10 Essential (primary) hypertension: Secondary | ICD-10-CM | POA: Diagnosis not present

## 2016-07-13 LAB — BASIC METABOLIC PANEL
BUN / CREAT RATIO: 14 (ref 9–20)
BUN: 16 mg/dL (ref 6–24)
CO2: 24 mmol/L (ref 18–29)
Calcium: 9.4 mg/dL (ref 8.7–10.2)
Chloride: 103 mmol/L (ref 96–106)
Creatinine, Ser: 1.17 mg/dL (ref 0.76–1.27)
GFR calc non Af Amer: 72 mL/min/{1.73_m2} (ref >59)
GFR, EST AFRICAN AMERICAN: 84 mL/min/{1.73_m2} (ref >59)
Glucose: 114 mg/dL — ABNORMAL HIGH (ref 65–99)
POTASSIUM: 4.6 mmol/L (ref 3.5–5.2)
SODIUM: 143 mmol/L (ref 134–144)

## 2016-07-13 LAB — LIPID PANEL
CHOL/HDL RATIO: 5.2 ratio — AB (ref 0.0–5.0)
Cholesterol, Total: 182 mg/dL (ref 100–199)
HDL: 35 mg/dL — AB (ref >39)
LDL Calculated: 129 mg/dL — ABNORMAL HIGH (ref 0–99)
Triglycerides: 90 mg/dL (ref 0–149)
VLDL Cholesterol Cal: 18 mg/dL (ref 5–40)

## 2016-07-13 LAB — HEPATIC FUNCTION PANEL
ALBUMIN: 4.8 g/dL (ref 3.5–5.5)
ALK PHOS: 66 IU/L (ref 39–117)
ALT: 55 IU/L — ABNORMAL HIGH (ref 0–44)
AST: 39 IU/L (ref 0–40)
BILIRUBIN TOTAL: 0.4 mg/dL (ref 0.0–1.2)
BILIRUBIN, DIRECT: 0.12 mg/dL (ref 0.00–0.40)
TOTAL PROTEIN: 6.8 g/dL (ref 6.0–8.5)

## 2016-07-13 LAB — PSA: PROSTATE SPECIFIC AG, SERUM: 1 ng/mL (ref 0.0–4.0)

## 2016-07-21 ENCOUNTER — Encounter: Payer: Self-pay | Admitting: Family Medicine

## 2016-07-21 ENCOUNTER — Ambulatory Visit (INDEPENDENT_AMBULATORY_CARE_PROVIDER_SITE_OTHER): Payer: 59 | Admitting: Family Medicine

## 2016-07-21 VITALS — BP 116/74 | Ht 70.5 in | Wt 244.8 lb

## 2016-07-21 DIAGNOSIS — Z1211 Encounter for screening for malignant neoplasm of colon: Secondary | ICD-10-CM | POA: Diagnosis not present

## 2016-07-21 DIAGNOSIS — F325 Major depressive disorder, single episode, in full remission: Secondary | ICD-10-CM

## 2016-07-21 DIAGNOSIS — Z0000 Encounter for general adult medical examination without abnormal findings: Secondary | ICD-10-CM

## 2016-07-21 DIAGNOSIS — I10 Essential (primary) hypertension: Secondary | ICD-10-CM | POA: Diagnosis not present

## 2016-07-21 DIAGNOSIS — G4733 Obstructive sleep apnea (adult) (pediatric): Secondary | ICD-10-CM | POA: Diagnosis not present

## 2016-07-21 NOTE — Progress Notes (Signed)
Subjective:    Patient ID: Christopher Bowers, male    DOB: Jan 08, 1966, 51 y.o.   MRN: [id number]  HPI The patient comes in today for a wellness visit.  Results for orders placed or performed in visit on [id number]  Basic metabolic panel  Result Value Ref Range   Glucose 114 (H) 65 - 99 mg/dL   BUN 16 6 - 24 mg/dL   Creatinine, Ser 1.17 0.76 - 1.27 mg/dL   GFR calc non Af Amer 72 >59 mL/min/1.73   GFR calc Af Amer 84 >59 mL/min/1.73   BUN/Creatinine Ratio 14 9 - 20   Sodium 143 134 - 144 mmol/L   Potassium 4.6 3.5 - 5.2 mmol/L   Chloride 103 96 - 106 mmol/L   CO2 24 18 - 29 mmol/L   Calcium 9.4 8.7 - 10.2 mg/dL  Lipid panel  Result Value Ref Range   Cholesterol, Total 182 100 - 199 mg/dL   Triglycerides 90 0 - 149 mg/dL   HDL 35 (L) >39 mg/dL   VLDL Cholesterol Cal 18 5 - 40 mg/dL   LDL Calculated 129 (H) 0 - 99 mg/dL   Chol/HDL Ratio 5.2 (H) 0.0 - 5.0 ratio units  Hepatic function panel  Result Value Ref Range   Total Protein 6.8 6.0 - 8.5 g/dL   Albumin 4.8 3.5 - 5.5 g/dL   Bilirubin Total 0.4 0.0 - 1.2 mg/dL   Bilirubin, Direct 0.12 0.00 - 0.40 mg/dL   Alkaline Phosphatase 66 39 - 117 IU/L   AST 39 0 - 40 IU/L   ALT 55 (H) 0 - 44 IU/L  PSA  Result Value Ref Range   Prostate Specific Ag, Serum 1.0 0.0 - 4.0 ng/mL     A review of their health history was completed.  A review of medications was also completed.  Any needed refills; yes  Eating habits: eating good - losing weight  Falls/  MVA accidents in past few months: none  Regular exercise: getting ready to go back since losing some weight  Specialist pt sees on regular basis: no  Preventative health issues were discussed.   Additional concerns: none  Blood pressure medicine and blood pressure levels reviewed today with patient. Compliant with blood pressure medicine. States does not miss a dose. No obvious side effects. Blood pressure generally good when checked elsewhere. Watching salt intake.  Patient  notes ongoing compliance with antidepressant medication. No obvious side effects. Reports does not miss a dose. Overall continues to help depression substantially. No thoughts of homicide or suicide. Would like to maintain medication.  Fa has diabetes   Patient has history of sleep apnea. Could not tolerate sleep apnea device. Has used oral appliance therapy with very good success in the past. Review of Systems  Constitutional: Negative for activity change, appetite change and fever.  HENT: Negative for congestion and rhinorrhea.   Eyes: Negative for discharge.  Respiratory: Negative for cough and wheezing.   Cardiovascular: Negative for chest pain.  Gastrointestinal: Negative for abdominal pain, blood in stool and vomiting.  Genitourinary: Negative for difficulty urinating and frequency.  Musculoskeletal: Negative for neck pain.  Skin: Negative for rash.  Allergic/Immunologic: Negative for environmental allergies and food allergies.  Neurological: Negative for weakness and headaches.  Psychiatric/Behavioral: Negative for agitation.  All other systems reviewed and are negative.      Objective:   Physical Exam  Constitutional: He appears well-developed and well-nourished.  Obesity present  HENT:  Head: Normocephalic and atraumatic.  Right Ear: External ear normal.  Left Ear: External ear normal.  Nose: Nose normal.  Mouth/Throat: Oropharynx is clear and moist.  Eyes: EOM are normal. Pupils are equal, round, and reactive to light.  Neck: Normal range of motion. Neck supple. No thyromegaly present.  Cardiovascular: Normal rate, regular rhythm and normal heart sounds.   No murmur heard. Pulmonary/Chest: Effort normal and breath sounds normal. No respiratory distress. He has no wheezes.  Abdominal: Soft. Bowel sounds are normal. He exhibits no distension and no mass. There is no tenderness.  Genitourinary: Penis normal.  Musculoskeletal: Normal range of motion. He exhibits no edema.   Lymphadenopathy:    He has no cervical adenopathy.  Neurological: He is alert. He exhibits normal muscle tone.  Skin: Skin is warm and dry. No erythema.  Psychiatric: He has a normal mood and affect. His behavior is normal. Judgment normal.  Vitals reviewed.         Assessment & Plan:  Impression 1 wellness exam diet exercise discussed. Colonoscopy encouraged patient agreeable to this. #2 hypertension discussed to maintain same meds #3 depression clinically stable discussed maintain same meds #4 history of obstructive sleep apnea. Uses oral appliance therapy. This is medically necessary for the patient. He is unable to tolerate C Pap. Plan will sign off on prescription for new oral appliance therapy. Maintain usual medications. Diet exercise discussed. Medications refilled. Blood work discussed. GI referral for colonoscopy

## 2016-07-22 ENCOUNTER — Telehealth: Payer: Self-pay | Admitting: Family Medicine

## 2016-07-22 NOTE — Telephone Encounter (Signed)
Needing RX  to fax with form and office notes. Form in your box.

## 2016-07-28 DIAGNOSIS — M9902 Segmental and somatic dysfunction of thoracic region: Secondary | ICD-10-CM | POA: Diagnosis not present

## 2016-07-28 DIAGNOSIS — M9901 Segmental and somatic dysfunction of cervical region: Secondary | ICD-10-CM | POA: Diagnosis not present

## 2016-07-28 DIAGNOSIS — M9903 Segmental and somatic dysfunction of lumbar region: Secondary | ICD-10-CM | POA: Diagnosis not present

## 2016-07-29 ENCOUNTER — Other Ambulatory Visit: Payer: Self-pay | Admitting: Family Medicine

## 2016-07-29 MED FILL — ESOMEPRAZOLE MAG DR 40 MG C: 40 | 90 days supply | Qty: 180 | Fill #0

## 2016-08-25 DIAGNOSIS — G4733 Obstructive sleep apnea (adult) (pediatric): Secondary | ICD-10-CM | POA: Diagnosis not present

## 2016-09-09 MED FILL — VALSARTAN 160 MG TABLET: 160 | 90 days supply | Qty: 90 | Fill #1

## 2016-11-03 DIAGNOSIS — M9901 Segmental and somatic dysfunction of cervical region: Secondary | ICD-10-CM | POA: Diagnosis not present

## 2016-11-03 DIAGNOSIS — M9903 Segmental and somatic dysfunction of lumbar region: Secondary | ICD-10-CM | POA: Diagnosis not present

## 2016-11-03 DIAGNOSIS — M9902 Segmental and somatic dysfunction of thoracic region: Secondary | ICD-10-CM | POA: Diagnosis not present

## 2016-11-15 ENCOUNTER — Other Ambulatory Visit: Payer: Self-pay | Admitting: Family Medicine

## 2016-11-15 MED FILL — BUPROPION HCL SR 200 MG TAB: 200 | 90 days supply | Qty: 180 | Fill #1

## 2016-11-16 ENCOUNTER — Other Ambulatory Visit: Payer: Self-pay | Admitting: Family Medicine

## 2016-11-16 NOTE — Telephone Encounter (Signed)
Last seen 07/21/2016  

## 2016-11-16 NOTE — Telephone Encounter (Signed)
Ok times one six mo ck up due in [place]

## 2016-12-06 ENCOUNTER — Other Ambulatory Visit: Payer: Self-pay | Admitting: *Deleted

## 2016-12-06 MED ORDER — VALSARTAN 160 MG PO TABS: 160.0000 mg | ORAL_TABLET | Freq: Every morning | ORAL | 0 refills | Status: DC

## 2016-12-06 MED FILL — VALSARTAN 160 MG TABLET: 160 | 90 days supply | Qty: 90 | Fill #0

## 2017-01-05 ENCOUNTER — Telehealth: Payer: Self-pay | Admitting: Family Medicine

## 2017-01-05 DIAGNOSIS — Z79899 Other long term (current) drug therapy: Secondary | ICD-10-CM

## 2017-01-05 DIAGNOSIS — E785 Hyperlipidemia, unspecified: Secondary | ICD-10-CM

## 2017-01-05 DIAGNOSIS — R739 Hyperglycemia, unspecified: Secondary | ICD-10-CM

## 2017-01-05 NOTE — Telephone Encounter (Signed)
Last labs 07/12/16 psa, liver, lipid, bmp

## 2017-01-05 NOTE — Telephone Encounter (Signed)
Orders ready. Left message to return call to  Notify pt.

## 2017-01-05 NOTE — Telephone Encounter (Signed)
Patient wants to know if he needs blood work before his appointment on 02/02/17 with Dr. Richardson Landry?

## 2017-01-05 NOTE — Telephone Encounter (Signed)
Lip liv glu 

## 2017-01-10 NOTE — Telephone Encounter (Signed)
Patient notified

## 2017-01-19 ENCOUNTER — Ambulatory Visit: Payer: 59 | Admitting: Family Medicine

## 2017-01-25 DIAGNOSIS — Z79899 Other long term (current) drug therapy: Secondary | ICD-10-CM | POA: Diagnosis not present

## 2017-01-25 DIAGNOSIS — R739 Hyperglycemia, unspecified: Secondary | ICD-10-CM | POA: Diagnosis not present

## 2017-01-25 DIAGNOSIS — E785 Hyperlipidemia, unspecified: Secondary | ICD-10-CM | POA: Diagnosis not present

## 2017-01-26 ENCOUNTER — Ambulatory Visit: Payer: 59 | Admitting: Family Medicine

## 2017-01-26 LAB — LIPID PANEL
CHOLESTEROL TOTAL: 209 mg/dL — AB (ref 100–199)
Chol/HDL Ratio: 3.9 ratio (ref 0.0–5.0)
HDL: 54 mg/dL (ref >39)
LDL Calculated: 141 mg/dL — ABNORMAL HIGH (ref 0–99)
TRIGLYCERIDES: 71 mg/dL (ref 0–149)
VLDL CHOLESTEROL CAL: 14 mg/dL (ref 5–40)

## 2017-01-26 LAB — HEPATIC FUNCTION PANEL
ALBUMIN: 5.1 g/dL (ref 3.5–5.5)
ALK PHOS: 61 IU/L (ref 39–117)
ALT: 31 IU/L (ref 0–44)
AST: 29 IU/L (ref 0–40)
Bilirubin Total: 0.4 mg/dL (ref 0.0–1.2)
Bilirubin, Direct: 0.14 mg/dL (ref 0.00–0.40)
Total Protein: 7.1 g/dL (ref 6.0–8.5)

## 2017-01-26 LAB — GLUCOSE, RANDOM: GLUCOSE: 133 mg/dL — AB (ref 65–99)

## 2017-02-02 ENCOUNTER — Encounter: Payer: Self-pay | Admitting: Family Medicine

## 2017-02-02 ENCOUNTER — Ambulatory Visit (INDEPENDENT_AMBULATORY_CARE_PROVIDER_SITE_OTHER): Payer: 59 | Admitting: Family Medicine

## 2017-02-02 VITALS — BP 122/80 | Ht 70.5 in | Wt 235.0 lb

## 2017-02-02 DIAGNOSIS — M9901 Segmental and somatic dysfunction of cervical region: Secondary | ICD-10-CM | POA: Diagnosis not present

## 2017-02-02 DIAGNOSIS — F411 Generalized anxiety disorder: Secondary | ICD-10-CM

## 2017-02-02 DIAGNOSIS — R7309 Other abnormal glucose: Secondary | ICD-10-CM | POA: Diagnosis not present

## 2017-02-02 DIAGNOSIS — M9903 Segmental and somatic dysfunction of lumbar region: Secondary | ICD-10-CM | POA: Diagnosis not present

## 2017-02-02 DIAGNOSIS — I10 Essential (primary) hypertension: Secondary | ICD-10-CM | POA: Diagnosis not present

## 2017-02-02 DIAGNOSIS — M9902 Segmental and somatic dysfunction of thoracic region: Secondary | ICD-10-CM | POA: Diagnosis not present

## 2017-02-02 DIAGNOSIS — R413 Other amnesia: Secondary | ICD-10-CM

## 2017-02-02 LAB — POCT GLYCOSYLATED HEMOGLOBIN (HGB A1C): HEMOGLOBIN A1C: 4.4

## 2017-02-02 MED ORDER — VALSARTAN 160 MG PO TABS: 160.0000 mg | ORAL_TABLET | Freq: Every morning | ORAL | 1 refills | Status: DC

## 2017-02-02 MED ORDER — ESOMEPRAZOLE MAGNESIUM 40 MG PO CPDR: DELAYED_RELEASE_CAPSULE | ORAL | 1 refills | Status: DC

## 2017-02-02 MED ORDER — ALBUTEROL SULFATE HFA 108 (90 BASE) MCG/ACT IN AERS: 2.0000 | INHALATION_SPRAY | RESPIRATORY_TRACT | 1 refills | Status: DC | PRN

## 2017-02-02 MED ORDER — BUPROPION HCL ER (SR) 200 MG PO TB12: 200.0000 mg | ORAL_TABLET | Freq: Two times a day (BID) | ORAL | 1 refills | Status: DC

## 2017-02-02 MED ORDER — FLUTICASONE PROPIONATE 50 MCG/ACT NA SUSP: 2.0000 | Freq: Every day | NASAL | 6 refills | Status: DC

## 2017-02-02 MED FILL — VENTOLIN HFA 90 MCG INHALER: 108 (90 BAS | 17 days supply | Qty: 18 | Fill #0

## 2017-02-02 MED FILL — ESOMEPRAZOLE MAG DR 40 MG C: 40 | 90 days supply | Qty: 180 | Fill #0

## 2017-02-02 MED FILL — FLUTICASONE PROP 50 MCG SPR: 50 | 30 days supply | Qty: 16 | Fill #0

## 2017-02-02 NOTE — Progress Notes (Signed)
   Subjective:    Patient ID: Christopher Bowers, male    DOB: 04-Apr-1966, 51 y.o.   MRN: [id number]  Hypertension  This is a recurrent problem. The current episode started more than 1 year ago.   On Diovan 160 mg daily.Eats healthy,and exercise occasionally.  Blood pressure medicine and blood pressure levels reviewed today with patient. Compliant with blood pressure medicine. States does not miss a dose. No obvious side effects. Blood pressure generally good when checked elsewhere. Watching salt intake.  Pt has lost a lot of weight, pt eating so so, diet not the best, Pt now back up to 230  Results for orders placed or performed in visit on 01/05/17  Lipid panel  Result Value Ref Range   Cholesterol, Total 209 (H) 100 - 199 mg/dL   Triglycerides 71 0 - 149 mg/dL   HDL 54 >39 mg/dL   VLDL Cholesterol Cal 14 5 - 40 mg/dL   LDL Calculated 141 (H) 0 - 99 mg/dL   Chol/HDL Ratio 3.9 0.0 - 5.0 ratio  Hepatic function panel  Result Value Ref Range   Total Protein 7.1 6.0 - 8.5 g/dL   Albumin 5.1 3.5 - 5.5 g/dL   Bilirubin Total 0.4 0.0 - 1.2 mg/dL   Bilirubin, Direct 0.14 0.00 - 0.40 mg/dL   Alkaline Phosphatase 61 39 - 117 IU/L   AST 29 0 - 40 IU/L   ALT 31 0 - 44 IU/L  Glucose, random  Result Value Ref Range   Glucose 133 (H) 65 - 99 mg/dL    Patient notes ongoing compliance with antidepressant medication. No obvious side effects. Reports does not miss a dose. Overall continues to help depression substantially. No thoughts of homicide or suicide. Would like to maintain medication.  States sig stress with spouses adult children  Though t is clear now work going well. Overall good clarity of thought. No longer experiencing confusing spells as before. 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 See prior notes  Fa has hx of tye two diab   Results for orders placed or performed in visit on 02/02/17  POCT glycosylated hemoglobin (Hb A1C)  Result Value Ref  Range   Hemoglobin A1C 4.4     Review of Systems No headache, no major weight loss or weight gain, no chest pain no back pain abdominal pain no change in bowel habits complete ROS otherwise negative     Objective:   Physical Exam  Alert and oriented, vitals reviewed and stable, NAD ENT-TM's and ext canals WNL bilat via otoscopic exam Soft palate, tonsils and post pharynx WNL via oropharyngeal exam Neck-symmetric, no masses; thyroid nonpalpable and nontender Pulmonary-no tachypnea or accessory muscle use; Clear without wheezes via auscultation Card--no abnrml murmurs, rhythm reg and rate WNL Carotid pulses symmetric, without bruits       Assessment & Plan:  Impression 1 hypertension good control discussed maintain same meds #2 generalized anxiety disorder. Ongoing but substantially improved. 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 Clinically much improved fortunately discussed. #4 chronic reflux clinically stable discussed #5 reactive airways rare use plan flu shot. Medications refilled. Diet exercise discussed.

## 2017-02-05 DIAGNOSIS — F411 Generalized anxiety disorder: Secondary | ICD-10-CM | POA: Insufficient documentation

## 2017-02-23 MED FILL — BUPROPION HCL SR 200 MG TAB: 200 | 90 days supply | Qty: 180 | Fill #0

## 2017-02-25 ENCOUNTER — Ambulatory Visit (HOSPITAL_COMMUNITY)
Admission: RE | Admit: 2017-02-25 | Discharge: 2017-02-25 | Disposition: A | Discharge status: Home / Self Care | Payer: 59 | Source: Ambulatory Visit | Attending: Family Medicine | Admitting: Family Medicine

## 2017-02-25 ENCOUNTER — Ambulatory Visit (INDEPENDENT_AMBULATORY_CARE_PROVIDER_SITE_OTHER): Payer: 59 | Admitting: Family Medicine

## 2017-02-25 ENCOUNTER — Telehealth: Payer: Self-pay | Admitting: Family Medicine

## 2017-02-25 ENCOUNTER — Encounter: Payer: Self-pay | Admitting: Family Medicine

## 2017-02-25 VITALS — BP 110/80 | Temp 98.1°F | Wt 233.2 lb

## 2017-02-25 DIAGNOSIS — J019 Acute sinusitis, unspecified: Secondary | ICD-10-CM

## 2017-02-25 DIAGNOSIS — R509 Fever, unspecified: Secondary | ICD-10-CM | POA: Diagnosis not present

## 2017-02-25 DIAGNOSIS — J209 Acute bronchitis, unspecified: Secondary | ICD-10-CM

## 2017-02-25 MED ORDER — CEFPROZIL 500 MG PO TABS: 500.0000 mg | ORAL_TABLET | Freq: Two times a day (BID) | ORAL | 0 refills | Status: DC

## 2017-02-25 MED ORDER — PREDNISONE 20 MG PO TABS: ORAL_TABLET | ORAL | 0 refills | Status: DC

## 2017-02-25 MED ORDER — ALBUTEROL SULFATE HFA 108 (90 BASE) MCG/ACT IN AERS: 2.0000 | INHALATION_SPRAY | RESPIRATORY_TRACT | 4 refills | Status: DC | PRN

## 2017-02-25 MED ORDER — AZITHROMYCIN 250 MG PO TABS: ORAL_TABLET | ORAL | 0 refills | Status: DC

## 2017-02-25 MED FILL — VENTOLIN HFA 90 MCG INHALER: 108 (90 BAS | 16 days supply | Qty: 18 | Fill #0

## 2017-02-25 NOTE — Telephone Encounter (Signed)
Calling to get chest x-ray results.

## 2017-02-25 NOTE — Progress Notes (Signed)
   Subjective:    Patient ID: Christopher Bowers, male    DOB: 11/15/1965, 51 y.o.   MRN: [id number]  Cough  This is a new problem. The current episode started 1 to 4 weeks ago. Associated symptoms include headaches and wheezing. Associated symptoms comments: Congestion. Treatments tried: Levaquinn    Patient states no other concerns this visit.  Viral like illness started off with some head congestion drainage coughing then chest congestion no wheezing he had Levaquin at home he took 7 days of it it did seem to give him some improvement but not a lot he relates over the past few days some intermittent coughing wheezing fevers chills his wife states that his color didn't seem to be the way it should be. In addition to this the patient also seemed to be lower energy than normal earlier today having more difficulty breathing but no longer having difficulty breathing he does not smoke does have a history of reactive airway. Review of Systems  Respiratory: Positive for cough and wheezing.   Neurological: Positive for headaches.  No vomiting diarrhea no high fever or chills currently     Objective:   Physical Exam  Does not appear toxic eardrums normal mucous membranes moist neck no adenopathy lungs are clear no crackles no respiratory distress O2 sats ration 96% heart regular without murmurs  Stat chest x-ray ordered await results    Assessment & Plan:  Acute bronchitis prolong recommend Cefzil as well as azithromycin in addition to this I recommend a chest x-ray if not showing significant signs improvement over the course of the next several days notify us go to ER if worse prednisone taper if necessary for any type of reactive airway flareup

## 2017-02-27 NOTE — Telephone Encounter (Signed)
Chest x-ray results were given to the nurse in turn was given to the patient

## 2017-03-08 ENCOUNTER — Encounter: Payer: Self-pay | Admitting: Internal Medicine

## 2017-03-08 ENCOUNTER — Ambulatory Visit (INDEPENDENT_AMBULATORY_CARE_PROVIDER_SITE_OTHER): Payer: 59 | Admitting: Internal Medicine

## 2017-03-08 VITALS — BP 116/78 | HR 80 | Ht 71.0 in | Wt 235.0 lb

## 2017-03-08 DIAGNOSIS — R05 Cough: Secondary | ICD-10-CM

## 2017-03-08 DIAGNOSIS — R058 Other specified cough: Secondary | ICD-10-CM

## 2017-03-08 MED FILL — VALSARTAN 160 MG TABS: 160 | 90 days supply | Qty: 90 | Fill #0

## 2017-03-08 NOTE — Progress Notes (Signed)
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   History of Present Illness  04/17/2013 1st [facility] office visit/ Luwanna Brossman cc persistent cough with mucus turning lighter since onset 04/09/13 and fever gone. Cough is worst at hs and in am, no sob, rigors, arthralgias or pleuritic cp. Still  Feels wheezy with nasal and throat congestion and using lots of otcs' including allegra D with elevations in BP noted.  Was very anxious re dx of ards that history was not repeating itself so self referred back to pulmonary  rec Stop the allegra D and all decongestants If still congested /wheezing > Prednisone 10 mg take  4 each am x 2 days,   2 each am x 2 days,  1 each am x 2 days and stop  In the event of any respiratory flare, take Nexium 40 mg Take 30- 60 min before your first and last meals of the day until better for a week GERD diet >>> all coughing resolved/ no sob      10/10/2014 Acute extened  ov/[name] re: recurrent wheeze/ sob on ACEi   Chief Complaint  Patient presents with  . Follow-up    Pt c/o wheezing and SOB on and off for the past few months.   around 07/01/2014 cough/ wheezing / sob recurred  and restarted nexium but no benefit and gradually worse even with saba and went to ER 4/281/6 and given even more saba but no steroids. Cough is dry and day > noct with wheezing heard across the room  >>changed ACE to diovan      10/25/2014 Acute OV NP  : Cough  Patient says for an acute office visit Patient complains of cough, wheezing for the last several  months. Is been treated with several antibiotic cyst since January of this year. He was seen 2 weeks ago with Dr. Melvyn Novas, taken off of lisinopril and change to Diovan due to suspected ace induced cough. Patient says that he continues to have some cough and wheezing that he can hear coming from his throat. He has frequent throat clearing. And feels that he has some postnasal drainage. No dyspnea.   rec Prednisone taper over next week.  Delsym 2 tsp Twice daily  For cough As needed   Begin Allegra 180mg  daily in am  Begin Chlortrimeton 4mg  2 At bedtime   Sips of water to avoid cough and throat clearing  NO MINTS   03/08/2017  Acute extended  ov/[name] re:  Refractory Cough/wheeze  Chief Complaint  Patient presents with  . Acute Visit    Pt cough and wheezing for the past month. He took a round of levaquin 2 wks ago and improved temporarily, and then had round of keflex and pred.   Jan 2018 stuffy head/ runny nose/ cough> rec levaquin by PCP > got better 100% so didn't take Then 01/2017 itchy/ sneezy/ runny nose > coughing/ wheezing and took levaquin and some better then worse 2 d after stopped levaquin so returned and got  zpak/kefzil/pred on 02/25/17 > mostly better but still clearing throat and "wheeze and congestiong" but no excess/ purulent sputum or mucus plugs  On nexium 40 mg qod/ Uses lots of mints/ Has saba and says  helps for 12 hours p taking doing in am   No obvious day to day or daytime variability or assoc excess/ purulent sputum or mucus plugs or hemoptysis or cp or chest tightness,   overt sinus or hb symptoms. No unusual exp hx or h/o childhood pna/ asthma or knowledge of premature birth.  Sleeping ok mostly nights now without nocturnal  or early am exacerbation  of respiratory  c/o's or need for noct saba. Also denies any obvious fluctuation of symptoms with weather or environmental changes or other aggravating or alleviating factors except as outlined above   Current Allergies,  Complete Past Medical History, Past Surgical History, Family History, and Social History were reviewed in [employer] record.  ROS  The following are not active complaints unless bolded Hoarseness, sore throat, dysphagia, dental problems, itching, sneezing,  nasal congestion or discharge of excess mucus or purulent secretions, ear ache,   fever, chills, sweats, unintended wt loss or wt gain, classically pleuritic or exertional cp,  orthopnea pnd or leg swelling, presyncope, palpitations, abdominal pain, anorexia, nausea, vomiting, diarrhea  or change in bowel habits or change in bladder habits, change in stools or change in urine, dysuria, hematuria,  rash, arthralgias, visual complaints, headache, numbness, weakness or ataxia or problems with walking or coordination,  change in mood/affect or memory.        Current Meds  Medication Sig  . albuterol (PROVENTIL HFA;VENTOLIN HFA) 108 (90 Base) MCG/ACT inhaler Inhale 2 puffs into the lungs every 4 (four) hours as needed for wheezing or shortness of breath.  Marland Kitchen buPROPion (WELLBUTRIN SR) 200 MG 12 hr tablet Take 1 tablet (200 mg total) by mouth 2 (two) times daily.  . Cholecalciferol (VITAMIN D3) 5000 UNITS TABS Take 5,000 Units by mouth daily.  Marland Kitchen esomeprazole (NEXIUM) 40 MG capsule TAKE 1 CAPSULE qod   . fluticasone (FLONASE) 50 MCG/ACT nasal spray Place 2 sprays into both nostrils daily.  . valsartan (DIOVAN) 160 MG tablet Take 1 tablet (160 mg total) by mouth every morning.                     Objective:   Physical Exam  10/10/2014       247>245 10/25/2014 > 03/08/2017  235     HEENT: nl dentition, turbinates, and orophanx. Nl external ear canals without cough reflex   NECK :  without JVD/Nodes/TM/ nl carotid upstrokes bilaterally   LUNGS: no acc muscle use, clear to A and P bilaterally without cough on insp or exp maneuvers psuedowheeze on forced exp / resolved with plm    CV:  RRR  no s3 or murmur or  increase in P2, no edema   ABD:  soft and nontender with nl excursion in the supine position. No bruits or organomegaly, bowel sounds nl  MS:  warm without deformities, calf tenderness, cyanosis or clubbing  SKIN: warm and dry without lesions    NEURO:  alert, approp, no deficits     I personally reviewed images and agree with radiology impression as follows:  CXR:   02/25/17 No active cardiopulmonary disease.  .     Assessment & Plan:

## 2017-03-08 NOTE — Patient Instructions (Addendum)
When cough for any reason >  nexium 40 mg Take 30- 60 min before your first and last meals of the day until all better for at least a week without the need for cough medication or albuterol    GERD (REFLUX)  is an extremely common cause of respiratory symptoms just like yours , many times with no obvious heartburn at all.    It can be treated with medication, but also with lifestyle changes including elevation of the head of your bed (ideally with 6 inch  bed blocks),  Smoking cessation, avoidance of late meals, excessive alcohol, and avoid fatty foods, chocolate, peppermint, colas, red wine, and acidic juices such as orange juice.  NO MINT OR MENTHOL PRODUCTS SO NO COUGH DROPS  USE SUGARLESS CANDY INSTEAD (Jolley ranchers or Stover's or Life Savers) or even ice chips will also do - the key is to swallow to prevent all throat clearing. NO OIL BASED VITAMINS - use powdered substitutes.   If not all  better, return with all active medications to regroup

## 2017-03-09 NOTE — Assessment & Plan Note (Addendum)
Trial off acei 10/11/2014 >>>  - Spirometry 03/08/2017  Completely nl including mid flows 8 h p last saba   - try max rx for ger for flares 03/08/2017   Acute onset flare with uri symptoms already treated with multiple abx/ steroids/saba and still having "wheezing " are all typical features of Upper airway cough syndrome (previously labeled PNDS),  is so named because it's frequently impossible to sort out how much is  CR/sinusitis with freq throat clearing (which can be related to primary GERD)   vs  causing  secondary (" extra esophageal")  GERD from wide swings in gastric pressure that occur with throat clearing, often  promoting self use of mint and menthol lozenges that reduce the lower esophageal sphincter tone and exacerbate the problem further in a cyclical fashion.   These are the same pts (now being labeled as having "irritable larynx syndrome" by some cough centers) who not infrequently have a history of having failed to tolerate ace inhibitors,  dry powder inhalers or biphosphonates or report having atypical/extraesophageal reflux symptoms that don't respond to standard doses of PPI  and are easily confused as having aecopd or asthma flares by even experienced allergists/ pulmonologists (myself included) and interestingly very common in pts who are or are acquainted with Health care providers (his wife is an RT).   Of the three most common causes of  Sub-acute or recurrent or chronic cough, only one (GERD)  can actually contribute to/ trigger  the other two (asthma and post nasal drip syndrome)  and perpetuate the cylce of cough.  While not intuitively obvious, many patients with chronic low grade reflux do not cough until there is a primary insult that disturbs the protective epithelial barrier and exposes sensitive nerve endings.   This is typically viral as was likely the case here  but can be direct physical injury such as with an endotracheal tube.   The point is that once this occurs, it is  difficult to eliminate the cycle  using anything but a maximally effective acid suppression regimen at least in the short run, accompanied by an appropriate diet to address non acid GERD and control    rec max rx for gerd, stop using mints and replace with sugarless candy/ f/u here with allergy testing next if not effective   I had an extended discussion with the patient reviewing all relevant studies completed to date and  lasting 25 minutes of a 40  minute acute  visit to re-establish  re  severe non-specific but potentially very serious refractory respiratory symptoms of uncertain and potentially multiple  etiologies.   Each maintenance medication was reviewed in detail including most importantly the difference between maintenance and prns and under what circumstances the prns are to be triggered using an action plan format that is not reflected in the computer generated alphabetically organized AVS.    Please see AVS for specific instructions unique to this office visit that I personally wrote and verbalized to the the pt in detail and then reviewed with pt  by my nurse highlighting any changes in therapy/plan of care  recommended at today's visit.

## 2017-03-21 ENCOUNTER — Telehealth: Payer: Self-pay | Admitting: Internal Medicine

## 2017-03-21 NOTE — Telephone Encounter (Signed)
Patient has been scheduled with MW tomorrow at 415. He verbalized understanding. Nothing else needed at time of call.

## 2017-03-21 NOTE — Telephone Encounter (Signed)
Ov with all active meds in hand to regroup

## 2017-03-21 NOTE — Telephone Encounter (Signed)
Spoke with pt, he stated he had bronchitis and saw MW a couple of weeks ago. He is no better from the recommendations from MW with taking a Nexium and avoiding certain foods. He states he is still coughing, wheezing and it gets worse at night. He had to sleep in the recliner last night because he was coughing so much. MW please advise on what to do next.   [phone]

## 2017-03-22 ENCOUNTER — Other Ambulatory Visit (INDEPENDENT_AMBULATORY_CARE_PROVIDER_SITE_OTHER): Payer: 59

## 2017-03-22 ENCOUNTER — Ambulatory Visit (INDEPENDENT_AMBULATORY_CARE_PROVIDER_SITE_OTHER): Payer: 59 | Admitting: Internal Medicine

## 2017-03-22 ENCOUNTER — Encounter: Payer: Self-pay | Admitting: Internal Medicine

## 2017-03-22 VITALS — BP 128/78 | HR 85 | Ht 71.0 in | Wt 234.4 lb

## 2017-03-22 DIAGNOSIS — R058 Other specified cough: Secondary | ICD-10-CM

## 2017-03-22 DIAGNOSIS — J45991 Cough variant asthma: Secondary | ICD-10-CM | POA: Diagnosis not present

## 2017-03-22 DIAGNOSIS — J4540 Moderate persistent asthma, uncomplicated: Secondary | ICD-10-CM | POA: Insufficient documentation

## 2017-03-22 DIAGNOSIS — J4541 Moderate persistent asthma with (acute) exacerbation: Secondary | ICD-10-CM | POA: Insufficient documentation

## 2017-03-22 DIAGNOSIS — R05 Cough: Secondary | ICD-10-CM | POA: Diagnosis not present

## 2017-03-22 LAB — CBC WITH DIFFERENTIAL/PLATELET
BASOS ABS: 0.1 10*3/uL (ref 0.0–0.1)
Basophils Relative: 0.9 % (ref 0.0–3.0)
EOS ABS: 1.2 10*3/uL — AB (ref 0.0–0.7)
Eosinophils Relative: 10.7 % — ABNORMAL HIGH (ref 0.0–5.0)
HCT: 48.8 % (ref 39.0–52.0)
Hemoglobin: 16.4 g/dL (ref 13.0–17.0)
LYMPHS ABS: 3.2 10*3/uL (ref 0.7–4.0)
Lymphocytes Relative: 29.1 % (ref 12.0–46.0)
MCHC: 33.6 g/dL (ref 30.0–36.0)
MCV: 93.8 fl (ref 78.0–100.0)
MONO ABS: 1 10*3/uL (ref 0.1–1.0)
Monocytes Relative: 9.6 % (ref 3.0–12.0)
NEUTROS ABS: 5.4 10*3/uL (ref 1.4–7.7)
NEUTROS PCT: 49.7 % (ref 43.0–77.0)
PLATELETS: 221 10*3/uL (ref 150.0–400.0)
RBC: 5.2 Mil/uL (ref 4.22–5.81)
RDW: 13.1 % (ref 11.5–15.5)
WBC: 10.9 10*3/uL — ABNORMAL HIGH (ref 4.0–10.5)

## 2017-03-22 LAB — NITRIC OXIDE: NITRIC OXIDE: 108

## 2017-03-22 MED ORDER — PREDNISONE 10 MG PO TABS: ORAL_TABLET | ORAL | 0 refills | Status: DC

## 2017-03-22 MED ORDER — MOMETASONE FURO-FORMOTEROL FUM 100-5 MCG/ACT IN AERO: 2.0000 | INHALATION_SPRAY | Freq: Two times a day (BID) | RESPIRATORY_TRACT | 11 refills | Status: DC

## 2017-03-22 MED ORDER — MOMETASONE FURO-FORMOTEROL FUM 100-5 MCG/ACT IN AERO: 2.0000 | INHALATION_SPRAY | Freq: Two times a day (BID) | RESPIRATORY_TRACT | 0 refills | Status: DC

## 2017-03-22 MED FILL — predniSONE 10 MG TABS: 10 | 6 days supply | Qty: 14 | Fill #0

## 2017-03-22 MED FILL — DULERA 100 MCG/5 MCG INH: 100-5 | 30 days supply | Qty: 13 | Fill #0

## 2017-03-22 NOTE — Progress Notes (Signed)
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   History of Present Illness  04/17/2013 1st [facility] office visit/ Christopher Bowers cc persistent cough with mucus turning lighter since onset 04/09/13 and fever gone. Cough is worst at hs and in am, no sob, rigors, arthralgias or pleuritic cp. Still  Feels wheezy with nasal and throat congestion and using lots of otcs' including allegra D with elevations in BP noted.  Was very anxious re dx of ards that history was not repeating itself so self referred back to pulmonary  rec Stop the allegra D and all decongestants If still congested /wheezing > Prednisone 10 mg take  4 each am x 2 days,   2 each am x 2 days,  1 each am x 2 days and stop  In the event of any respiratory flare, take Nexium 40 mg Take 30- 60 min before your first and last meals of the day until better for a week GERD diet >>> all coughing resolved/ no sob      10/10/2014 Acute extened  ov/Christopher Bowers re: recurrent wheeze/ sob on ACEi   Chief Complaint  Patient presents with  . Follow-up    Pt c/o wheezing and SOB on and off for the past few months.   around 07/01/2014 cough/ wheezing / sob recurred  and restarted nexium but no benefit and gradually worse even with saba and went to ER 4/281/6 and given even more saba but no steroids. Cough is dry and day > noct with wheezing heard across the room  >>changed ACE to diovan      10/25/2014 Acute OV Christopher Bowers  : Cough  Patient says for an acute office visit Patient complains of cough, wheezing for the last several  months. Is been treated with several antibiotic cyst since January of this year. He was seen 2 weeks ago with Christopher Bowers, taken off of lisinopril and change to Diovan due to suspected ace induced cough. Patient says that he continues to have some cough and wheezing that he can hear coming from his throat. He has frequent throat clearing. And feels that he has some postnasal drainage. No dyspnea.   rec Prednisone taper over next week.  Delsym 2 tsp Twice daily  For cough As needed   Begin Allegra 180mg  daily in am  Begin Chlortrimeton 4mg  2 At bedtime   Sips of water to avoid cough and throat clearing  NO MINTS   03/08/2017  Acute extended  ov/Christopher Bowers re:  Refractory Cough/wheeze  Chief Complaint  Patient presents with  . Acute Visit    Pt cough and wheezing for the past month. He took a round of levaquin 2 wks ago and improved temporarily, and then had round of keflex and pred.   Jan 2018 stuffy head/ runny nose/ cough> rec levaquin by PCP > got better 100% so didn't take Then 01/2017 itchy/ sneezy/ runny nose > coughing/ wheezing and took levaquin and some better then worse 2 d after stopped levaquin so returned and got  zpak/kefzil/pred on 02/25/17 > mostly better but still clearing throat and "wheeze and congested" but no excess/ purulent sputum or mucus plugs  On nexium 40 mg qod/ Uses lots of mints/ Has saba and says  helps for 12 hours p taking doing in am rec  When cough for any reason >  nexium 40 mg Take 30- 60 min before your first and last meals of the day until all better for at least a week without the need for cough medication or albuterol  GERD diet  If not all  better, return with all active medications to regroup    03/22/2017 acute extended ov/Christopher Bowers re: refractory cough now also with active wheeze Chief Complaint  Patient presents with  . Acute Visit    cough is not getting better, patient cannot sleep or lay down flat due to coughing. patient coughed so much he blacked out  and hit his head.   last alb 10 h prior to OV   Now waking up 3 am with cough/ congestion/ sense of chest tightness and wheeze improves some with saba but only for a few hours despite rx with ppi bid   No obvious day to day or daytime variability or assoc excess/ purulent sputum or mucus plugs or hemoptysis or cp or or overt sinus or hb symptoms. No unusual exp hx or h/o childhood pna/ asthma or knowledge of premature birth.    Also denies any obvious fluctuation of symptoms with weather or environmental changes or other aggravating or alleviating factors except as outlined above   Current Allergies, Complete Past Medical History, Past Surgical History, Family History, and Social History were reviewed in [employer] record.  ROS  The following are not active complaints unless bolded Hoarseness, sore throat, dysphagia, dental problems, itching, sneezing,  nasal congestion or discharge of excess mucus or purulent secretions, ear ache,   fever, chills, sweats, unintended wt loss or wt gain, classically pleuritic or exertional cp,  orthopnea pnd or leg swelling, presyncope, palpitations, abdominal pain, anorexia, nausea, vomiting, diarrhea  or change in bowel habits or change in bladder habits, change in stools or change in urine, dysuria, hematuria,  rash, arthralgias, visual complaints, headache, numbness, weakness or ataxia or problems with walking or coordination,  change in mood/affect or memory.        Current Meds  Medication Sig  . albuterol (PROVENTIL HFA;VENTOLIN HFA) 108 (90 Base) MCG/ACT inhaler Inhale 2 puffs into the lungs every 4 (four) hours as needed for wheezing or shortness of breath.  Marland Kitchen buPROPion (WELLBUTRIN SR) 200 MG 12 hr tablet Take 1 tablet (200 mg total) by mouth 2 (two) times daily.  . Cholecalciferol (VITAMIN D3) 5000 UNITS TABS Take 5,000 Units by mouth daily.  Marland Kitchen esomeprazole (NEXIUM) 40 MG capsule TAKE 1 CAPSULE BY MOUTH TWICE DAILY BEFORE MEALS  .  fluticasone (FLONASE) 50 MCG/ACT nasal spray Place 2 sprays into both nostrils daily.  . valsartan (DIOVAN) 160 MG tablet Take 1 tablet (160 mg total) by mouth every morning.                Objective:   Physical Exam  10/10/2014       247>245 10/25/2014 > 03/08/2017  235  > 03/22/2017    234   Vital signs reviewed  - - Note on arrival 02 sats  96% on RA  amb pleasant wm nad     HEENT: nl dentition, turbinates, and orophanx. Nl external ear canals without cough reflex  NECK :  without JVD/Nodes/TM/ nl carotid upstrokes bilaterally   LUNGS: no acc muscle use, clear to A and P bilaterally with insp pops/ squeaks and pan exp wheeze     CV:  RRR  no s3 or murmur or increase in P2, no edema   ABD:  soft and nontender with nl excursion in the supine position. No bruits or organomegaly, bowel sounds nl  MS:  warm without deformities, calf tenderness, cyanosis or clubbing  SKIN: warm and dry without lesions    NEURO:  alert, approp, no deficits     I personally reviewed images and agree with radiology impression as follows:  CXR:   02/25/17 No active cardiopulmonary disease.   Labs ordered 03/22/2017  Allergy profile   .     Assessment & Plan:

## 2017-03-22 NOTE — Patient Instructions (Signed)
Plan A = Automatic = dulera 100  Up to 2 puffs every 12 hours   Work on inhaler technique:  relax and gently blow all the way out then take a nice smooth deep breath back in, triggering the inhaler at same time you start breathing in.  Hold for up to 5 seconds if you can. Blow out thru nose. Rinse and gargle with water when done     Plan B = Backup Only use your albuterol as a rescue medication to be used if you can't catch your breath by resting or doing a relaxed purse lip breathing pattern.  - The less you use it, the better it will work when you need it. - Ok to use the inhaler up to 2 puffs  every 4 hours if you must but call for appointment if use goes up over your usual need - Don't leave home without it !!  (think of it like the spare tire for your car)     Prednisone 10 mg take  4 each am x 2 days,   2 each am x 2 days,  1 each am x 2 days and stop   Continue nexium 40 mg Take 30- 60 min before your first and last meals of the day as long as you have any cough or sense of congestion at all or even   urge to clear your throat   GERD (REFLUX)  is an extremely common cause of respiratory symptoms just like yours , many times with no obvious heartburn at all.    It can be treated with medication, but also with lifestyle changes including elevation of the head of your bed (ideally with 6 inch  bed blocks),  Smoking cessation, avoidance of late meals, excessive alcohol, and avoid fatty foods, chocolate, peppermint, colas, red wine, and acidic juices such as orange juice.  NO MINT OR MENTHOL PRODUCTS SO NO COUGH DROPS   USE SUGARLESS CANDY INSTEAD (Jolley ranchers or Stover's or Life Savers) or even ice chips will also do - the key is to swallow to prevent all throat clearing. NO OIL BASED VITAMINS - use powdered substitutes.     Please remember to go to the  lab department downstairs in the basement  for your tests - we will call you with the results when they are available.        Follow up is as needed

## 2017-03-23 ENCOUNTER — Encounter: Payer: Self-pay | Admitting: Internal Medicine

## 2017-03-23 LAB — RESPIRATORY ALLERGY PROFILE REGION II ~~LOC~~
ALLERGEN, D PTERNOYSSINUS, D1: 0.17 kU/L — AB
Allergen, A. alternata, m6: <0.1 kU/L
Allergen, Cedar tree, t12: <0.1 kU/L
Allergen, Comm Silver Birch, t9: <0.1 kU/L
Allergen, Cottonwood, t14: <0.1 kU/L
Allergen, P. notatum, m1: <0.1 kU/L
Aspergillus fumigatus, m3: <0.1 kU/L
Bermuda Grass: <0.1 kU/L
CLADOSPORIUM HERBARUM (M2) IGE: <0.1 kU/L
CLASS: 0
CLASS: 0
CLASS: 0
CLASS: 0
CLASS: 0
CLASS: 0
COCKROACH: 1.34 kU/L — AB
COMMON RAGWEED (SHORT) (W1) IGE: 1.75 kU/L — ABNORMAL HIGH
Cat Dander: <0.1 kU/L
Class: 0
Class: 0
Class: 0
Class: 0
Class: 0
Class: 0
Class: 0
Class: 0
Class: 0
Class: 0
Class: 0
Class: 0
Class: 0
Class: 0
Class: 0
Class: 2
Class: 2
Class: 2
D. FARINAE: 0.22 kU/L — AB
Dog Dander: <0.1 kU/L
IgE (Immunoglobulin E), Serum: 42 kU/L (ref <=114)
Pecan/Hickory Tree IgE: <0.1 kU/L
TIMOTHY GRASS: 1.25 kU/L — AB

## 2017-03-23 LAB — INTERPRETATION:

## 2017-03-23 NOTE — Assessment & Plan Note (Signed)
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 Refer to allergy if pos for atopy and becomes chronically difficult to control    I had an extended discussion with the patient reviewing all relevant studies completed to date and  lasting 25 minutes of a 40  minute acute visit addressing  severe non-specific but potentially very serious refractory respiratory symptoms of uncertain and potentially multiple  etiologies.   Each maintenance medication was reviewed in detail including most importantly the difference between maintenance and prns and under what circumstances the prns are to be triggered using an action plan format that is not reflected in the computer generated alphabetically organized AVS.    Please see AVS for specific instructions unique to this office visit that I personally wrote and verbalized to the the pt in detail and then reviewed with pt  by my nurse highlighting any changes in therapy/plan of care  recommended at today's visit.

## 2017-03-23 NOTE — Progress Notes (Signed)
LMTCB

## 2017-03-23 NOTE — Assessment & Plan Note (Signed)
Trial off acei 10/11/2014 >>>  - Spirometry 03/08/2017  Completely nl including mid flows 8 h p last saba   - try max rx for ger for flares 03/08/2017  - Spirometry 03/22/2017  Again no airflow obst despite active wheeze  Next step in w/u is check allergy profile w/a and consider sinus ct / trial of gabapentin if persists  see avs for instructions unique to this ov

## 2017-03-25 NOTE — Progress Notes (Signed)
LMTCB

## 2017-03-31 NOTE — Progress Notes (Signed)
LMTCB

## 2017-04-01 ENCOUNTER — Encounter: Payer: Self-pay | Admitting: Internal Medicine

## 2017-04-01 NOTE — Progress Notes (Signed)
Letter mailed

## 2017-04-13 ENCOUNTER — Telehealth: Payer: Self-pay | Admitting: Internal Medicine

## 2017-04-13 NOTE — Telephone Encounter (Signed)
Notes recorded by Tanda Rockers, MD on 03/23/2017 at 4:38 PM EDT Call patient : Studies are c/w moderate allergy ragweed > roach > grass > dust which would fit with present flare in ragweed season but no change recs  Advised pt of results. Pt understood and nothing further is needed.

## 2017-05-04 DIAGNOSIS — M9901 Segmental and somatic dysfunction of cervical region: Secondary | ICD-10-CM | POA: Diagnosis not present

## 2017-05-04 DIAGNOSIS — M9902 Segmental and somatic dysfunction of thoracic region: Secondary | ICD-10-CM | POA: Diagnosis not present

## 2017-05-04 DIAGNOSIS — M9903 Segmental and somatic dysfunction of lumbar region: Secondary | ICD-10-CM | POA: Diagnosis not present

## 2017-05-30 MED FILL — VENTOLIN HFA 90 MCG INHALER: 108 (90 BAS | 16 days supply | Qty: 18 | Fill #1

## 2017-05-30 MED FILL — FLUTICASONE PROP 50 MCG SPR: 50 | 30 days supply | Qty: 16 | Fill #1

## 2017-05-30 MED FILL — VALSARTAN 160 MG TABS: 160 | 90 days supply | Qty: 90 | Fill #1

## 2017-05-30 MED FILL — BUPROPION HCL SR 200 MG TAB: 200 | 90 days supply | Qty: 180 | Fill #1

## 2017-05-30 MED FILL — ESOMEPRAZOLE MAG DR 40 MG C: 40 | 90 days supply | Qty: 180 | Fill #1

## 2017-05-31 ENCOUNTER — Telehealth: Payer: 59 | Admitting: Family

## 2017-05-31 DIAGNOSIS — R05 Cough: Secondary | ICD-10-CM

## 2017-05-31 DIAGNOSIS — R059 Cough, unspecified: Secondary | ICD-10-CM

## 2017-05-31 MED ORDER — PREDNISONE 10 MG (21) PO TBPK: ORAL_TABLET | ORAL | 0 refills | Status: DC

## 2017-05-31 MED ORDER — LEVOFLOXACIN 500 MG PO TABS: 500.0000 mg | ORAL_TABLET | Freq: Every day | ORAL | 0 refills | Status: DC

## 2017-05-31 NOTE — Progress Notes (Signed)

## 2017-07-27 ENCOUNTER — Telehealth: Payer: Self-pay | Admitting: Family Medicine

## 2017-07-27 NOTE — Telephone Encounter (Signed)
Has had lipid,liver,glucose,A1c,Cbc,01/25/2017

## 2017-07-27 NOTE — Telephone Encounter (Signed)
Patient is seeing Dr. Richardson Landry on 08/02/17.  He is requesting orders for labs.

## 2017-07-28 ENCOUNTER — Other Ambulatory Visit: Payer: Self-pay | Admitting: Family Medicine

## 2017-07-28 DIAGNOSIS — E785 Hyperlipidemia, unspecified: Secondary | ICD-10-CM

## 2017-07-28 DIAGNOSIS — R748 Abnormal levels of other serum enzymes: Secondary | ICD-10-CM

## 2017-07-28 NOTE — Telephone Encounter (Signed)
Entered labs into Epic; called pts preferred number and left message for him to return call

## 2017-07-28 NOTE — Telephone Encounter (Signed)
Same  Plus met 7 and psa, go ahead and delete glu cause will get on met 7

## 2017-07-28 NOTE — Telephone Encounter (Signed)
Patient is aware 

## 2017-07-29 ENCOUNTER — Other Ambulatory Visit: Payer: Self-pay

## 2017-07-29 DIAGNOSIS — R748 Abnormal levels of other serum enzymes: Secondary | ICD-10-CM

## 2017-07-29 DIAGNOSIS — R5383 Other fatigue: Secondary | ICD-10-CM

## 2017-07-29 DIAGNOSIS — Z125 Encounter for screening for malignant neoplasm of prostate: Secondary | ICD-10-CM

## 2017-07-29 DIAGNOSIS — R7309 Other abnormal glucose: Secondary | ICD-10-CM

## 2017-07-29 DIAGNOSIS — E785 Hyperlipidemia, unspecified: Secondary | ICD-10-CM

## 2017-07-29 DIAGNOSIS — I10 Essential (primary) hypertension: Secondary | ICD-10-CM | POA: Diagnosis not present

## 2017-07-30 LAB — BASIC METABOLIC PANEL
BUN/Creatinine Ratio: 25 — ABNORMAL HIGH (ref 9–20)
BUN: 25 mg/dL — ABNORMAL HIGH (ref 6–24)
CO2: 22 mmol/L (ref 20–29)
Calcium: 9.3 mg/dL (ref 8.7–10.2)
Chloride: 106 mmol/L (ref 96–106)
Creatinine, Ser: 1 mg/dL (ref 0.76–1.27)
GFR calc Af Amer: 100 mL/min/{1.73_m2} (ref >59)
GFR, EST NON AFRICAN AMERICAN: 87 mL/min/{1.73_m2} (ref >59)
Glucose: 109 mg/dL — ABNORMAL HIGH (ref 65–99)
Potassium: 5 mmol/L (ref 3.5–5.2)
SODIUM: 142 mmol/L (ref 134–144)

## 2017-07-30 LAB — PSA: Prostate Specific Ag, Serum: 0.8 ng/mL (ref 0.0–4.0)

## 2017-07-30 LAB — CBC WITH DIFFERENTIAL/PLATELET
Basophils Absolute: 0 10*3/uL (ref 0.0–0.2)
Basos: 0 %
EOS (ABSOLUTE): 0.2 10*3/uL (ref 0.0–0.4)
Eos: 2 %
Hematocrit: 46 % (ref 37.5–51.0)
Hemoglobin: 14.9 g/dL (ref 13.0–17.7)
IMMATURE GRANULOCYTES: 0 %
Immature Grans (Abs): 0 10*3/uL (ref 0.0–0.1)
Lymphocytes Absolute: 2.4 10*3/uL (ref 0.7–3.1)
Lymphs: 31 %
MCH: 31.4 pg (ref 26.6–33.0)
MCHC: 32.4 g/dL (ref 31.5–35.7)
MCV: 97 fL (ref 79–97)
MONOS ABS: 0.9 10*3/uL (ref 0.1–0.9)
Monocytes: 11 %
NEUTROS PCT: 56 %
Neutrophils Absolute: 4.4 10*3/uL (ref 1.4–7.0)
PLATELETS: 228 10*3/uL (ref 150–379)
RBC: 4.74 x10E6/uL (ref 4.14–5.80)
RDW: 13.2 % (ref 12.3–15.4)
WBC: 7.9 10*3/uL (ref 3.4–10.8)

## 2017-07-30 LAB — LIPID PANEL
CHOLESTEROL TOTAL: 185 mg/dL (ref 100–199)
Chol/HDL Ratio: 4.1 ratio (ref 0.0–5.0)
HDL: 45 mg/dL (ref >39)
LDL CALC: 119 mg/dL — AB (ref 0–99)
Triglycerides: 107 mg/dL (ref 0–149)
VLDL Cholesterol Cal: 21 mg/dL (ref 5–40)

## 2017-07-30 LAB — HEMOGLOBIN A1C
ESTIMATED AVERAGE GLUCOSE: 117 mg/dL
Hgb A1c MFr Bld: 5.7 % — ABNORMAL HIGH (ref 4.8–5.6)

## 2017-07-30 LAB — HEPATIC FUNCTION PANEL
ALT: 34 IU/L (ref 0–44)
AST: 26 IU/L (ref 0–40)
Albumin: 4.7 g/dL (ref 3.5–5.5)
Alkaline Phosphatase: 54 IU/L (ref 39–117)
Bilirubin Total: 0.2 mg/dL (ref 0.0–1.2)
Bilirubin, Direct: 0.09 mg/dL (ref 0.00–0.40)
Total Protein: 6.7 g/dL (ref 6.0–8.5)

## 2017-08-02 ENCOUNTER — Encounter: Payer: 59 | Admitting: Family Medicine

## 2017-08-04 ENCOUNTER — Encounter: Payer: Self-pay | Admitting: Family Medicine

## 2017-08-04 ENCOUNTER — Ambulatory Visit: Payer: 59 | Admitting: Family Medicine

## 2017-08-04 VITALS — BP 132/88 | Ht 71.0 in | Wt 241.0 lb

## 2017-08-04 DIAGNOSIS — R7301 Impaired fasting glucose: Secondary | ICD-10-CM

## 2017-08-04 DIAGNOSIS — I10 Essential (primary) hypertension: Secondary | ICD-10-CM | POA: Diagnosis not present

## 2017-08-04 DIAGNOSIS — Z79899 Other long term (current) drug therapy: Secondary | ICD-10-CM | POA: Diagnosis not present

## 2017-08-04 DIAGNOSIS — Z0001 Encounter for general adult medical examination with abnormal findings: Secondary | ICD-10-CM

## 2017-08-04 DIAGNOSIS — Z1211 Encounter for screening for malignant neoplasm of colon: Secondary | ICD-10-CM | POA: Diagnosis not present

## 2017-08-04 MED ORDER — ESOMEPRAZOLE MAGNESIUM 40 MG PO CPDR: DELAYED_RELEASE_CAPSULE | ORAL | 1 refills | Status: DC

## 2017-08-04 MED ORDER — FLUTICASONE PROPIONATE 50 MCG/ACT NA SUSP: 2.0000 | Freq: Every day | NASAL | 6 refills | Status: DC

## 2017-08-04 MED ORDER — BUPROPION HCL ER (SR) 200 MG PO TB12: 200.0000 mg | ORAL_TABLET | Freq: Two times a day (BID) | ORAL | 1 refills | Status: DC

## 2017-08-04 MED ORDER — MOMETASONE FURO-FORMOTEROL FUM 100-5 MCG/ACT IN AERO: 2.0000 | INHALATION_SPRAY | Freq: Two times a day (BID) | RESPIRATORY_TRACT | 11 refills | Status: DC

## 2017-08-04 MED ORDER — VALSARTAN 160 MG PO TABS: 160.0000 mg | ORAL_TABLET | Freq: Every morning | ORAL | 1 refills | Status: DC

## 2017-08-04 MED FILL — FLUTICASONE PROP 50 MCG SPR: 50 | 30 days supply | Qty: 16 | Fill #0

## 2017-08-04 NOTE — Progress Notes (Signed)
Subjective:    Patient ID: Christopher Bowers, male    DOB: 29-Mar-1966, 52 y.o.   MRN: [id number]  HPI The patient comes in today for a wellness visit.    A review of their health history was completed.  A review of medications was also completed.  Any needed refills; no  Eating habits: excellent; tries to be health conscious   Falls/  MVA accidents in past few months: no  Regular exercise: walks   Specialist pt sees on regular basis: Dr. Melvyn Novas at [place] were discussed.   Additional concerns: Questions about diet; has been doing Keto diet for about 14 months Blood pressure medicine and blood pressure levels reviewed today with patient. Compliant with blood pressure medicine. States does not miss a dose. No obvious side effects. Blood pressure generally good when checked elsewhere. Watching salt intake.   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 %   Lymphs 31 Not Estab. %   Monocytes 11 Not Estab. %   Eos 2 Not Estab. %   Basos 0 Not Estab. %   Neutrophils Absolute 4.4 1.4 - 7.0 x10E3/uL   Lymphocytes Absolute 2.4 0.7 - 3.1 x10E3/uL   Monocytes Absolute 0.9 0.1 - 0.9 x10E3/uL   EOS (ABSOLUTE) 0.2 0.0 - 0.4 x10E3/uL   Basophils Absolute 0.0 0.0 - 0.2 x10E3/uL   Immature Granulocytes 0 Not Estab. %   Immature Grans (Abs) 0.0 0.0 - 0.1 x10E3/uL  Hemoglobin A1c  Result Value Ref Range   Hgb A1c MFr Bld 5.7 (H) 4.8 - 5.6 %   Est. average glucose Bld gHb Est-mCnc 117 mg/dL  Lipid panel  Result Value Ref Range   Cholesterol, Total 185 100 - 199 mg/dL   Triglycerides 107 0 - 149 mg/dL   HDL 45 >39 mg/dL   VLDL Cholesterol Cal 21 5 - 40 mg/dL   LDL Calculated 119 (H) 0 - 99 mg/dL   Chol/HDL Ratio 4.1 0.0 - 5.0 ratio  Hepatic function panel  Result Value Ref Range   Total Protein 6.7 6.0 - 8.5 g/dL   Albumin 4.7 3.5 - 5.5 g/dL   Bilirubin Total 0.2 0.0 - 1.2 mg/dL   Bilirubin, Direct 0.09 0.00 - 0.40 mg/dL   Alkaline Phosphatase 54 39 - 117 IU/L   AST 26 0 - 40 IU/L   ALT 34 0 - 44 IU/L  PSA  Result Value Ref Range   Prostate Specific Ag, Serum 0.8 0.0 - 4.0 ng/mL  Basic metabolic panel  Result Value Ref Range   Glucose 109 (H) 65 - 99 mg/dL   BUN 25 (H) 6 - 24 mg/dL   Creatinine, Ser 1.00 0.76 - 1.27 mg/dL   GFR calc non Af Amer 87 >59 mL/min/1.73   GFR calc Af Amer 100 >59 mL/min/1.73   BUN/Creatinine Ratio 25 (H) 9 - 20   Sodium  142 134 - 144 mmol/L   Potassium 5.0 3.5 - 5.2 mmol/L   Chloride 106 96 - 106 mmol/L   CO2 22 20 - 29 mmol/L   Calcium 9.3 8.7 - 10.2 mg/dL      Patient notes ongoing compliance with antidepressant medication. No obvious side effects. Reports does not miss a dose. Overall continues to help depression substantially. No thoughts of homicide or suicide. Would like to maintain medication.    Review of Systems  Constitutional: Negative for activity change, appetite change and fever.  HENT: Negative for congestion and rhinorrhea.   Eyes: Negative for discharge.  Respiratory: Negative for cough and wheezing.   Cardiovascular: Negative for chest pain.  Gastrointestinal: Negative for abdominal pain, blood in stool and vomiting.  Genitourinary: Negative for difficulty urinating and frequency.  Musculoskeletal: Negative for neck pain.  Skin: Negative for rash.  Allergic/Immunologic: Negative for environmental allergies and food allergies.  Neurological: Negative for weakness and headaches.  Psychiatric/Behavioral: Negative for agitation.  All other systems  reviewed and are negative.      Objective:   Physical Exam  Constitutional: He appears well-developed and well-nourished.  HENT:  Head: Normocephalic and atraumatic.  Right Ear: External ear normal.  Left Ear: External ear normal.  Nose: Nose normal.  Mouth/Throat: Oropharynx is clear and moist.  Eyes: Right eye exhibits no discharge. Left eye exhibits no discharge. No scleral icterus.  Neck: Normal range of motion. Neck supple. No thyromegaly present.  Cardiovascular: Normal rate, regular rhythm and normal heart sounds.  No murmur heard. Pulmonary/Chest: Effort normal and breath sounds normal. No respiratory distress. He has no wheezes.  Abdominal: Soft. Bowel sounds are normal. He exhibits no distension and no mass. There is no tenderness.  Genitourinary: Penis normal.  Genitourinary Comments: Prostate within normal limits  Musculoskeletal: Normal range of motion. He exhibits no edema.  Lymphadenopathy:    He has no cervical adenopathy.  Neurological: He is alert. He exhibits normal muscle tone. Coordination normal.  Skin: Skin is warm and dry. No erythema.  Psychiatric: He has a normal mood and affect. His behavior is normal. Judgment normal.  Vitals reviewed.         Assessment & Plan:  Impression 1 wellness exam.  Weight on colonoscopy.  Discussion L patient to agree.  Minimal exercise patient to work hard on it.  Diet discussed.  Anticipatory guidance given.  2.  Hypertension good control discussed maintain same meds compliance discussed  3.  Chronic depression ongoing benefit from meds would like to maintain discussed  4.  Prediabetes.  Long discussion L.  At least half a dozen questions regarding results discussed.  Diet discussed exercise discussed.  Dietary adjustments discussed  5.  Laboratory concerns.  Patient was concerned about a number of numbers overall just a sliver.  These are discussed at length  6.  Hyperlipidemia numbers decent reviewed patient on  ketone diet is contributing discussed  Follow-up in 6 months

## 2017-08-05 ENCOUNTER — Encounter: Payer: Self-pay | Admitting: Family Medicine

## 2017-08-10 MED FILL — VALSARTAN 160 MG TABS: 160 | 90 days supply | Qty: 90 | Fill #0

## 2017-08-12 MED FILL — DULERA 100 MCG/5 MCG INH: 100-5 | 30 days supply | Qty: 13 | Fill #0

## 2017-08-15 ENCOUNTER — Encounter: Payer: Self-pay | Admitting: Internal Medicine

## 2017-08-25 MED FILL — BUPROPION HCL SR 200 MG TAB: 200 | 90 days supply | Qty: 180 | Fill #0

## 2017-09-15 ENCOUNTER — Ambulatory Visit (INDEPENDENT_AMBULATORY_CARE_PROVIDER_SITE_OTHER): Payer: Self-pay

## 2017-09-15 DIAGNOSIS — Z1211 Encounter for screening for malignant neoplasm of colon: Secondary | ICD-10-CM

## 2017-09-15 MED ORDER — CLENPIQ 10-3.5-12 MG-GM -GM/160ML PO SOLN: 1.0000 | Freq: Once | ORAL | 0 refills | Status: AC

## 2017-09-15 NOTE — Progress Notes (Signed)
Gastroenterology Pre-Procedure Review  Request Date:09/15/17 Requesting Physician: Dr.Luking (no previous tcs)  PATIENT REVIEW QUESTIONS: The patient responded to the following health history questions as indicated:    1. Diabetes Melitis: no 2. Joint replacements in the past 12 months: no 3. Major health problems in the past 3 months: no 4. Has an artificial valve or MVP: no 5. Has a defibrillator: no 6. Has been advised in past to take antibiotics in advance of a procedure like teeth cleaning: no 7. Family history of colon cancer: no  8. Alcohol Use: yes (daily 2-3 ) 9. History of sleep apnea: yes (no cpap but pt has an appliance that he uses- they will bring it with them to the procedure- pts wife wanted it noted that he had major complications from the flu in 2014 where he ended up on life support-- he has a difficult airway 10. History of coronary artery or other vascular stents placed within the last 12 months: no 11. History of any prior anesthesia complications: no    MEDICATIONS & ALLERGIES:    Patient reports the following regarding taking any blood thinners:   Plavix? no Aspirin? no Coumadin? no Brilinta? no Xarelto? no Eliquis? no Pradaxa? no Savaysa? no Effient? no  Patient confirms/reports the following medications:  Current Outpatient Medications  Medication Sig Dispense Refill  . albuterol (PROVENTIL HFA;VENTOLIN HFA) 108 (90 Base) MCG/ACT inhaler Inhale 2 puffs into the lungs every 4 (four) hours as needed for wheezing or shortness of breath. 1 Inhaler 4  . buPROPion (WELLBUTRIN SR) 200 MG 12 hr tablet Take 1 tablet (200 mg total) by mouth 2 (two) times daily. 180 tablet 1  . Cholecalciferol (VITAMIN D3) 5000 UNITS TABS Take 5,000 Units by mouth daily.    Marland Kitchen esomeprazole (NEXIUM) 40 MG capsule TAKE 1 CAPSULE BY MOUTH TWICE DAILY BEFORE MEALS 180 capsule 1  . fluticasone (FLONASE) 50 MCG/ACT nasal spray Place 2 sprays into both nostrils daily. 16 g 6  .  mometasone-formoterol (DULERA) 100-5 MCG/ACT AERO Inhale 2 puffs into the lungs 2 (two) times daily. 1 Inhaler 11  . valsartan (DIOVAN) 160 MG tablet Take 1 tablet (160 mg total) by mouth every morning. 90 tablet 1   No current facility-administered medications for this visit.     Patient confirms/reports the following allergies:  Allergies  Allergen Reactions  . Augmentin [Amoxicillin-Pot Clavulanate] Rash    No orders of the defined types were placed in this encounter.   AUTHORIZATION INFORMATION Primary Insurance: [place],  [place] #: [id number] Pre-Cert / Josem Kaufmann required:  Pre-Cert / Josem Kaufmann #:    SCHEDULE INFORMATION: Procedure has been scheduled as follows:  Date: 12/14/17, Time: 9:15am  Location: [facility] Dr.Rourk  This Gastroenterology Pre-Precedure Review Form is being routed to the following provider(s): Walden Field NP

## 2017-09-15 NOTE — Progress Notes (Signed)
On Wellbutrin and ETOH 2-3/day. Will need OV for propofol.

## 2017-09-15 NOTE — Patient Instructions (Signed)
[facility] INSTRUCTIONS   Patient Name:  Christopher Bowers Date of procedure:  12/14/17 Time to register at [place] Stay:  8:15am Provider:  Dr. Gala Romney   Please notify us immediately if you are diabetic, take iron supplements, or if you are on coumadin or any blood thinners.    Note: Do NOT refrigerate or freeze CLENPIQ. CLENPIQ is ready to drink. There is no need to add any other liquid or mix the medicine in the bottle before you start dosing.   12/13/17  1 Day prior to procedure:     CLEAR LIQUIDS ALL DAY--NO SOLID FOODS OR DAIRY PRODUCTS! See list of liquids that are allowed and items that are NOT allowed below.     You must drink plenty of CLEAR LIQUIDS starting before your bowel prep. It is important to stay adequately hydrated before, during, and after your bowel prep for the prep to work effectively!    At 5:00 PM Begin the prep as follows:    1. Drink one bottle of premixed CLENPIQ right from the bottle. 2. Drink at least five (5) 8-ounce drinks of clear liquids of your choice within the next 5 hours   Continue clear liquids.    12/14/17  Day of Procedure   You may take TYLENOL products. Please continue your regular medications unless we have instructed you otherwise.    5 hours before procedure @ 4:15am:  1. Drink second bottle of premixed CLENPIQ right from the bottle.   2. Drink at least three (3) 8-ounce drinks of clear liquids of your choice within the next 2 hours. You can drink more if needed.   3 hours before your procedure time @ 6:15 am: Stop drinking all liquids, nothing by mouth at this point.  Please note, on the day of your procedure you MUST be accompanied by an adult who is willing to assume responsibility for you at time of discharge. If you do not have such person with you, your procedure will have to be rescheduled.                                                                                                                      Please leave ALL jewelry at home prior to coming to the hospital for your procedure.   *It is your responsibility to check with your insurance company for the benefits of coverage you have for this procedure. Unfortunately, not all insurance companies have benefits to cover all or part of these types of procedures. It is your responsibility to check your benefits, however we will be glad to assist you with any codes your insurance company may need.   Please note that most insurance companies will not cover a screening colonoscopy for people under the age of 70  For example, with some insurance companies you may have benefits for a screening colonoscopy, but if polyps are found the diagnosis will change and then you may have a deductible that will need to  be met. Please make sure you check your benefits for screening colonoscopy as well as a diagnostic colonoscopy.   CLEAR LIQUIDS: (NO RED or PURPLE) Water  Jello   Apple Juice  White Grape Juice   Kool-Aid Soft drinks  Banana popsicles Sports Drink  Black coffee (No cream or milk) Tea (No cream or milk)  Broth (fat free beef/chicken/vegetable)  Clear liquids allow you to see your fingers on the other side of the glass.  Be sure they are NOT RED or PURPLE in color, cloudy, but CLEAR.  Do Not Eat: Dairy products of any kind Cranberry juice Tomato or V8 Juice  Orange Juice   Grapefruit Juice Red Grape Juice Alcohol   Non-dairy creamer Solid foods like cereal, oatmeal, yogurt, fruits, vegetables, creamed soups, eggs, bread, etc   HELPFUL HINTS TO MAKE DRINKING EASIER: -Trying drinking through a straw. -If you become nauseated, try consuming smaller amounts or stretch out the time between glasses.  Stop for 30 minutes & slowly start back drinking.  Call our office with any questions or concerns at [phone].  Thank You,

## 2017-09-19 NOTE — Progress Notes (Signed)
Please schedule ov- I will mail him a letter

## 2017-09-20 NOTE — Progress Notes (Signed)
Letter mailed to the pt. appt scheduled for 12/15/17 at 8:30 with EG.

## 2017-10-07 MED FILL — CLENPIQ 10-3.5-12 MG-GM -GM: 10-3.5-12 M | 1 days supply | Qty: 320 | Fill #0

## 2017-10-14 MED FILL — FLUTICASONE PROP 50 MCG SPR: 50 | 30 days supply | Qty: 16 | Fill #1

## 2017-11-03 ENCOUNTER — Encounter: Payer: Self-pay | Admitting: Nurse Practitioner

## 2017-11-25 MED FILL — BUPROPION HCL SR 200 MG TAB: 200 | 90 days supply | Qty: 180 | Fill #0

## 2017-11-25 MED FILL — VALSARTAN 160 MG TABS: 160 | 90 days supply | Qty: 90 | Fill #1

## 2017-12-05 ENCOUNTER — Ambulatory Visit: Payer: 59 | Admitting: Nurse Practitioner

## 2017-12-06 ENCOUNTER — Ambulatory Visit: Payer: 59 | Admitting: Nurse Practitioner

## 2018-01-09 MED FILL — DULERA 100 MCG/5 MCG INH: 100-5 | 30 days supply | Qty: 13 | Fill #1

## 2018-01-24 DIAGNOSIS — E785 Hyperlipidemia, unspecified: Secondary | ICD-10-CM | POA: Diagnosis not present

## 2018-01-24 DIAGNOSIS — R748 Abnormal levels of other serum enzymes: Secondary | ICD-10-CM | POA: Diagnosis not present

## 2018-01-24 DIAGNOSIS — Z79899 Other long term (current) drug therapy: Secondary | ICD-10-CM | POA: Diagnosis not present

## 2018-01-24 DIAGNOSIS — Z0001 Encounter for general adult medical examination with abnormal findings: Secondary | ICD-10-CM | POA: Diagnosis not present

## 2018-01-25 LAB — HEPATIC FUNCTION PANEL
ALBUMIN: 4.9 g/dL (ref 3.5–5.5)
ALK PHOS: 52 IU/L (ref 39–117)
ALT: 34 IU/L (ref 0–44)
AST: 27 IU/L (ref 0–40)
BILIRUBIN, DIRECT: 0.17 mg/dL (ref 0.00–0.40)
Bilirubin Total: 0.5 mg/dL (ref 0.0–1.2)
TOTAL PROTEIN: 6.7 g/dL (ref 6.0–8.5)

## 2018-01-25 LAB — LIPID PANEL
CHOLESTEROL TOTAL: 216 mg/dL — AB (ref 100–199)
Chol/HDL Ratio: 4.2 ratio (ref 0.0–5.0)
HDL: 51 mg/dL (ref >39)
LDL Calculated: 139 mg/dL — ABNORMAL HIGH (ref 0–99)
Triglycerides: 132 mg/dL (ref 0–149)
VLDL Cholesterol Cal: 26 mg/dL (ref 5–40)

## 2018-01-25 LAB — HEMOGLOBIN A1C
Est. average glucose Bld gHb Est-mCnc: 114 mg/dL
Hgb A1c MFr Bld: 5.6 % (ref 4.8–5.6)

## 2018-01-25 LAB — PSA: PROSTATE SPECIFIC AG, SERUM: 1 ng/mL (ref 0.0–4.0)

## 2018-01-25 LAB — CBC
HEMOGLOBIN: 15.4 g/dL (ref 13.0–17.7)
Hematocrit: 45.6 % (ref 37.5–51.0)
MCH: 31.4 pg (ref 26.6–33.0)
MCHC: 33.8 g/dL (ref 31.5–35.7)
MCV: 93 fL (ref 79–97)
Platelets: 213 10*3/uL (ref 150–450)
RBC: 4.91 x10E6/uL (ref 4.14–5.80)
RDW: 13.1 % (ref 12.3–15.4)
WBC: 6.3 10*3/uL (ref 3.4–10.8)

## 2018-01-25 LAB — BASIC METABOLIC PANEL
BUN/Creatinine Ratio: 19 (ref 9–20)
BUN: 19 mg/dL (ref 6–24)
CALCIUM: 9.7 mg/dL (ref 8.7–10.2)
CHLORIDE: 102 mmol/L (ref 96–106)
CO2: 24 mmol/L (ref 20–29)
Creatinine, Ser: 0.98 mg/dL (ref 0.76–1.27)
GFR calc non Af Amer: 88 mL/min/{1.73_m2} (ref >59)
GFR, EST AFRICAN AMERICAN: 102 mL/min/{1.73_m2} (ref >59)
Glucose: 116 mg/dL — ABNORMAL HIGH (ref 65–99)
Potassium: 5.2 mmol/L (ref 3.5–5.2)
Sodium: 141 mmol/L (ref 134–144)

## 2018-02-06 MED FILL — BUPROPION HCL SR 200 MG TAB: 200 | 90 days supply | Qty: 180 | Fill #1

## 2018-02-09 ENCOUNTER — Encounter: Payer: Self-pay | Admitting: Family Medicine

## 2018-02-09 ENCOUNTER — Ambulatory Visit: Payer: 59 | Admitting: Family Medicine

## 2018-02-09 VITALS — BP 128/90 | Ht 71.0 in | Wt 237.0 lb

## 2018-02-09 DIAGNOSIS — G4733 Obstructive sleep apnea (adult) (pediatric): Secondary | ICD-10-CM

## 2018-02-09 DIAGNOSIS — J45991 Cough variant asthma: Secondary | ICD-10-CM

## 2018-02-09 DIAGNOSIS — E785 Hyperlipidemia, unspecified: Secondary | ICD-10-CM

## 2018-02-09 DIAGNOSIS — I10 Essential (primary) hypertension: Secondary | ICD-10-CM | POA: Diagnosis not present

## 2018-02-09 DIAGNOSIS — F411 Generalized anxiety disorder: Secondary | ICD-10-CM

## 2018-02-09 DIAGNOSIS — R7301 Impaired fasting glucose: Secondary | ICD-10-CM

## 2018-02-09 DIAGNOSIS — R413 Other amnesia: Secondary | ICD-10-CM

## 2018-02-09 MED ORDER — ESOMEPRAZOLE MAGNESIUM 40 MG PO CPDR: DELAYED_RELEASE_CAPSULE | ORAL | 1 refills | Status: DC

## 2018-02-09 MED ORDER — BUPROPION HCL ER (SR) 200 MG PO TB12: 200.0000 mg | ORAL_TABLET | Freq: Two times a day (BID) | ORAL | 1 refills | Status: DC

## 2018-02-09 MED ORDER — VALSARTAN 160 MG PO TABS: 160.0000 mg | ORAL_TABLET | Freq: Every morning | ORAL | 1 refills | Status: DC

## 2018-02-09 MED ORDER — FLUTICASONE PROPIONATE 50 MCG/ACT NA SUSP: 2.0000 | Freq: Every day | NASAL | 5 refills | Status: DC

## 2018-02-09 MED ORDER — MOMETASONE FURO-FORMOTEROL FUM 100-5 MCG/ACT IN AERO: 2.0000 | INHALATION_SPRAY | Freq: Two times a day (BID) | RESPIRATORY_TRACT | 5 refills | Status: DC

## 2018-02-09 NOTE — Progress Notes (Signed)
Subjective:    Patient ID: Christopher Bowers, male    DOB: 1965/08/02, 52 y.o.   MRN: [id number]  Hypertension  This is a chronic problem. Treatments tried: valsartan.  pt states bp at home has been running high.  Blood pressure medicine and blood pressure levels reviewed today with patient. Compliant with blood pressure medicine. States does not miss a dose. No obvious side effects. Blood pressure generally good when checked elsewhere. Watching salt intake.  Results for orders placed or performed in visit on 08/04/17  Lipid Profile  Result Value Ref Range   Cholesterol, Total 216 (H) 100 - 199 mg/dL   Triglycerides 132 0 - 149 mg/dL   HDL 51 >39 mg/dL   VLDL Cholesterol Cal 26 5 - 40 mg/dL   LDL Calculated 139 (H) 0 - 99 mg/dL   Chol/HDL Ratio 4.2 0.0 - 5.0 ratio  Hepatic function panel  Result Value Ref Range   Total Protein 6.7 6.0 - 8.5 g/dL   Albumin 4.9 3.5 - 5.5 g/dL   Bilirubin Total 0.5 0.0 - 1.2 mg/dL   Bilirubin, Direct 0.17 0.00 - 0.40 mg/dL   Alkaline Phosphatase 52 39 - 117 IU/L   AST 27 0 - 40 IU/L   ALT 34 0 - 44 IU/L   Recent Results (from the past 2160 hour(s))  HgB A1c     Status: None   Collection Time: 01/24/18  8:23 AM  Result Value Ref Range   Hgb A1c MFr Bld 5.6 4.8 - 5.6 %    Comment:          Prediabetes: 5.7 - 6.4          Diabetes: >6.4          Glycemic control for adults with diabetes: <7.0    Est. average glucose Bld gHb Est-mCnc 114 mg/dL  PSA     Status: None   Collection Time: 01/24/18  8:24 AM  Result Value Ref Range   Prostate Specific Ag, Serum 1.0 0.0 - 4.0 ng/mL    Comment: Roche ECLIA methodology. According to the American Urological Association, Serum PSA should decrease and remain at undetectable levels after radical prostatectomy. The AUA defines biochemical recurrence as an initial PSA value 0.2 ng/mL or greater followed by a subsequent confirmatory PSA value 0.2 ng/mL or greater. Values obtained with different assay methods or  kits cannot be used interchangeably. Results cannot be interpreted as absolute evidence of the presence or absence of malignant disease.   Lipid Profile     Status: Abnormal   Collection Time: 01/24/18  8:24 AM  Result Value Ref Range   Cholesterol, Total 216 (H) 100 - 199 mg/dL   Triglycerides 132 0 - 149 mg/dL   HDL 51 >39 mg/dL   VLDL Cholesterol Cal 26 5 - 40 mg/dL   LDL Calculated 139 (H) 0 - 99 mg/dL   Chol/HDL Ratio 4.2 0.0 - 5.0 ratio    Comment:                                   T. Chol/HDL Ratio                                             Men  Women  1/2 Avg.Risk  3.4    3.3                                   Avg.Risk  5.0    4.4                                2X Avg.Risk  9.6    7.1                                3X Avg.Risk 23.4   11.0   Hepatic function panel     Status: None   Collection Time: 01/24/18  8:24 AM  Result Value Ref Range   Total Protein 6.7 6.0 - 8.5 g/dL   Albumin 4.9 3.5 - 5.5 g/dL   Bilirubin Total 0.5 0.0 - 1.2 mg/dL   Bilirubin, Direct 0.17 0.00 - 0.40 mg/dL   Alkaline Phosphatase 52 39 - 117 IU/L   AST 27 0 - 40 IU/L   ALT 34 0 - 44 IU/L  CBC     Status: None   Collection Time: 01/24/18  8:25 AM  Result Value Ref Range   WBC 6.3 3.4 - 10.8 x10E3/uL   RBC 4.91 4.14 - 5.80 x10E6/uL   Hemoglobin 15.4 13.0 - 17.7 g/dL   Hematocrit 45.6 37.5 - 51.0 %   MCV 93 79 - 97 fL   MCH 31.4 26.6 - 33.0 pg   MCHC 33.8 31.5 - 35.7 g/dL   RDW 13.1 12.3 - 15.4 %   Platelets 213 150 - 450 [id number]  Basic Metabolic Panel (BMET)     Status: Abnormal   Collection Time: 01/24/18  8:25 AM  Result Value Ref Range   Glucose 116 (H) 65 - 99 mg/dL   BUN 19 6 - 24 mg/dL   Creatinine, Ser 0.98 0.76 - 1.27 mg/dL   GFR calc non Af Amer 88 >59 mL/min/1.73   GFR calc Af Amer 102 >59 mL/min/1.73   BUN/Creatinine Ratio 19 9 - 20   Sodium 141 134 - 144 mmol/L   Potassium 5.2 3.5 - 5.2 mmol/L   Chloride 102 96 - 106 mmol/L   CO2 24 20 - 29  mmol/L   Calcium 9.7 8.7 - 10.2 mg/dL   Patient notes ongoing compliance with antidepressant medication. No obvious side effects. Reports does not miss a dose. Overall continues to help depression substantially. No thoughts of homicide or suicide. Would like to maintain medication. Patient is primarily for anxiety.  Also exacerbated since substantial brain injury several years ago.  See prior notes.  Ongoing challenges with asthma.  Compliant with Dulera.  Rare use of albuterol.  States it definitely helps him a lot.   No other concerns today.     Review of Systems No headache, no major weight loss or weight gain, no chest pain no back pain abdominal pain no change in bowel habits complete ROS otherwise negative     Objective:   Physical Exam  Alert and oriented, vitals reviewed and stable, NAD ENT-TM's and ext canals WNL bilat via otoscopic exam Soft palate, tonsils and post pharynx WNL via oropharyngeal exam Neck-symmetric, no masses; thyroid nonpalpable and nontender Pulmonary-no tachypnea or accessory muscle use; Clear without wheezes via auscultation Card--no abnrml murmurs, rhythm reg and rate WNL Carotid  pulses symmetric, without bruits       Assessment & Plan:  Impression 1 generalized anxiety disorder.  Excellent control medication.  Patient to maintain  2.  Hypertension.  Good control discussed to maintain same meds  3.  Asthma clinically stable.  Medications well.  No side effects.  Flu shot encouraged patient to get at work.  Follow-up in 6 months for wellness plus chronic

## 2018-02-13 ENCOUNTER — Telehealth: Payer: Self-pay | Admitting: Family Medicine

## 2018-02-13 NOTE — Telephone Encounter (Signed)
Patient advised Dr Richardson Landry recommends to continue to check blood pressure  daily o v next week to discuss. Patient verbalized understanding and scheduled follow up office visit.

## 2018-02-13 NOTE — Telephone Encounter (Signed)
Pt has been checking his BP the last 3-4 days with machine and manually it has been running 150/100 each time. He would like to know if his meds need to be increased a little bit or if he needs to come back in and discuss with Dr. Richardson Landry

## 2018-02-13 NOTE — Telephone Encounter (Signed)
We disc this in the office just four days ago, rec cont to  Ck bp s daily o v next week to disc

## 2018-02-20 ENCOUNTER — Ambulatory Visit: Payer: 59 | Admitting: Family Medicine

## 2018-02-20 ENCOUNTER — Encounter: Payer: Self-pay | Admitting: Family Medicine

## 2018-02-20 VITALS — BP 134/86 | Ht 71.0 in | Wt 238.0 lb

## 2018-02-20 DIAGNOSIS — I10 Essential (primary) hypertension: Secondary | ICD-10-CM | POA: Diagnosis not present

## 2018-02-20 MED ORDER — AMLODIPINE BESYLATE 5 MG PO TABS: 5.0000 mg | ORAL_TABLET | Freq: Every day | ORAL | 1 refills | Status: DC

## 2018-02-20 MED FILL — AMLODIPINE BESYLATE 5 MG TA: 5 | 90 days supply | Qty: 90 | Fill #0

## 2018-02-20 NOTE — Patient Instructions (Addendum)
lifesource b p monitors seeem to be fairly accurate  Omni 5 seem to be fairly accurate  Your b p is mildly elevated here with our instruments at 136 over 92   Goal bp's many different opionions  We should shoot for getting your top number down to 130 if at all possible (tho many experts say 140 is good)  And we should shoot for diastolic 47WG less

## 2018-02-20 NOTE — Progress Notes (Signed)
   Subjective:    Patient ID: Christopher Bowers, male    DOB: 1965/11/14, 52 y.o.   MRN: [id number]  Hypertension  This is a new problem. The current episode started 1 to 4 weeks ago. (Tightness in chest maybe from anxiousness regarding BP. )  Pt was here a few weeks ago and provider instructed patient to take BP for one week and return for visit.   Patient has been concerned about her blood pressure.  Numbers often elevated when checked at home.  Claims complete compliance with medication. BP reading in left arm with home machine 151/106   Review of Systems No headache, no major weight loss or weight gain, no chest pain no back pain abdominal pain no change in bowel habits complete ROS otherwise negative     Objective:   Physical Exam  Alert vitals stable, NAD. Blood pressure good on repeat. HEENT normal. Lungs clear. Heart regular rate and rhythm.       Assessment & Plan:  Impression hypertension.  Suboptimum control discussed.  We will add Norvasc 5 mg daily.  Proper use discussed follow-up regular appointment.  Ideas regarding new blood pressure machine was given

## 2018-03-07 ENCOUNTER — Ambulatory Visit: Payer: 59 | Admitting: Nurse Practitioner

## 2018-03-13 MED FILL — VALSARTAN 160 MG TABLET: 160 | 90 days supply | Qty: 90 | Fill #0

## 2018-03-27 MED FILL — ESOMEPRAZOLE MAG DR 40 MG C: 40 | 90 days supply | Qty: 180 | Fill #0

## 2018-04-28 MED FILL — FLUTICASONE PROP 50 MCG SPR: 50 | 30 days supply | Qty: 16 | Fill #0

## 2018-05-01 MED FILL — BUPROPION HCL SR 200 MG TAB: 200 | 90 days supply | Qty: 180 | Fill #0

## 2018-05-03 MED FILL — AMLODIPINE BESYLATE 5 MG TA: 5 | 90 days supply | Qty: 90 | Fill #1

## 2018-05-30 ENCOUNTER — Telehealth: Payer: Self-pay | Admitting: Family Medicine

## 2018-05-30 MED ORDER — OSELTAMIVIR PHOSPHATE 75 MG PO CAPS: 75.0000 mg | ORAL_CAPSULE | Freq: Two times a day (BID) | ORAL | 0 refills | Status: DC

## 2018-05-30 NOTE — Telephone Encounter (Signed)
Patient would like to have prescription sent to [facility] outpatient pharmacy just in case he develops symptoms and has agreed to be seen if developing worsening symptoms

## 2018-05-30 NOTE — Telephone Encounter (Signed)
Patient stated in 2014 he had a flu like illness that went into pneumonia and he went into respiratory failure.. Patient stated his son was diagnosed with the flu one week ago and now the patient has a headache-no other symptoms and is scared he is going to end up with the flu(had flu shot per patient). Patient wants to know what you advise he is ok with waiting to see if he develops sx but was just wondering what was advised

## 2018-05-30 NOTE — Telephone Encounter (Signed)
This is the patient that called in with no real symptoms of the flu other than a h/a. His son was recently treated for the flu.  I advised patient that without any real symptoms he should wait 24-36 hours and call us back.  I would feel better if someone would call him just to be sure because per patient "he almost died from the flu and was in ICU for a week because of it."

## 2018-05-30 NOTE — Telephone Encounter (Signed)
Please go ahead and send Tamiflu 75 mg 1 twice daily for 5 days as directed [facility] thank you  As you discussed with the patient should he come down with the flu and start medicine we still recommend a  office visit-essentially this is to help him have some measure of coverage that he could use if he should start having the flu

## 2018-05-30 NOTE — Telephone Encounter (Signed)
So I certainly hear what he is saying-I am very familiar with his situation Currently based on his symptoms alone I doubt he has the flu  Warning signs to watch for if he starts developing headache body aches sore throat runny nose cough fever chills and not feeling good especially for kicks in fairly quickly over the course of the day-then more than likely at that point he would have the flu  I am willing to send him a prescription of Tamiflu-so he can have a prescription on hand for New Year's holiday- this would be beneficial if he is concerned that this will blossom into the flu over the next 24 hours-I would recommend though holding off on starting Tamiflu until he has more symptoms that point toward the flu  Certainly if the patient starts developing fever chills cough he ought to be seen  The other option is is he can approach this symptomatically over the next 48 hours and if he starts developing greater symptoms of the flu to come see [place] on Thursday  Talk with the patient see what he would like to do

## 2018-05-30 NOTE — Telephone Encounter (Addendum)
Prescription sent electronically to pharmacy. Patient aware. 

## 2018-06-02 ENCOUNTER — Encounter: Payer: Self-pay | Admitting: Family Medicine

## 2018-06-02 ENCOUNTER — Telehealth: Payer: Self-pay | Admitting: Family Medicine

## 2018-06-02 ENCOUNTER — Ambulatory Visit: Payer: 59 | Admitting: Family Medicine

## 2018-06-02 VITALS — BP 130/84 | Temp 98.9°F | Ht 71.0 in | Wt 236.6 lb

## 2018-06-02 DIAGNOSIS — E785 Hyperlipidemia, unspecified: Secondary | ICD-10-CM

## 2018-06-02 DIAGNOSIS — B9689 Other specified bacterial agents as the cause of diseases classified elsewhere: Secondary | ICD-10-CM

## 2018-06-02 DIAGNOSIS — R7301 Impaired fasting glucose: Secondary | ICD-10-CM

## 2018-06-02 DIAGNOSIS — J019 Acute sinusitis, unspecified: Secondary | ICD-10-CM

## 2018-06-02 MED ORDER — LEVOFLOXACIN 500 MG PO TABS: 500.0000 mg | ORAL_TABLET | Freq: Every day | ORAL | 0 refills | Status: DC

## 2018-06-02 MED FILL — levoFLOXacin 500 MG TABS: 500 | 10 days supply | Qty: 10 | Fill #0

## 2018-06-02 NOTE — Progress Notes (Signed)
   Subjective:    Patient ID: Christopher Bowers, male    DOB: 05/30/1966, 53 y.o.   MRN: [id number]  Cough  This is a new problem. The current episode started in the past 7 days. Associated symptoms include headaches, nasal congestion, a sore throat and wheezing. Treatments tried: zicam, advil and tylenol.    First started a few days ago  Seemed to start with allergy like symtoms  A bit of a dry ough   Voice got    No fver    Wheezy  Coughing no  Stuff out   Clear nasaal cog or discharge  Notes  Sinus pressure   Did get a flu sot this yr  famiy testong pos for the flu    Energy level not the best      achey at times     Review of Systems  HENT: Positive for sore throat.   Respiratory: Positive for cough and wheezing.   Neurological: Positive for headaches.       Objective:   Physical Exam Alert, mild malaise. Hydration good Vitals stable. frontal/ maxillary tenderness evident positive nasal congestion. pharynx normal neck supple  lungs clear/no crackles or wheezes. heart regular in rhythm        Assessment & Plan:  Impression rhinosinusitis likely post viral, discussed with patient. plan antibiotics prescribed. Questions answered. Symptomatic care discussed. warning signs discussed. WSL

## 2018-06-02 NOTE — Telephone Encounter (Signed)
Last labs on 01/24/18 CBC, Bmet, PSA, Lipid, hepatic, hgb a1c. Please advise. Thank you

## 2018-06-02 NOTE — Telephone Encounter (Signed)
Pt has CPE scheduled for 08/10/18. He would like to have lab work ordered and done before appt.

## 2018-06-05 NOTE — Telephone Encounter (Signed)
Orders put in and pt notified. He wants to do in [place]. Orders mailed to pt.

## 2018-06-05 NOTE — Telephone Encounter (Signed)
Lip A1c. Wait til one week before visit

## 2018-06-08 ENCOUNTER — Ambulatory Visit: Payer: 59 | Admitting: Nurse Practitioner

## 2018-06-26 MED FILL — VALSARTAN 160 MG TABLET: 160 | 90 days supply | Qty: 90 | Fill #1

## 2018-07-17 ENCOUNTER — Telehealth: Payer: Self-pay | Admitting: Family Medicine

## 2018-07-17 ENCOUNTER — Other Ambulatory Visit: Payer: Self-pay | Admitting: *Deleted

## 2018-07-17 MED ORDER — ESOMEPRAZOLE MAGNESIUM 40 MG PO CPDR: DELAYED_RELEASE_CAPSULE | ORAL | 0 refills | Status: DC

## 2018-07-17 MED ORDER — ALBUTEROL SULFATE HFA 108 (90 BASE) MCG/ACT IN AERS: 2.0000 | INHALATION_SPRAY | RESPIRATORY_TRACT | 0 refills | Status: DC | PRN

## 2018-07-17 MED ORDER — VALSARTAN 160 MG PO TABS: 160.0000 mg | ORAL_TABLET | Freq: Every morning | ORAL | 0 refills | Status: DC

## 2018-07-17 MED ORDER — AMLODIPINE BESYLATE 5 MG PO TABS: 5.0000 mg | ORAL_TABLET | Freq: Every day | ORAL | 0 refills | Status: DC

## 2018-07-17 MED ORDER — FLUTICASONE PROPIONATE 50 MCG/ACT NA SUSP: 2.0000 | Freq: Every day | NASAL | 0 refills | Status: DC

## 2018-07-17 MED ORDER — BUPROPION HCL ER (SR) 200 MG PO TB12: 200.0000 mg | ORAL_TABLET | Freq: Two times a day (BID) | ORAL | 0 refills | Status: DC

## 2018-07-17 MED ORDER — MOMETASONE FURO-FORMOTEROL FUM 100-5 MCG/ACT IN AERO: 2.0000 | INHALATION_SPRAY | Freq: Two times a day (BID) | RESPIRATORY_TRACT | 0 refills | Status: DC

## 2018-07-17 NOTE — Telephone Encounter (Signed)
Refills sent per dr steve. Pt notified.  

## 2018-07-17 NOTE — Telephone Encounter (Signed)
Requesting refill for:  mometasone-formoterol (DULERA) 100-5 MCG/ACT AERO  valsartan (DIOVAN) 160 MG tablet   fluticasone (FLONASE) 50 MCG/ACT nasal spray  esomeprazole (NEXIUM) 40 MG capsule   buPROPion (WELLBUTRIN SR) 200 MG 12 hr tablet  amLODipine (NORVASC) 5 MG tablet   albuterol (PROVENTIL HFA;VENTOLIN HFA) 108 (90 Base) MCG/ACT inhaler   Wellness: 08/16/18  Pharmacy:  [place], [place] - [address] [facility].

## 2018-08-03 MED FILL — BUPROPION HCL SR 200 MG TAB: 200 | 90 days supply | Qty: 180 | Fill #1

## 2018-08-07 DIAGNOSIS — E785 Hyperlipidemia, unspecified: Secondary | ICD-10-CM | POA: Diagnosis not present

## 2018-08-07 DIAGNOSIS — R7301 Impaired fasting glucose: Secondary | ICD-10-CM | POA: Diagnosis not present

## 2018-08-08 LAB — LIPID PANEL
CHOL/HDL RATIO: 3.5 ratio (ref 0.0–5.0)
Cholesterol, Total: 189 mg/dL (ref 100–199)
HDL: 54 mg/dL (ref >39)
LDL CALC: 119 mg/dL — AB (ref 0–99)
Triglycerides: 81 mg/dL (ref 0–149)
VLDL Cholesterol Cal: 16 mg/dL (ref 5–40)

## 2018-08-08 LAB — HEMOGLOBIN A1C
Est. average glucose Bld gHb Est-mCnc: 120 mg/dL
Hgb A1c MFr Bld: 5.8 % — ABNORMAL HIGH (ref 4.8–5.6)

## 2018-08-10 ENCOUNTER — Encounter: Payer: 59 | Admitting: Family Medicine

## 2018-08-14 MED FILL — DULERA 100 MCG/5 MCG INH: 100-5 | 30 days supply | Qty: 13 | Fill #0

## 2018-08-14 MED FILL — AMLODIPINE BESYLATE 5 MG TA: 5 | 90 days supply | Qty: 90 | Fill #0

## 2018-08-14 MED FILL — FLUTICASONE PROP 50 MCG SPR: 50 | 30 days supply | Qty: 16 | Fill #0

## 2018-08-14 MED FILL — VENTOLIN HFA 90 MCG INHALER: 108 (90 BAS | 17 days supply | Qty: 18 | Fill #0

## 2018-08-16 ENCOUNTER — Encounter: Payer: Self-pay | Admitting: Family Medicine

## 2018-08-16 ENCOUNTER — Ambulatory Visit (INDEPENDENT_AMBULATORY_CARE_PROVIDER_SITE_OTHER): Payer: 59 | Admitting: Family Medicine

## 2018-08-16 ENCOUNTER — Other Ambulatory Visit: Payer: Self-pay

## 2018-08-16 ENCOUNTER — Ambulatory Visit: Payer: 59 | Admitting: Nurse Practitioner

## 2018-08-16 VITALS — BP 122/90 | Ht 71.0 in | Wt 238.0 lb

## 2018-08-16 DIAGNOSIS — I10 Essential (primary) hypertension: Secondary | ICD-10-CM

## 2018-08-16 DIAGNOSIS — E785 Hyperlipidemia, unspecified: Secondary | ICD-10-CM | POA: Diagnosis not present

## 2018-08-16 DIAGNOSIS — F411 Generalized anxiety disorder: Secondary | ICD-10-CM

## 2018-08-16 DIAGNOSIS — Z0001 Encounter for general adult medical examination with abnormal findings: Secondary | ICD-10-CM | POA: Diagnosis not present

## 2018-08-16 MED ORDER — ALBUTEROL SULFATE HFA 108 (90 BASE) MCG/ACT IN AERS: 2.0000 | INHALATION_SPRAY | RESPIRATORY_TRACT | 1 refills | Status: DC | PRN

## 2018-08-16 MED ORDER — BUPROPION HCL ER (SR) 200 MG PO TB12: 200.0000 mg | ORAL_TABLET | Freq: Two times a day (BID) | ORAL | 1 refills | Status: DC

## 2018-08-16 MED ORDER — AMLODIPINE BESYLATE 5 MG PO TABS: 5.0000 mg | ORAL_TABLET | Freq: Every day | ORAL | 1 refills | Status: DC

## 2018-08-16 MED ORDER — ESOMEPRAZOLE MAGNESIUM 40 MG PO CPDR: DELAYED_RELEASE_CAPSULE | ORAL | 1 refills | Status: DC

## 2018-08-16 MED ORDER — MOMETASONE FURO-FORMOTEROL FUM 100-5 MCG/ACT IN AERO: 2.0000 | INHALATION_SPRAY | Freq: Two times a day (BID) | RESPIRATORY_TRACT | 1 refills | Status: DC

## 2018-08-16 MED ORDER — FLUTICASONE PROPIONATE 50 MCG/ACT NA SUSP: 2.0000 | Freq: Every day | NASAL | 1 refills | Status: DC

## 2018-08-16 MED ORDER — VALSARTAN 160 MG PO TABS: 160.0000 mg | ORAL_TABLET | Freq: Every morning | ORAL | 1 refills | Status: DC

## 2018-08-16 NOTE — Progress Notes (Signed)
Subjective:    Patient ID: Christopher Bowers, male    DOB: [birth year], 53 y.o.   MRN: [id number]  HPI  The patient comes in today for a wellness visit.    A review of their health history was completed.  A review of medications was also completed.  Any needed refills; Yes  Eating habits: Good  Falls/  MVA accidents in past few months: No  Regular exercise: Yes   Specialist pt sees on regular basis: No  Preventative health issues were discussed.   Additional concerns: Also here for chronic health issues:  Hypertension:He takes Valsartan160 mg once per day, Amlodipine 5 mg once per day.  Gerd:Nexium 40 mg once per day for reflux.  Results for orders placed or performed in visit on 06/02/18  Lipid panel  Result Value Ref Range   Cholesterol, Total 189 100 - 199 mg/dL   Triglycerides 81 0 - 149 mg/dL   HDL 54 >39 mg/dL   VLDL Cholesterol Cal 16 5 - 40 mg/dL   LDL Calculated 119 (H) 0 - 99 mg/dL   Chol/HDL Ratio 3.5 0.0 - 5.0 ratio  Hemoglobin A1c  Result Value Ref Range   Hgb A1c MFr Bld 5.8 (H) 4.8 - 5.6 %   Est. average glucose Bld gHb Est-mCnc 120 mg/dL   Going to the [facility] today       Blood pressure medicine and blood pressure levels reviewed today with patient. Compliant with blood pressure medicine. States does not miss a dose. No obvious side effects. Blood pressure generally good when checked elsewhere. Watching salt intake.    Patient notes ongoing compliance with antidepressant medication. No obvious side effects. Reports does not miss a dose. Overall continues to help depression substantially. No thoughts of homicide or suicide. Would like to maintain medication.         Review of Systems  Constitutional: Negative for activity change, appetite change and fever.  HENT: Negative for congestion and rhinorrhea.   Eyes: Negative for discharge.  Respiratory: Negative for cough and wheezing.   Cardiovascular: Negative for chest pain.   Gastrointestinal: Negative for abdominal pain, blood in stool and vomiting.  Genitourinary: Negative for difficulty urinating and frequency.  Musculoskeletal: Negative for neck pain.  Skin: Negative for rash.  Allergic/Immunologic: Negative for environmental allergies and food allergies.  Neurological: Negative for weakness and headaches.  Psychiatric/Behavioral: Negative for agitation.  All other systems reviewed and are negative.      Objective:   Physical Exam Vitals signs reviewed.  Constitutional:      Appearance: He is well-developed.  HENT:     Head: Normocephalic and atraumatic.     Right Ear: External ear normal.     Left Ear: External ear normal.     Nose: Nose normal.  Eyes:     Pupils: Pupils are equal, round, and reactive to light.  Neck:     Musculoskeletal: Normal range of motion and neck supple.     Thyroid: No thyromegaly.  Cardiovascular:     Rate and Rhythm: Normal rate and regular rhythm.     Heart sounds: Normal heart sounds. No murmur.  Pulmonary:     Effort: Pulmonary effort is normal. No respiratory distress.     Breath sounds: Normal breath sounds. No wheezing.  Abdominal:     General: Bowel sounds are normal. There is no distension.     Palpations: Abdomen is soft. There is no mass.     Tenderness: There is  no abdominal tenderness.  Genitourinary:    Penis: Normal.   Musculoskeletal: Normal range of motion.  Lymphadenopathy:     Cervical: No cervical adenopathy.  Skin:    General: Skin is warm and dry.     Findings: No erythema.  Neurological:     Mental Status: He is alert.     Motor: No abnormal muscle tone.  Psychiatric:        Behavior: Behavior normal.        Judgment: Judgment normal.           Assessment & Plan:  Impression 1 wellness exam.  Diet discussed.  Exercise discussed.  Vaccines discussed colonoscopy due soon.  2.  Hypertension.  Good control discussed maintain same  3.  Elements of depression anxiety discussed  patient maintain same meds rationale discussed  Follow-up in 6 months.  See orders.exercise discussed

## 2018-09-08 MED FILL — DULERA 100 MCG/5 MCG INH: 100-5 | 30 days supply | Qty: 13 | Fill #0

## 2018-09-08 MED FILL — FLUTICASONE PROP 50 MCG SPR: 50 | 30 days supply | Qty: 16 | Fill #0

## 2018-09-08 MED FILL — ESOMEPRAZOLE MAG DR 40 MG C: 40 | 90 days supply | Qty: 180 | Fill #0

## 2018-09-08 MED FILL — ALBUTEROL SULFATE HFA 108 (: 108 (90 BAS | 16 days supply | Qty: 9 | Fill #0

## 2018-09-08 MED FILL — VALSARTAN 80 MG TABLET: 80 | 30 days supply | Qty: 60 | Fill #0

## 2018-11-01 ENCOUNTER — Other Ambulatory Visit: Payer: Self-pay

## 2018-11-01 ENCOUNTER — Encounter: Payer: Self-pay | Admitting: Nurse Practitioner

## 2018-11-01 ENCOUNTER — Ambulatory Visit (INDEPENDENT_AMBULATORY_CARE_PROVIDER_SITE_OTHER): Payer: 59 | Admitting: Nurse Practitioner

## 2018-11-01 ENCOUNTER — Encounter

## 2018-11-01 ENCOUNTER — Telehealth: Payer: Self-pay | Admitting: *Deleted

## 2018-11-01 DIAGNOSIS — Z1211 Encounter for screening for malignant neoplasm of colon: Secondary | ICD-10-CM

## 2018-11-01 DIAGNOSIS — Z0000 Encounter for general adult medical examination without abnormal findings: Secondary | ICD-10-CM | POA: Diagnosis not present

## 2018-11-01 MED ORDER — CLENPIQ 10-3.5-12 MG-GM -GM/160ML PO SOLN: 1.0000 | Freq: Once | ORAL | 0 refills | Status: AC

## 2018-11-01 MED FILL — CLENPIQ 10-3.5-12 MG-GM -GM: 10-3.5-12 M | 1 days supply | Qty: 1 | Fill #0

## 2018-11-01 NOTE — Progress Notes (Signed)
cc'ed to pcp °

## 2018-11-01 NOTE — Patient Instructions (Signed)
Your health issues we discussed today were:   Need for colonoscopy: 1. We will schedule your colonoscopy for you 2. Further recommendations will be made after your colonoscopy  Overall I recommend:  1. Continue your current medications 2. Call us if you have any questions or concerns 3. Return for follow-up based on recommendations made after your colonoscopy, or as needed for stomach/colon problems   Because of recent events of COVID-19 ("Coronavirus"), follow CDC recommendations:  Wash your hand frequently Avoid touching your face Stay away from people who are sick If you have symptoms such as fever, cough, shortness of breath then call your healthcare provider for further guidance If you are sick, STAY AT HOME unless otherwise directed by your healthcare provider. Follow directions from state and national officials regarding staying safe   At [facility] we value your feedback. You may receive a survey about your visit today. Please share your experience as we strive to create trusting relationships with our patients to provide genuine, compassionate, quality care.  We appreciate your understanding and patience as we review any laboratory studies, imaging, and other diagnostic tests that are ordered as we care for you. Our office policy is 5 business days for review of these results, and any emergent or urgent results are addressed in a timely manner for your best interest. If you do not hear from our office in 1 week, please contact us.   We also encourage the use of MyChart, which contains your medical information for your review as well. If you are not enrolled in this feature, an access code is on this after visit summary for your convenience. Thank you for allowing [place] to be involved in your care.  It was great to see you today!  I hope you have a great day!!

## 2018-11-01 NOTE — Telephone Encounter (Signed)
Patient called back. He has to figure out his spouse work schedule and will call back to schedule

## 2018-11-01 NOTE — Telephone Encounter (Signed)
TCS scheduled for 01/11/19 at 9:15am. Rx for prep sent to pharmacy. Orders entered.

## 2018-11-01 NOTE — Progress Notes (Signed)
Referring Provider: Mikey Kirschner, MD Primary Care Physician:  Mikey Kirschner, MD Primary GI:  Dr. Gala Romney  NOTE: Service was provided via telemedicine and was requested by the patient due to COVID-19 pandemic.  Method of visit: Doxy.Me  Patient Location: Work  Psychologist, clinical  Reason for Phone Visit: Schedule TCS  The patient was consented to phone follow-up via telephone encounter including billing of the encounter (yes/no): Yes  Persons present on the phone encounter, with roles: None  Total time (minutes) spent on medical discussion: 18 minutes  Chief Complaint  Patient presents with  . Consult    TCS. Never had TCS prior, no FH colon cancer, no polyps    HPI:   Christopher Bowers is a 53 y.o. male who presents for virtual visit regarding: Schedule colonoscopy.  Nurse/phone triage was deferred to office visit due to medications.  No history of previous colonoscopy found in our system.    Today he states he's doing well. Has been rescheduling TCS for about a year. Never had a colonoscopy. Denies abdominal pain, N/V, hematochezia, melena, fever, chills, unintentional weight loss. Denies URI or flu-like symptoms. Denies loss of sense of taste or smell. Denies chest pain, dyspnea, dizziness, lightheadedness, syncope, near syncope. Denies any other upper or lower GI symptoms.  Past Medical History:  Diagnosis Date  . Carpal tunnel syndrome of right wrist   . GERD (gastroesophageal reflux disease)   . History of acute respiratory failure    Feb 2014  w/  CAP and ARDS  ---  resolved  . History of exercise stress test    01-07-2010   -- negative for ischemia, no chest pain,  hypertensive response to exercise  . History of gastritis   . History of hiatal hernia   . Hypertension   . OSA (obstructive sleep apnea)    per pt study done 2013 OSA intolerant cpap  ,  pt uses oral appliance    Past Surgical History:  Procedure Laterality Date  . CARPAL TUNNEL RELEASE  Right 05/01/2015   Procedure: RIGHT CARPAL TUNNEL RELEASE;  Surgeon: Iran Planas, MD;  Location: [place];  Service: Orthopedics;  Laterality: Right;  ANESTHESIA: LOCAL/IV SEDATION  . TONSILLECTOMY  age 42  . TRANSTHORACIC ECHOCARDIOGRAM  07-11-2012   LVSF  45-55%    Current Outpatient Medications  Medication Sig Dispense Refill  . albuterol (PROVENTIL HFA;VENTOLIN HFA) 108 (90 Base) MCG/ACT inhaler Inhale 2 puffs into the lungs every 4 (four) hours as needed for wheezing or shortness of breath. 1 Inhaler 1  . amLODipine (NORVASC) 5 MG tablet Take 1 tablet (5 mg total) by mouth daily. 90 tablet 1  . buPROPion (WELLBUTRIN SR) 200 MG 12 hr tablet Take 1 tablet (200 mg total) by mouth 2 (two) times daily. 180 tablet 1  . Cholecalciferol (VITAMIN D3) 5000 UNITS TABS Take 5,000 Units by mouth daily. (when he remembers)    . esomeprazole (NEXIUM) 40 MG capsule TAKE 1 CAPSULE BY MOUTH TWICE DAILY BEFORE MEALS 180 capsule 1  . fluticasone (FLONASE) 50 MCG/ACT nasal spray Place 2 sprays into both nostrils daily. 16 g 1  . ibuprofen (ADVIL) 200 MG tablet Take 200 mg by mouth every 6 (six) hours as needed.    . mometasone-formoterol (DULERA) 100-5 MCG/ACT AERO Inhale 2 puffs into the lungs 2 (two) times daily. (Patient taking differently: Inhale 2 puffs into the lungs 2 (two) times daily as needed. ) 1 Inhaler 1  .  valsartan (DIOVAN) 160 MG tablet Take 1 tablet (160 mg total) by mouth every morning. 90 tablet 1   No current facility-administered medications for this visit.     Allergies as of 11/01/2018 - Review Complete 11/01/2018  Allergen Reaction Noted  . Augmentin [amoxicillin-pot clavulanate] Rash 09/07/2012    Family History  Problem Relation Age of Onset  . Diabetes Father   . Heart disease Father   . Diabetes Son   . Colon cancer Neg Hx     Social History   Socioeconomic History  . Marital status: Married    Spouse name: Not on file  . Number of children: 1  .  Years of education: college  . Highest education level: Not on file  Occupational History  . 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 Financial resource strain: Not on file  . Food insecurity:    Worry: Not on file    Inability: Not on file  . Transportation needs:    Medical: Not on file    Non-medical: Not on file  Tobacco Use  . Smoking status: Never Smoker  . Smokeless tobacco: Current User    Types: Snuff  . Tobacco comment: "occasional snuff"  Substance and Sexual Activity  . Alcohol use: Yes    Comment: 2-3 drinks a day  . Drug use: No  . Sexual activity: Not on file  Lifestyle  . Physical activity:    Days per week: Not on file    Minutes per session: Not on file  . Stress: Not on file  Relationships  . Social connections:    Talks on phone: Not on file    Gets together: Not on file    Attends religious service: Not on file    Active member of club or organization: Not on file    Attends meetings of clubs or organizations: Not on file    Relationship status: Not on file  Other Topics Concern  . Not on file  Social History Narrative   Patient drinks about 3 cups of caffeine daily.   Patient is right handed.    Review of Systems: General: Negative for anorexia, weight loss, fever, chills, fatigue, weakness. ENT: Negative for hoarseness, difficulty swallowing. CV: Negative for chest pain, angina, palpitations, peripheral edema.  Respiratory: Negative for dyspnea at rest, cough, sputum, wheezing.  GI: See history of present illness. MS: Negative for joint pain, low back pain.  Derm: Negative for rash or itching.  Endo: Negative for unusual weight change.  Heme: Negative for bruising or bleeding. Allergy: Negative for rash or hives.  Physical Exam: Note: limited exam due to virtual visit General:   Alert and oriented. Pleasant and cooperative. Well-nourished and well-developed.  Head:  Normocephalic and  atraumatic. Eyes:  Without icterus, sclera clear and conjunctiva pink.  Ears:  Normal auditory acuity. Skin:  Intact without facial significant lesions or rashes. Neurologic:  Alert and oriented x4;  grossly normal neurologically. Psych:  Alert and cooperative. Normal mood and affect. Heme/Lymph/Immune: No excessive bruising noted.

## 2018-11-01 NOTE — Assessment & Plan Note (Signed)
The patient is due for first-ever screening colonoscopy.  He was brought into the office/virtual visit due to medications that will necessitate augmented sedation.  He is generally asymptomatic from a GI standpoint.  At this point we will proceed with his first ever colonoscopy.  Further recommendations to follow  Proceed with TCS on propofol/MAC with Dr. Gala Romney in near future: the risks, benefits, and alternatives have been discussed with the patient in detail. The patient states understanding and desires to proceed.  The patient is currently on Wellbutrin.  No other anticoagulants, anxiolytics, chronic pain medications, or antidepressants.  He does drink alcohol, approximately 2-3 drinks a day.  Denies recreational drugs.  Because of medication and daily alcohol consumption we will plan for the procedure on propofol/MAC to promote adequate sedation.

## 2018-11-01 NOTE — Telephone Encounter (Signed)
Pre-op appt 01/08/19 at 12:45pm. Letter mailed with procedure instructions.

## 2018-11-01 NOTE — Telephone Encounter (Signed)
LMOVM to call back to schedule TCS with RMR with propofol

## 2018-11-02 MED FILL — BUPROPION HCL SR 200 MG TAB: 200 | 90 days supply | Qty: 180 | Fill #0

## 2018-11-06 MED FILL — AMLODIPINE BESYLATE 5 MG TA: 5 | 90 days supply | Qty: 90 | Fill #0

## 2018-11-07 ENCOUNTER — Telehealth: Payer: Self-pay | Admitting: Family Medicine

## 2018-11-07 MED ORDER — LOSARTAN POTASSIUM 100 MG PO TABS: 100.0000 mg | ORAL_TABLET | Freq: Every day | ORAL | 1 refills | Status: DC

## 2018-11-07 MED FILL — LOSARTAN POTASSIUM 100 MG T: 100 | 30 days supply | Qty: 30 | Fill #0

## 2018-11-07 NOTE — Telephone Encounter (Signed)
Switch to losartan 100 daily six mo worth

## 2018-11-07 NOTE — Telephone Encounter (Signed)
Medication switched from Valsartan 160 mg to Losartan 100 mg. Pt contacted and informed of change. Pt verbalized understanding.

## 2018-11-07 NOTE — Telephone Encounter (Signed)
Fax from pharmacy regarding Valsartan 160 mg tablet. Take one tablet by mouth every morning. All Valsartan is on back order. Would you like to change to something else? Please advise. Thank you

## 2018-11-07 NOTE — Addendum Note (Signed)
Addended by: Vicente Males on: 11/07/2018 09:36 AM   Modules accepted: Orders

## 2019-01-01 MED FILL — LOSARTAN POTASSIUM 100 MG T: 100 | 30 days supply | Qty: 30 | Fill #1

## 2019-01-08 ENCOUNTER — Other Ambulatory Visit (HOSPITAL_COMMUNITY): Payer: 59

## 2019-01-08 ENCOUNTER — Encounter (HOSPITAL_COMMUNITY): Admission: RE | Admit: 2019-01-08 | Payer: 59 | Source: Ambulatory Visit

## 2019-01-09 ENCOUNTER — Encounter (HOSPITAL_COMMUNITY)
Admission: RE | Admit: 2019-01-09 | Discharge: 2019-01-09 | Disposition: A | Discharge status: Home / Self Care | Payer: 59 | Source: Ambulatory Visit | Attending: Internal Medicine | Admitting: Internal Medicine

## 2019-01-09 ENCOUNTER — Other Ambulatory Visit: Payer: Self-pay

## 2019-01-09 ENCOUNTER — Other Ambulatory Visit (HOSPITAL_COMMUNITY)
Admission: RE | Admit: 2019-01-09 | Discharge: 2019-01-09 | Disposition: A | Discharge status: Home / Self Care | Payer: 59 | Source: Ambulatory Visit | Attending: Internal Medicine | Admitting: Internal Medicine

## 2019-01-09 DIAGNOSIS — Z720 Tobacco use: Secondary | ICD-10-CM | POA: Diagnosis not present

## 2019-01-09 DIAGNOSIS — Z7951 Long term (current) use of inhaled steroids: Secondary | ICD-10-CM | POA: Diagnosis not present

## 2019-01-09 DIAGNOSIS — Z20828 Contact with and (suspected) exposure to other viral communicable diseases: Secondary | ICD-10-CM | POA: Diagnosis not present

## 2019-01-09 DIAGNOSIS — G4733 Obstructive sleep apnea (adult) (pediatric): Secondary | ICD-10-CM | POA: Diagnosis not present

## 2019-01-09 DIAGNOSIS — Z79899 Other long term (current) drug therapy: Secondary | ICD-10-CM | POA: Diagnosis not present

## 2019-01-09 DIAGNOSIS — Z1211 Encounter for screening for malignant neoplasm of colon: Secondary | ICD-10-CM | POA: Diagnosis not present

## 2019-01-09 DIAGNOSIS — K219 Gastro-esophageal reflux disease without esophagitis: Secondary | ICD-10-CM | POA: Diagnosis not present

## 2019-01-09 DIAGNOSIS — J45909 Unspecified asthma, uncomplicated: Secondary | ICD-10-CM | POA: Diagnosis not present

## 2019-01-09 DIAGNOSIS — I10 Essential (primary) hypertension: Secondary | ICD-10-CM | POA: Diagnosis not present

## 2019-01-09 DIAGNOSIS — K449 Diaphragmatic hernia without obstruction or gangrene: Secondary | ICD-10-CM | POA: Diagnosis not present

## 2019-01-09 DIAGNOSIS — K621 Rectal polyp: Secondary | ICD-10-CM | POA: Diagnosis not present

## 2019-01-09 DIAGNOSIS — K5730 Diverticulosis of large intestine without perforation or abscess without bleeding: Secondary | ICD-10-CM | POA: Diagnosis not present

## 2019-01-09 DIAGNOSIS — Z881 Allergy status to other antibiotic agents status: Secondary | ICD-10-CM | POA: Diagnosis not present

## 2019-01-09 LAB — SARS CORONAVIRUS 2 (TAT 6-24 HRS): SARS Coronavirus 2: NEGATIVE

## 2019-01-09 NOTE — Patient Instructions (Signed)
   Your procedure is scheduled on: 01/11/2019  Report to [facility] at 7:30   AM.  Call this number if you have problems the morning of surgery: [phone]   Remember:              Follow Directions on the letter you received from Your Physician's office regarding the Bowel Prep  :  Take these medicines the morning of surgery with A SIP OF WATERAmlodipine, Wellbutrin, and nexium:    Do not wear jewelry, make-up or nail polish.    Do not bring valuables to the hospital.  Contacts, dentures or bridgework may not be worn into surgery.  .   Patients discharged the day of surgery will not be allowed to drive home.     Colonoscopy, Adult, Care After This sheet gives you information about how to care for yourself after your procedure. Your health care provider may also give you more specific instructions. If you have problems or questions, contact your health care provider. What can I expect after the procedure? After the procedure, it is common to have:  A small amount of blood in your stool for 24 hours after the procedure.  Some gas.  Mild abdominal cramping or bloating.  Follow these instructions at home: General instructions   For the first 24 hours after the procedure: ? Do not drive or use machinery. ? Do not sign important documents. ? Do not drink alcohol. ? Do your regular daily activities at a slower pace than normal. ? Eat soft, easy-to-digest foods. ? Rest often.  Take over-the-counter or prescription medicines only as told by your health care provider.  It is up to you to get the results of your procedure. Ask your health care provider, or the department performing the procedure, when your results will be ready. Relieving cramping and bloating  Try walking around when you have cramps or feel bloated.  Apply heat to your abdomen as told by your health care provider. Use a heat source that your health care provider recommends, such as a moist heat pack or a  heating pad. ? Place a towel between your skin and the heat source. ? Leave the heat on for 20-30 minutes. ? Remove the heat if your skin turns bright red. This is especially important if you are unable to feel pain, heat, or cold. You may have a greater risk of getting burned. Eating and drinking  Drink enough fluid to keep your urine clear or pale yellow.  Resume your normal diet as instructed by your health care provider. Avoid heavy or fried foods that are hard to digest.  Avoid drinking alcohol for as long as instructed by your health care provider. Contact a health care provider if:  You have blood in your stool 2-3 days after the procedure. Get help right away if:  You have more than a small spotting of blood in your stool.  You pass large blood clots in your stool.  Your abdomen is swollen.  You have nausea or vomiting.  You have a fever.  You have increasing abdominal pain that is not relieved with medicine. This information is not intended to replace advice given to you by your health care provider. Make sure you discuss any questions you have with your health care provider. Document Released: 12/30/2003 Document Revised: 02/09/2016 Document Reviewed: 07/29/2015 Elsevier Interactive Patient Education  [employer].

## 2019-01-11 ENCOUNTER — Other Ambulatory Visit: Payer: Self-pay

## 2019-01-11 ENCOUNTER — Encounter (HOSPITAL_COMMUNITY): Payer: Self-pay | Admitting: *Deleted

## 2019-01-11 ENCOUNTER — Encounter (HOSPITAL_COMMUNITY)
Admission: RE | Disposition: A | Discharge status: Home / Self Care | Payer: Self-pay | Source: Home / Self Care | Attending: Internal Medicine

## 2019-01-11 ENCOUNTER — Ambulatory Visit (HOSPITAL_COMMUNITY): Payer: 59 | Admitting: Anesthesiology

## 2019-01-11 ENCOUNTER — Ambulatory Visit (HOSPITAL_COMMUNITY)
Admission: RE | Admit: 2019-01-11 | Discharge: 2019-01-11 | Disposition: A | Discharge status: Home / Self Care | Payer: 59 | Attending: Internal Medicine | Admitting: Internal Medicine

## 2019-01-11 DIAGNOSIS — Z1211 Encounter for screening for malignant neoplasm of colon: Secondary | ICD-10-CM | POA: Diagnosis not present

## 2019-01-11 DIAGNOSIS — G4733 Obstructive sleep apnea (adult) (pediatric): Secondary | ICD-10-CM | POA: Diagnosis not present

## 2019-01-11 DIAGNOSIS — K449 Diaphragmatic hernia without obstruction or gangrene: Secondary | ICD-10-CM | POA: Insufficient documentation

## 2019-01-11 DIAGNOSIS — J45909 Unspecified asthma, uncomplicated: Secondary | ICD-10-CM | POA: Diagnosis not present

## 2019-01-11 DIAGNOSIS — K5730 Diverticulosis of large intestine without perforation or abscess without bleeding: Secondary | ICD-10-CM | POA: Insufficient documentation

## 2019-01-11 DIAGNOSIS — Z881 Allergy status to other antibiotic agents status: Secondary | ICD-10-CM | POA: Insufficient documentation

## 2019-01-11 DIAGNOSIS — Z79899 Other long term (current) drug therapy: Secondary | ICD-10-CM | POA: Insufficient documentation

## 2019-01-11 DIAGNOSIS — K219 Gastro-esophageal reflux disease without esophagitis: Secondary | ICD-10-CM | POA: Insufficient documentation

## 2019-01-11 DIAGNOSIS — Z7951 Long term (current) use of inhaled steroids: Secondary | ICD-10-CM | POA: Insufficient documentation

## 2019-01-11 DIAGNOSIS — Z20828 Contact with and (suspected) exposure to other viral communicable diseases: Secondary | ICD-10-CM | POA: Diagnosis not present

## 2019-01-11 DIAGNOSIS — Z720 Tobacco use: Secondary | ICD-10-CM | POA: Insufficient documentation

## 2019-01-11 DIAGNOSIS — I10 Essential (primary) hypertension: Secondary | ICD-10-CM | POA: Insufficient documentation

## 2019-01-11 DIAGNOSIS — K621 Rectal polyp: Secondary | ICD-10-CM | POA: Insufficient documentation

## 2019-01-11 HISTORY — PX: COLONOSCOPY WITH PROPOFOL: SHX5780

## 2019-01-11 HISTORY — PX: POLYPECTOMY: SHX5525

## 2019-01-11 SURGERY — COLONOSCOPY WITH PROPOFOL
Anesthesia: General

## 2019-01-11 MED ORDER — CHLORHEXIDINE GLUCONATE CLOTH 2 % EX PADS: 6.0000 | MEDICATED_PAD | Freq: Once | CUTANEOUS | Status: DC

## 2019-01-11 MED ORDER — LACTATED RINGERS IV SOLN
INTRAVENOUS | Status: DC
  Administered 2019-01-11: 09:00:00 via INTRAVENOUS

## 2019-01-11 MED ORDER — MIDAZOLAM HCL 2 MG/2ML IJ SOLN
INTRAMUSCULAR | Status: AC
  Filled 2019-01-11: qty 2

## 2019-01-11 MED ORDER — KETAMINE HCL 50 MG/5ML IJ SOSY
PREFILLED_SYRINGE | INTRAMUSCULAR | Status: AC
  Filled 2019-01-11: qty 5

## 2019-01-11 MED ORDER — MIDAZOLAM HCL 5 MG/5ML IJ SOLN
INTRAMUSCULAR | Status: DC | PRN
  Administered 2019-01-11: 2 mg via INTRAVENOUS

## 2019-01-11 MED ORDER — PROPOFOL 500 MG/50ML IV EMUL
INTRAVENOUS | Status: DC | PRN
  Administered 2019-01-11: 09:00:00 via INTRAVENOUS
  Administered 2019-01-11: 150 ug/kg/min via INTRAVENOUS

## 2019-01-11 MED ORDER — MIDAZOLAM HCL 2 MG/2ML IJ SOLN: 0.5000 mg | Freq: Once | INTRAMUSCULAR | Status: DC | PRN

## 2019-01-11 MED ORDER — PROMETHAZINE HCL 25 MG/ML IJ SOLN: 6.2500 mg | INTRAMUSCULAR | Status: DC | PRN

## 2019-01-11 MED ORDER — HYDROMORPHONE HCL 1 MG/ML IJ SOLN: 0.2500 mg | INTRAMUSCULAR | Status: DC | PRN

## 2019-01-11 MED ORDER — HYDROCODONE-ACETAMINOPHEN 7.5-325 MG PO TABS: 1.0000 | ORAL_TABLET | Freq: Once | ORAL | Status: DC | PRN

## 2019-01-11 NOTE — Anesthesia Preprocedure Evaluation (Signed)
Anesthesia Evaluation  Patient identified by MRN, date of birth, ID band Patient awake    Reviewed: Allergy & Precautions, NPO status , Patient's Chart, lab work & pertinent test results  Airway Mallampati: II  TM Distance: >3 FB Neck ROM: Full    Dental no notable dental hx. (+) Teeth Intact, Edentulous Upper   Pulmonary asthma , sleep apnea and Continuous Positive Airway Pressure Ventilation , pneumonia, resolved,  Had H1N1 /ARDS ~6 years ago  Denies PTSD Still uses occ dulera and albuterol -states last was ~5days prior  States breathing is fine now    Pulmonary exam normal breath sounds clear to auscultation       Cardiovascular Exercise Tolerance: Good hypertension, negative cardio ROS Normal cardiovascular examI Rhythm:Regular Rate:Normal     Neuro/Psych negative neurological ROS  negative psych ROS   GI/Hepatic Neg liver ROS, hiatal hernia, GERD  Medicated and Controlled,  Endo/Other  negative endocrine ROS  Renal/GU negative Renal ROS  negative genitourinary   Musculoskeletal negative musculoskeletal ROS (+)   Abdominal   Peds negative pediatric ROS (+)  Hematology negative hematology ROS (+)   Anesthesia Other Findings   Reproductive/Obstetrics negative OB ROS                             Anesthesia Physical Anesthesia Plan  ASA: II  Anesthesia Plan: General   Post-op Pain Management:    Induction: Intravenous  PONV Risk Score and Plan: 2 and Ondansetron and Treatment may vary due to age or medical condition  Airway Management Planned: Simple Face Mask and Nasal Cannula  Additional Equipment:   Intra-op Plan:   Post-operative Plan:   Informed Consent: I have reviewed the patients History and Physical, chart, labs and discussed the procedure including the risks, benefits and alternatives for the proposed anesthesia with the patient or authorized representative who  has indicated his/her understanding and acceptance.     Dental advisory given  Plan Discussed with: CRNA  Anesthesia Plan Comments: (Plan full PPE use  Plan Ga with GETa as needed d/w pt -WTP same after Q&A)        Anesthesia Quick Evaluation

## 2019-01-11 NOTE — Transfer of Care (Signed)
Immediate Anesthesia Transfer of Care Note  Patient: Christopher Bowers  Procedure(s) Performed: COLONOSCOPY WITH PROPOFOL (N/A ) POLYPECTOMY  Patient Location: PACU  Anesthesia Type:General  Level of Consciousness: awake and patient cooperative  Airway & Oxygen Therapy: Patient Spontanous Breathing and Patient connected to nasal cannula oxygen  Post-op Assessment: Report given to RN, Post -op Vital signs reviewed and stable and Patient moving all extremities  Post vital signs: Reviewed and stable  Last Vitals:  Vitals Value Taken Time  BP    Temp    Pulse 83 01/11/19 0924  Resp    SpO2 93 % 01/11/19 0924  Vitals shown include unvalidated device data.  Last Pain:  Vitals:   01/11/19 0855  TempSrc:   PainSc: 0-No pain      Patients Stated Pain Goal: 5 (69/67/89 3810)  Complications: No apparent anesthesia complications

## 2019-01-11 NOTE — Anesthesia Postprocedure Evaluation (Signed)
Anesthesia Post Note  Patient: Christopher Bowers  Procedure(s) Performed: COLONOSCOPY WITH PROPOFOL (N/A ) POLYPECTOMY  Patient location during evaluation: PACU Anesthesia Type: General Level of consciousness: awake and alert and patient cooperative Pain management: pain level controlled Vital Signs Assessment: post-procedure vital signs reviewed and stable Respiratory status: spontaneous breathing, respiratory function stable and nonlabored ventilation Cardiovascular status: blood pressure returned to baseline Postop Assessment: no apparent nausea or vomiting Anesthetic complications: no     Last Vitals:  Vitals:   01/11/19 0755  BP: 136/87  Pulse: 80  Resp: (!) 21  Temp: 36.8 C  SpO2: 96%    Last Pain:  Vitals:   01/11/19 0855  TempSrc:   PainSc: 0-No pain                 Vung Kush J

## 2019-01-11 NOTE — H&P (Signed)
@LOGO @   Primary Care Physician:  Mikey Kirschner, MD Primary Gastroenterologist:  Dr. Gala Romney  Pre-Procedure History & Physical: HPI:  Christopher Bowers is a 53 y.o. male is here for a screening colonoscopy.  No bowel symptoms.  No family history of colon cancer.  No prior colonoscopy.  Past Medical History:  Diagnosis Date  . Carpal tunnel syndrome of right wrist   . GERD (gastroesophageal reflux disease)   . History of acute respiratory failure    Feb 2014  w/  CAP and ARDS  ---  resolved  . History of exercise stress test    01-07-2010   -- negative for ischemia, no chest pain,  hypertensive response to exercise  . History of gastritis   . History of hiatal hernia   . Hypertension   . OSA (obstructive sleep apnea)    per pt study done 2013 OSA intolerant cpap  ,  pt uses oral appliance    Past Surgical History:  Procedure Laterality Date  . CARPAL TUNNEL RELEASE Right 05/01/2015   Procedure: RIGHT CARPAL TUNNEL RELEASE;  Surgeon: Iran Planas, MD;  Location: [place];  Service: Orthopedics;  Laterality: Right;  ANESTHESIA: LOCAL/IV SEDATION  . TONSILLECTOMY  age 27  . TRANSTHORACIC ECHOCARDIOGRAM  07-11-2012   LVSF  45-55%    Prior to Admission medications   Medication Sig Start Date End Date Taking? Authorizing Provider  amLODipine (NORVASC) 5 MG tablet Take 1 tablet (5 mg total) by mouth daily. 08/16/18  Yes Mikey Kirschner, MD  buPROPion [facility] SR) 200 MG 12 hr tablet Take 1 tablet (200 mg total) by mouth 2 (two) times daily. 08/16/18  Yes Mikey Kirschner, MD  esomeprazole (NEXIUM) 40 MG capsule TAKE 1 CAPSULE BY MOUTH TWICE DAILY BEFORE MEALS 08/16/18  Yes Mikey Kirschner, MD  losartan (COZAAR) 100 MG tablet Take 1 tablet (100 mg total) by mouth daily. 11/07/18  Yes Mikey Kirschner, MD  albuterol (PROVENTIL HFA;VENTOLIN HFA) 108 (90 Base) MCG/ACT inhaler Inhale 2 puffs into the lungs every 4 (four) hours as needed for wheezing or shortness of breath.  08/16/18   Mikey Kirschner, MD  Cholecalciferol (VITAMIN D3) 5000 UNITS TABS Take 5,000 Units by mouth daily. (when he remembers)    [provider]  fluticasone (FLONASE) 50 MCG/ACT nasal spray Place 2 sprays into both nostrils daily. 08/16/18   Mikey Kirschner, MD  ibuprofen (ADVIL) 200 MG tablet Take 200 mg by mouth every 6 (six) hours as needed.    [provider]  mometasone-formoterol (DULERA) 100-5 MCG/ACT AERO Inhale 2 puffs into the lungs 2 (two) times daily. Patient taking differently: Inhale 2 puffs into the lungs 2 (two) times daily as needed.  08/16/18   Mikey Kirschner, MD  valsartan (DIOVAN) 160 MG tablet Take 1 tablet (160 mg total) by mouth every morning. 08/16/18   Mikey Kirschner, MD    Allergies as of 11/01/2018 - Review Complete 11/01/2018  Allergen Reaction Noted  . Augmentin [amoxicillin-pot clavulanate] Rash 09/07/2012    Family History  Problem Relation Age of Onset  . Diabetes Father   . Heart disease Father   . Diabetes Son   . Colon cancer Neg Hx     Social History   Socioeconomic History  . Marital status: Married    Spouse name: Not on file  . Number of children: 1  . Years of education: college  . Highest education level: Not on file  Occupational History  . 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 Financial resource strain: Not on file  . Food insecurity    Worry: Not on file    Inability: Not on file  . Transportation needs    Medical: Not on file    Non-medical: Not on file  Tobacco Use  . Smoking status: Never Smoker  . Smokeless tobacco: Current User    Types: Snuff  . Tobacco comment: "occasional snuff"  Substance and Sexual Activity  . Alcohol use: Yes    Comment: 2-3 drinks a day  . Drug use: No  . Sexual activity: Not on file  Lifestyle  . Physical activity    Days per week: Not on file    Minutes per session: Not on file  . Stress: Not on file   Relationships  . Social Herbalist on phone: Not on file    Gets together: Not on file    Attends religious service: Not on file    Active member of club or organization: Not on file    Attends meetings of clubs or organizations: Not on file    Relationship status: Not on file  . Intimate partner violence    Fear of current or ex partner: Not on file    Emotionally abused: Not on file    Physically abused: Not on file    Forced sexual activity: Not on file  Other Topics Concern  . Not on file  Social History Narrative   Patient drinks about 3 cups of caffeine daily.   Patient is right handed.    Review of Systems: See HPI, otherwise negative ROS  Physical Exam: BP 136/87   Pulse 80   Temp 98.3 F (36.8 C) (Oral)   Resp (!) 21   Ht 5\' 11"  (1.803 m)   Wt 104.3 kg   SpO2 96%   BMI 32.08 kg/m  General:   Alert,  Well-developed, well-nourished, pleasant and cooperative in NAD Lungs:  Clear throughout to auscultation.   No wheezes, crackles, or rhonchi. No acute distress. Heart:  Regular rate and rhythm; no murmurs, clicks, rubs,  or gallops. Abdomen:  Soft, nontender and nondistended. No masses, hepatosplenomegaly or hernias noted. Normal bowel sounds, without guarding, and without rebound.    Impression/Plan: Christopher Bowers is now here to undergo a screening colonoscopy.  First ever average risk screening examination.  Risks, benefits, limitations, imponderables and alternatives regarding colonoscopy have been reviewed with the patient. Questions have been answered. All parties agreeable.     Notice:  This dictation was prepared with Dragon dictation along with smaller phrase technology. Any transcriptional errors that result from this process are unintentional and may not be corrected upon review.

## 2019-01-11 NOTE — Discharge Instructions (Signed)
Colonoscopy Discharge Instructions  Read the instructions outlined below and refer to this sheet in the next few weeks. These discharge instructions provide you with general information on caring for yourself after you leave the hospital. Your doctor may also give you specific instructions. While your treatment has been planned according to the most current medical practices available, unavoidable complications occasionally occur. If you have any problems or questions after discharge, call Dr. Gala Romney at [phone]. ACTIVITY  You may resume your regular activity, but move at a slower pace for the next 24 hours.   Take frequent rest periods for the next 24 hours.   Walking will help get rid of the air and reduce the bloated feeling in your belly (abdomen).   No driving for 24 hours (because of the medicine (anesthesia) used during the test).    Do not sign any important legal documents or operate any machinery for 24 hours (because of the anesthesia used during the test).  NUTRITION  Drink plenty of fluids.   You may resume your normal diet as instructed by your doctor.   Begin with a light meal and progress to your normal diet. Heavy or fried foods are harder to digest and may make you feel sick to your stomach (nauseated).   Avoid alcoholic beverages for 24 hours or as instructed.  MEDICATIONS  You may resume your normal medications unless your doctor tells you otherwise.  WHAT YOU CAN EXPECT TODAY  Some feelings of bloating in the abdomen.   Passage of more gas than usual.   Spotting of blood in your stool or on the toilet paper.  IF YOU HAD POLYPS REMOVED DURING THE COLONOSCOPY:  No aspirin products for 7 days or as instructed.   No alcohol for 7 days or as instructed.   Eat a soft diet for the next 24 hours.  FINDING OUT THE RESULTS OF YOUR TEST Not all test results are available during your visit. If your test results are not back during the visit, make an appointment  with your caregiver to find out the results. Do not assume everything is normal if you have not heard from your caregiver or the medical facility. It is important for you to follow up on all of your test results.  SEEK IMMEDIATE MEDICAL ATTENTION IF:  You have more than a spotting of blood in your stool.   Your belly is swollen (abdominal distention).   You are nauseated or vomiting.   You have a temperature over 101.   You have abdominal pain or discomfort that is severe or gets worse throughout the day.   Colon polyp and diverticulosis information provided  Further recommendations to follow pending review of pathology report  I discussed my findings and recommendations with wife, Alyse Low at [phone]      Colon Polyps  Polyps are tissue growths inside the body. Polyps can grow in many places, including the large intestine (colon). A polyp may be a round bump or a mushroom-shaped growth. You could have one polyp or several. Most colon polyps are noncancerous (benign). However, some colon polyps can become cancerous over time. Finding and removing the polyps early can help prevent this. What are the causes? The exact cause of colon polyps is not known. What increases the risk? You are more likely to develop this condition if you:  Have a family history of colon cancer or colon polyps.  Are older than 66 or older than 45 if you are African American.  Have  inflammatory bowel disease, such as ulcerative colitis or Crohn's disease.  Have certain hereditary conditions, such as: ? Familial adenomatous polyposis. ? Lynch syndrome. ? Turcot syndrome. ? Peutz-Jeghers syndrome.  Are overweight.  Smoke cigarettes.  Do not get enough exercise.  Drink too much alcohol.  Eat a diet that is high in fat and red meat and low in fiber.  Had childhood cancer that was treated with abdominal radiation. What are the signs or symptoms? Most polyps do not cause symptoms. If you  have symptoms, they may include:  Blood coming from your rectum when having a bowel movement.  Blood in your stool. The stool may look dark red or black.  Abdominal pain.  A change in bowel habits, such as constipation or diarrhea. How is this diagnosed? This condition is diagnosed with a colonoscopy. This is a procedure in which a lighted, flexible scope is inserted into the anus and then passed into the colon to examine the area. Polyps are sometimes found when a colonoscopy is done as part of routine cancer screening tests. How is this treated? Treatment for this condition involves removing any polyps that are found. Most polyps can be removed during a colonoscopy. Those polyps will then be tested for cancer. Additional treatment may be needed depending on the results of testing. Follow these instructions at home: Lifestyle  Maintain a healthy weight, or lose weight if recommended by your health care provider.  Exercise every day or as told by your health care provider.  Do not use any products that contain nicotine or tobacco, such as cigarettes and e-cigarettes. If you need help quitting, ask your health care provider.  If you drink alcohol, limit how much you have: ? 0-1 drink a day for women. ? 0-2 drinks a day for men.  Be aware of how much alcohol is in your drink. In the [place] one drink equals one 12 oz bottle of beer (355 mL), one 5 oz glass of wine (148 mL), or one 1 oz shot of hard liquor (44 mL). Eating and drinking   Eat foods that are high in fiber, such as fruits, vegetables, and whole grains.  Eat foods that are high in calcium and vitamin D, such as milk, cheese, yogurt, eggs, liver, fish, and broccoli.  Limit foods that are high in fat, such as fried foods and desserts.  Limit the amount of red meat and processed meat you eat, such as hot dogs, sausage, bacon, and lunch meats. General instructions  Keep all follow-up visits as told by your health care  provider. This is important. ? This includes having regularly scheduled colonoscopies. ? Talk to your health care provider about when you need a colonoscopy. Contact a health care provider if:  You have new or worsening bleeding during a bowel movement.  You have new or increased blood in your stool.  You have a change in bowel habits.  You lose weight for no known reason. Summary  Polyps are tissue growths inside the body. Polyps can grow in many places, including the colon.  Most colon polyps are noncancerous (benign), but some can become cancerous over time.  This condition is diagnosed with a colonoscopy.  Treatment for this condition involves removing any polyps that are found. Most polyps can be removed during a colonoscopy. This information is not intended to replace advice given to you by your health care provider. Make sure you discuss any questions you have with your health care provider. Document Released: 02/11/2004 Document  Revised: 09/01/2017 Document Reviewed: 09/01/2017 Elsevier Patient Education  [place].    Diverticulosis  Diverticulosis is a condition that develops when small pouches (diverticula) form in the wall of the large intestine (colon). The colon is where water is absorbed and stool is formed. The pouches form when the inside layer of the colon pushes through weak spots in the outer layers of the colon. You may have a few pouches or many of them. What are the causes? The cause of this condition is not known. What increases the risk? The following factors may make you more likely to develop this condition:  Being older than age 48. Your risk for this condition increases with age. Diverticulosis is rare among people younger than age 52. By age 51, many people have it.  Eating a low-fiber diet.  Having frequent constipation.  Being overweight.  Not getting enough exercise.  Smoking.  Taking over-the-counter pain medicines, like  aspirin and ibuprofen.  Having a family history of diverticulosis. What are the signs or symptoms? In most people, there are no symptoms of this condition. If you do have symptoms, they may include:  Bloating.  Cramps in the abdomen.  Constipation or diarrhea.  Pain in the lower left side of the abdomen. How is this diagnosed? This condition is most often diagnosed during an exam for other colon problems. Because diverticulosis usually has no symptoms, it often cannot be diagnosed independently. This condition may be diagnosed by:  Using a flexible scope to examine the colon (colonoscopy).  Taking an X-ray of the colon after dye has been put into the colon (barium enema).  Doing a CT scan. How is this treated? You may not need treatment for this condition if you have never developed an infection related to diverticulosis. If you have had an infection before, treatment may include:  Eating a high-fiber diet. This may include eating more fruits, vegetables, and grains.  Taking a fiber supplement.  Taking a live bacteria supplement (probiotic).  Taking medicine to relax your colon.  Taking antibiotic medicines. Follow these instructions at home:  Drink 6-8 glasses of water or more each day to prevent constipation.  Try not to strain when you have a bowel movement.  If you have had an infection before: ? Eat more fiber as directed by your health care provider or your diet and nutrition specialist (dietitian). ? Take a fiber supplement or probiotic, if your health care provider approves.  Take over-the-counter and prescription medicines only as told by your health care provider.  If you were prescribed an antibiotic, take it as told by your health care provider. Do not stop taking the antibiotic even if you start to feel better.  Keep all follow-up visits as told by your health care provider. This is important. Contact a health care provider if:  You have pain in your  abdomen.  You have bloating.  You have cramps.  You have not had a bowel movement in 3 days. Get help right away if:  Your pain gets worse.  Your bloating becomes very bad.  You have a fever or chills, and your symptoms suddenly get worse.  You vomit.  You have bowel movements that are bloody or black.  You have bleeding from your rectum. Summary  Diverticulosis is a condition that develops when small pouches (diverticula) form in the wall of the large intestine (colon).  You may have a few pouches or many of them.  This condition is most often diagnosed  during an exam for other colon problems.  If you have had an infection related to diverticulosis, treatment may include increasing the fiber in your diet, taking supplements, or taking medicines. This information is not intended to replace advice given to you by your health care provider. Make sure you discuss any questions you have with your health care provider. Document Released: 02/12/2004 Document Revised: 04/29/2017 Document Reviewed: 04/05/2016 Elsevier Patient Education  2020 [place] After These instructions provide you with information about caring for yourself after your procedure. Your health care provider may also give you more specific instructions. Your treatment has been planned according to current medical practices, but problems sometimes occur. Call your health care provider if you have any problems or questions after your procedure. What can I expect after the procedure? After your procedure, you may:  Feel sleepy for several hours.  Feel clumsy and have poor balance for several hours.  Feel forgetful about what happened after the procedure.  Have poor judgment for several hours.  Feel nauseous or vomit.  Have a sore throat if you had a breathing tube during the procedure. Follow these instructions at home: For at least 24 hours after the procedure:       Have a responsible adult stay with you. It is important to have someone help care for you until you are awake and alert.  Rest as needed.  Do not: ? Participate in activities in which you could fall or become injured. ? Drive. ? Use heavy machinery. ? Drink alcohol. ? Take sleeping pills or medicines that cause drowsiness. ? Make important decisions or sign legal documents. ? Take care of children on your own. Eating and drinking  Follow the diet that is recommended by your health care provider.  If you vomit, drink water, juice, or soup when you can drink without vomiting.  Make sure you have little or no nausea before eating solid foods. General instructions  Take over-the-counter and prescription medicines only as told by your health care provider.  If you have sleep apnea, surgery and certain medicines can increase your risk for breathing problems. Follow instructions from your health care provider about wearing your sleep device: ? Anytime you are sleeping, including during daytime naps. ? While taking prescription pain medicines, sleeping medicines, or medicines that make you drowsy.  If you smoke, do not smoke without supervision.  Keep all follow-up visits as told by your health care provider. This is important. Contact a health care provider if:  You keep feeling nauseous or you keep vomiting.  You feel light-headed.  You develop a rash.  You have a fever. Get help right away if:  You have trouble breathing. Summary  For several hours after your procedure, you may feel sleepy and have poor judgment.  Have a responsible adult stay with you for at least 24 hours or until you are awake and alert. This information is not intended to replace advice given to you by your health care provider. Make sure you discuss any questions you have with your health care provider. Document Released: 09/07/2015 Document Revised: 08/15/2017 Document Reviewed:  09/07/2015 Elsevier Patient Education  2020 [employer].

## 2019-01-11 NOTE — Op Note (Signed)
[facility] Patient Name: Christopher Bowers Procedure Date: 01/11/2019 8:37 AM MRN: [id number] Date of Birth: [birth year] Attending MD: Norvel Richards , MD CSN: [id number] Age: 53 Admit Type: Outpatient Procedure:                Colonoscopy Indications:              Screening for colorectal malignant neoplasm Providers:                Norvel Richards, MD, Otis Peak B. Sharon Seller, RN,                            Raphael Gibney, Technician Referring MD:              Medicines:                Propofol per Anesthesia Complications:            No immediate complications. Estimated Blood Loss:     Estimated blood loss was minimal. Estimated blood                            loss was minimal. Procedure:                Pre-Anesthesia Assessment:                           - Prior to the procedure, a History and Physical                            was performed, and patient medications and                            allergies were reviewed. The patient's tolerance of                            previous anesthesia was also reviewed. The risks                            and benefits of the procedure and the sedation                            options and risks were discussed with the patient.                            All questions were answered, and informed consent                            was obtained. Prior Anticoagulants: The patient has                            taken no previous anticoagulant or antiplatelet                            agents. ASA Grade Assessment: II - A patient with  mild systemic disease. After reviewing the risks                            and benefits, the patient was deemed in                            satisfactory condition to undergo the procedure.                           After obtaining informed consent, the colonoscope                            was passed under direct vision. Throughout the                            procedure,  the patient's blood pressure, pulse, and                            oxygen saturations were monitored continuously. The                            CF-HQ190L ([id number]) scope was introduced through                            the anus and advanced to the the cecum, identified                            by appendiceal orifice and ileocecal valve. The                            colonoscopy was performed without difficulty. The                            patient tolerated the procedure well. The quality                            of the bowel preparation was adequate. The                            ileocecal valve, appendiceal orifice, and rectum                            were photographed. Scope In: 9:02:14 AM Scope Out: 9:19:10 AM Scope Withdrawal Time: 0 hours 12 minutes 38 seconds  Total Procedure Duration: 0 hours 16 minutes 56 seconds  Findings:      The perianal and digital rectal examinations were normal.      Four sessile polyps were found in the rectum. The polyps were 5 to 6 mm       in size. These polyps were removed with a cold snare. Resection and       retrieval were complete. Estimated blood loss was minimal.      The exam was otherwise without abnormality on direct and retroflexion       views.      Scattered small and large-mouthed diverticula  were found in the entire       colon. Impression:               - Four 5 to 6 mm polyps in the rectum, removed with                            a cold snare. Resected and retrieved.                           - The examination was otherwise normal on direct                            and retroflexion views.                           - Diverticulosis in the entire examined colon. Moderate Sedation:      Moderate (conscious) sedation was personally administered by an       anesthesia professional. The following parameters were monitored: oxygen       saturation, heart rate, blood pressure, respiratory rate, EKG, adequacy       of  pulmonary ventilation, and response to care. Recommendation:           - Patient has a contact number available for                            emergencies. The signs and symptoms of potential                            delayed complications were discussed with the                            patient. Return to normal activities tomorrow.                            Written discharge instructions were provided to the                            patient.                           - Resume previous diet.                           - Continue present medications.                           - Repeat colonoscopy date to be determined after                            pending pathology results are reviewed for                            surveillance.                           - Return to GI office (date not yet determined). Procedure Code(s):        ---  Professional ---                           [phone], Colonoscopy, flexible; with removal of                            tumor(s), polyp(s), or other lesion(s) by snare                            technique Diagnosis Code(s):        --- Professional ---                           Z12.11, Encounter for screening for malignant                            neoplasm of colon                           K62.1, Rectal polyp                           K57.30, Diverticulosis of large intestine without                            perforation or abscess without bleeding CPT copyright 2019 American Medical Association. All rights reserved. The codes documented in this report are preliminary and upon coder review may  be revised to meet current compliance requirements. Cristopher Estimable. Tomeko Scoville, MD Norvel Richards, MD 01/11/2019 9:25:33 AM This report has been signed electronically. Number of Addenda: 0

## 2019-01-12 ENCOUNTER — Telehealth: Payer: Self-pay | Admitting: Family Medicine

## 2019-01-12 DIAGNOSIS — E785 Hyperlipidemia, unspecified: Secondary | ICD-10-CM

## 2019-01-12 DIAGNOSIS — R7301 Impaired fasting glucose: Secondary | ICD-10-CM

## 2019-01-12 DIAGNOSIS — Z79899 Other long term (current) drug therapy: Secondary | ICD-10-CM

## 2019-01-12 DIAGNOSIS — Z125 Encounter for screening for malignant neoplasm of prostate: Secondary | ICD-10-CM

## 2019-01-12 DIAGNOSIS — I10 Essential (primary) hypertension: Secondary | ICD-10-CM

## 2019-01-12 NOTE — Telephone Encounter (Signed)
Lab orders placed and pt is aware. Lab orders mailed to patient per pt request

## 2019-01-12 NOTE — Telephone Encounter (Signed)
Pt has appt 02/21/2019, would like labs ordered  Would like lipid, liver, CBC, PSA, & A1c  Please advise & call pt when done & mail orders to pt - he will get these done in [place] at [facility]

## 2019-01-12 NOTE — Telephone Encounter (Signed)
A1c lip liv m7 cbc psa

## 2019-01-12 NOTE — Telephone Encounter (Signed)
Last labs on 08/07/2018 were A1C and lipid. Please advise. Thank you

## 2019-01-13 ENCOUNTER — Encounter: Payer: Self-pay | Admitting: Internal Medicine

## 2019-01-19 ENCOUNTER — Encounter (HOSPITAL_COMMUNITY): Payer: Self-pay | Admitting: Internal Medicine

## 2019-01-31 MED FILL — LOSARTAN POTASSIUM 100 MG T: 100 | 30 days supply | Qty: 30 | Fill #2

## 2019-01-31 MED FILL — BUPROPION HCL SR 200 MG TAB: 200 | 90 days supply | Qty: 180 | Fill #1

## 2019-01-31 MED FILL — AMLODIPINE BESYLATE 5 MG TA: 5 | 90 days supply | Qty: 90 | Fill #1

## 2019-02-14 DIAGNOSIS — E785 Hyperlipidemia, unspecified: Secondary | ICD-10-CM | POA: Diagnosis not present

## 2019-02-14 DIAGNOSIS — R7301 Impaired fasting glucose: Secondary | ICD-10-CM | POA: Diagnosis not present

## 2019-02-14 DIAGNOSIS — Z79899 Other long term (current) drug therapy: Secondary | ICD-10-CM | POA: Diagnosis not present

## 2019-02-14 DIAGNOSIS — Z125 Encounter for screening for malignant neoplasm of prostate: Secondary | ICD-10-CM | POA: Diagnosis not present

## 2019-02-14 DIAGNOSIS — I10 Essential (primary) hypertension: Secondary | ICD-10-CM | POA: Diagnosis not present

## 2019-02-15 LAB — CBC WITH DIFFERENTIAL/PLATELET
Basophils Absolute: 0.1 10*3/uL (ref 0.0–0.2)
Basos: 1 %
EOS (ABSOLUTE): 0.2 10*3/uL (ref 0.0–0.4)
Eos: 2 %
Hematocrit: 45.4 % (ref 37.5–51.0)
Hemoglobin: 15.3 g/dL (ref 13.0–17.7)
Immature Grans (Abs): 0 10*3/uL (ref 0.0–0.1)
Immature Granulocytes: 1 %
Lymphocytes Absolute: 2.3 10*3/uL (ref 0.7–3.1)
Lymphs: 30 %
MCH: 31.5 pg (ref 26.6–33.0)
MCHC: 33.7 g/dL (ref 31.5–35.7)
MCV: 93 fL (ref 79–97)
Monocytes Absolute: 0.9 10*3/uL (ref 0.1–0.9)
Monocytes: 12 %
Neutrophils Absolute: 4.2 10*3/uL (ref 1.4–7.0)
Neutrophils: 54 %
Platelets: 234 10*3/uL (ref 150–450)
RBC: 4.86 x10E6/uL (ref 4.14–5.80)
RDW: 12.3 % (ref 11.6–15.4)
WBC: 7.6 10*3/uL (ref 3.4–10.8)

## 2019-02-15 LAB — HEPATIC FUNCTION PANEL
ALT: 64 IU/L — ABNORMAL HIGH (ref 0–44)
AST: 43 IU/L — ABNORMAL HIGH (ref 0–40)
Albumin: 4.9 g/dL (ref 3.8–4.9)
Alkaline Phosphatase: 57 IU/L (ref 39–117)
Bilirubin Total: 0.3 mg/dL (ref 0.0–1.2)
Bilirubin, Direct: 0.11 mg/dL (ref 0.00–0.40)
Total Protein: 6.6 g/dL (ref 6.0–8.5)

## 2019-02-15 LAB — BASIC METABOLIC PANEL
BUN/Creatinine Ratio: 17 (ref 9–20)
BUN: 19 mg/dL (ref 6–24)
CO2: 24 mmol/L (ref 20–29)
Calcium: 10.1 mg/dL (ref 8.7–10.2)
Chloride: 101 mmol/L (ref 96–106)
Creatinine, Ser: 1.12 mg/dL (ref 0.76–1.27)
GFR calc Af Amer: 86 mL/min/{1.73_m2} (ref >59)
GFR calc non Af Amer: 75 mL/min/{1.73_m2} (ref >59)
Glucose: 112 mg/dL — ABNORMAL HIGH (ref 65–99)
Potassium: 5.1 mmol/L (ref 3.5–5.2)
Sodium: 141 mmol/L (ref 134–144)

## 2019-02-15 LAB — HEMOGLOBIN A1C
Est. average glucose Bld gHb Est-mCnc: 117 mg/dL
Hgb A1c MFr Bld: 5.7 % — ABNORMAL HIGH (ref 4.8–5.6)

## 2019-02-15 LAB — PSA: Prostate Specific Ag, Serum: 1 ng/mL (ref 0.0–4.0)

## 2019-02-15 LAB — LIPID PANEL
Chol/HDL Ratio: 4 ratio (ref 0.0–5.0)
Cholesterol, Total: 200 mg/dL — ABNORMAL HIGH (ref 100–199)
HDL: 50 mg/dL (ref >39)
LDL Chol Calc (NIH): 133 mg/dL — ABNORMAL HIGH (ref 0–99)
Triglycerides: 94 mg/dL (ref 0–149)
VLDL Cholesterol Cal: 17 mg/dL (ref 5–40)

## 2019-02-21 ENCOUNTER — Encounter: Payer: Self-pay | Admitting: Family Medicine

## 2019-02-21 ENCOUNTER — Other Ambulatory Visit: Payer: Self-pay

## 2019-02-21 ENCOUNTER — Ambulatory Visit (INDEPENDENT_AMBULATORY_CARE_PROVIDER_SITE_OTHER): Payer: 59 | Admitting: Family Medicine

## 2019-02-21 VITALS — BP 138/90 | Temp 96.7°F | Wt 236.2 lb

## 2019-02-21 DIAGNOSIS — R748 Abnormal levels of other serum enzymes: Secondary | ICD-10-CM | POA: Diagnosis not present

## 2019-02-21 DIAGNOSIS — J45991 Cough variant asthma: Secondary | ICD-10-CM | POA: Diagnosis not present

## 2019-02-21 DIAGNOSIS — E785 Hyperlipidemia, unspecified: Secondary | ICD-10-CM | POA: Diagnosis not present

## 2019-02-21 DIAGNOSIS — I10 Essential (primary) hypertension: Secondary | ICD-10-CM | POA: Diagnosis not present

## 2019-02-21 DIAGNOSIS — F411 Generalized anxiety disorder: Secondary | ICD-10-CM | POA: Diagnosis not present

## 2019-02-21 MED ORDER — AMLODIPINE BESYLATE 5 MG PO TABS: 5.0000 mg | ORAL_TABLET | Freq: Every day | ORAL | 1 refills | Status: DC

## 2019-02-21 MED ORDER — VALSARTAN 160 MG PO TABS: 160.0000 mg | ORAL_TABLET | Freq: Every morning | ORAL | 1 refills | Status: DC

## 2019-02-21 MED ORDER — ESOMEPRAZOLE MAGNESIUM 40 MG PO CPDR: DELAYED_RELEASE_CAPSULE | ORAL | 1 refills | Status: DC

## 2019-02-21 MED ORDER — ALBUTEROL SULFATE HFA 108 (90 BASE) MCG/ACT IN AERS: 2.0000 | INHALATION_SPRAY | RESPIRATORY_TRACT | 5 refills | Status: DC | PRN

## 2019-02-21 MED ORDER — BUPROPION HCL ER (SR) 200 MG PO TB12: 200.0000 mg | ORAL_TABLET | Freq: Two times a day (BID) | ORAL | 1 refills | Status: DC

## 2019-02-21 MED ORDER — LOSARTAN POTASSIUM 100 MG PO TABS: 100.0000 mg | ORAL_TABLET | Freq: Every day | ORAL | 1 refills | Status: DC

## 2019-02-21 MED FILL — ALBUTEROL SULFATE HFA 108 (: 108 (90 BAS | 17 days supply | Qty: 18 | Fill #0

## 2019-02-21 MED FILL — ESOMEPRAZOLE MAG DR 40 MG C: 40 | 90 days supply | Qty: 180 | Fill #0

## 2019-02-21 NOTE — Progress Notes (Signed)
Subjective:  Patient presents with multiple concerns  Patient ID: Christopher Bowers, male    DOB: 03/07/1966, 53 y.o.   MRN: [id number]  Hypertension This is a chronic problem. (Pt had a colonoscopy last month and BP was up; retook BP and it was OK. ) There are no compliance problems.    Pt is taking Amlodipine and Losartan for BP. When Losartan is not available pt takes Valsartan.   Results for orders placed or performed in visit on 01/12/19  Hemoglobin A1c  Result Value Ref Range   Hgb A1c MFr Bld 5.7 (H) 4.8 - 5.6 %   Est. average glucose Bld gHb Est-mCnc 117 mg/dL  Lipid Profile  Result Value Ref Range   Cholesterol, Total 200 (H) 100 - 199 mg/dL   Triglycerides 94 0 - 149 mg/dL   HDL 50 >39 mg/dL   VLDL Cholesterol Cal 17 5 - 40 mg/dL   LDL Chol Calc (NIH) 133 (H) 0 - 99 mg/dL   Chol/HDL Ratio 4.0 0.0 - 5.0 ratio  Hepatic function panel  Result Value Ref Range   Total Protein 6.6 6.0 - 8.5 g/dL   Albumin 4.9 3.8 - 4.9 g/dL   Bilirubin Total 0.3 0.0 - 1.2 mg/dL   Bilirubin, Direct 0.11 0.00 - 0.40 mg/dL   Alkaline Phosphatase 57 39 - 117 IU/L   AST 43 (H) 0 - 40 IU/L   ALT 64 (H) 0 - 44 IU/L  Basic Metabolic Panel (BMET)  Result Value Ref Range   Glucose 112 (H) 65 - 99 mg/dL   BUN 19 6 - 24 mg/dL   Creatinine, Ser 1.12 0.76 - 1.27 mg/dL   GFR calc non Af Amer 75 >59 mL/min/1.73   GFR calc Af Amer 86 >59 mL/min/1.73   BUN/Creatinine Ratio 17 9 - 20   Sodium 141 134 - 144 mmol/L   Potassium 5.1 3.5 - 5.2 mmol/L   Chloride 101 96 - 106 mmol/L   CO2 24 20 - 29 mmol/L   Calcium 10.1 8.7 - 10.2 mg/dL  CBC with Differential  Result Value Ref Range   WBC 7.6 3.4 - 10.8 x10E3/uL   RBC 4.86 4.14 - 5.80 x10E6/uL   Hemoglobin 15.3 13.0 - 17.7 g/dL   Hematocrit 45.4 37.5 - 51.0 %   MCV 93 79 - 97 fL   MCH 31.5 26.6 - 33.0 pg   MCHC 33.7 31.5 - 35.7 g/dL   RDW 12.3 11.6 - 15.4 %   Platelets 234 150 - 450 x10E3/uL   Neutrophils 54 Not Estab. %   Lymphs 30 Not Estab. %   Monocytes 12 Not Estab. %   Eos 2 Not Estab. %   Basos 1 Not Estab. %   Neutrophils Absolute 4.2 1.4 - 7.0 x10E3/uL   Lymphocytes Absolute 2.3 0.7 - 3.1 x10E3/uL   Monocytes Absolute 0.9 0.1 - 0.9 x10E3/uL   EOS (ABSOLUTE) 0.2 0.0 - 0.4 x10E3/uL   Basophils Absolute 0.1 0.0 - 0.2 x10E3/uL   Immature Granulocytes 1 Not Estab. %   Immature Grans (Abs) 0.0 0.0 - 0.1 x10E3/uL  PSA  Result Value Ref Range   Prostate Specific Ag, Serum 1.0 0.0 - 4.0 ng/mL   Blood pressure medicine and blood pressure levels reviewed today with patient. Compliant with blood pressure medicine. States does not miss a dose. No obvious side effects. Blood pressure generally good when checked elsewhere. Watching salt intake.  Notes generalized anxiety is still an issue.  Compliant with his  medication.  Complicated by this spring with his wife's challenge working in a COVID-19 hospital.  States asthma overall in decent control.  Rare use of rescue inhaler.  Handling things well  When queried about elevated liver function test patient admitted to excessive alcohol abuse.  Often as many as 5 or 6 drinks per day on his wife is working 4 days/week mixed drinks vodka usually  Review of Systems No headache, no major weight loss or weight gain, no chest pain no back pain abdominal pain no change in bowel habits complete ROS otherwise negative     Objective:   Physical Exam  Alert and oriented, vitals reviewed and stable, NAD ENT-TM's and ext canals WNL bilat via otoscopic exam Soft palate, tonsils and post pharynx WNL via oropharyngeal exam Neck-symmetric, no masses; thyroid nonpalpable and nontender Pulmonary-no tachypnea or accessory muscle use; Clear without wheezes via auscultation Card--no abnrml murmurs, rhythm reg and rate WNL Carotid pulses symmetric, without bruits       Assessment & Plan:  Impression 1 hypertension.  Good control discussed to maintain same meds compliance discussed  2.  Chronic  generalized anxiety disorder.  Encouraged to maintain Wellbutrin regular exercise encouraged.  May have something to do with #3  3.  Increased alcohol use with now elevated liver enzymes.  Long discussion held.  Patient challenged to get down to no more than 1-2 drinks per day.  Repeat liver enzymes in 3 months  4.  Asthma clinically stable maintain same meds  Follow-up in 6 months diet exercise discussed wellness plus chronic then

## 2019-02-26 MED FILL — LOSARTAN POTASSIUM 100 MG T: 100 | 90 days supply | Qty: 90 | Fill #0

## 2019-03-15 MED FILL — LOSARTAN POTASSIUM 100 MG T: 100 | 30 days supply | Qty: 30 | Fill #3

## 2019-03-15 MED FILL — ESOMEPRAZOLE MAG DR 40 MG C: 40 | 90 days supply | Qty: 180 | Fill #0

## 2019-04-06 DIAGNOSIS — S39012A Strain of muscle, fascia and tendon of lower back, initial encounter: Secondary | ICD-10-CM | POA: Diagnosis not present

## 2019-04-11 DIAGNOSIS — D2262 Melanocytic nevi of left upper limb, including shoulder: Secondary | ICD-10-CM | POA: Diagnosis not present

## 2019-04-11 DIAGNOSIS — D225 Melanocytic nevi of trunk: Secondary | ICD-10-CM | POA: Diagnosis not present

## 2019-04-11 DIAGNOSIS — L814 Other melanin hyperpigmentation: Secondary | ICD-10-CM | POA: Diagnosis not present

## 2019-04-11 DIAGNOSIS — D2239 Melanocytic nevi of other parts of face: Secondary | ICD-10-CM | POA: Diagnosis not present

## 2019-04-11 DIAGNOSIS — D1801 Hemangioma of skin and subcutaneous tissue: Secondary | ICD-10-CM | POA: Diagnosis not present

## 2019-04-11 DIAGNOSIS — L820 Inflamed seborrheic keratosis: Secondary | ICD-10-CM | POA: Diagnosis not present

## 2019-04-11 DIAGNOSIS — I788 Other diseases of capillaries: Secondary | ICD-10-CM | POA: Diagnosis not present

## 2019-04-11 DIAGNOSIS — L821 Other seborrheic keratosis: Secondary | ICD-10-CM | POA: Diagnosis not present

## 2019-04-13 ENCOUNTER — Telehealth: Payer: Self-pay | Admitting: Family Medicine

## 2019-04-13 NOTE — Telephone Encounter (Signed)
Pt contacted and verbalize understanding.  

## 2019-04-13 NOTE — Telephone Encounter (Signed)
Please advise. Thank you

## 2019-04-13 NOTE — Telephone Encounter (Signed)
Wait for wife's result, if neg, no need to test, if pos would consider testing, but no need to do quickly if no symptoms, can wait til early next wk/ should maintain distance from wife while testing pending

## 2019-04-13 NOTE — Telephone Encounter (Signed)
Patient wife works at [employer] long and was expose to positive Covid patient and she was tested at [employer]. And he wants to know what to do about himself. 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 And they havent been for nine months. He wants to know if he needs to be tested or wait to see what his wife results are. Please advise

## 2019-05-07 MED FILL — AMLODIPINE BESYLATE 5 MG TA: 5 | 90 days supply | Qty: 90 | Fill #0

## 2019-05-07 MED FILL — LOSARTAN POTASSIUM 100 MG T: 100 | 30 days supply | Qty: 30 | Fill #4

## 2019-05-07 MED FILL — BUPROPION HCL SR 200 MG TAB: 200 | 90 days supply | Qty: 180 | Fill #0

## 2019-06-04 ENCOUNTER — Telehealth: Payer: Self-pay | Admitting: Nurse Practitioner

## 2019-06-04 DIAGNOSIS — R05 Cough: Secondary | ICD-10-CM | POA: Diagnosis not present

## 2019-06-04 DIAGNOSIS — Z20828 Contact with and (suspected) exposure to other viral communicable diseases: Secondary | ICD-10-CM | POA: Diagnosis not present

## 2019-06-04 NOTE — Telephone Encounter (Signed)
Called to Discuss with patient about Covid symptoms and the use of bamlanivimab, a monoclonal antibody infusion for those with mild to moderate Covid symptoms and at a high risk of hospitalization.     Patient does not meet criteria for infusion

## 2019-06-05 ENCOUNTER — Other Ambulatory Visit: Payer: 59

## 2019-06-18 ENCOUNTER — Ambulatory Visit (INDEPENDENT_AMBULATORY_CARE_PROVIDER_SITE_OTHER): Payer: 59 | Admitting: Family Medicine

## 2019-06-18 ENCOUNTER — Encounter: Payer: Self-pay | Admitting: Family Medicine

## 2019-06-18 ENCOUNTER — Other Ambulatory Visit: Payer: Self-pay

## 2019-06-18 DIAGNOSIS — U071 COVID-19: Secondary | ICD-10-CM | POA: Diagnosis not present

## 2019-06-18 NOTE — Progress Notes (Signed)
   Subjective:    Patient ID: Christopher Bowers, male    DOB: [birth year], 54 y.o.   MRN: [id number]  HPI Pt tested positive 2 weeks ago for COVID. Pt had test done at [facility] in [place] ([place] [facility]). Pt is just now getting taste and smell back, having chest congestion, wheezing and headache. Pt has been taking Tylenol morning and night. The NP at [facility] gave pt a steroid that he has began to take. No fever, no body aches. Pt is wanting to know when he can return to work.   Virtual Visit via Telephone Note  I connected with Christopher Bowers on 06/18/19 at  3:30 PM EST by telephone and verified that I am speaking with the correct person using two identifiers.  Location: Patient: home Provider: office   I discussed the limitations, risks, security and privacy concerns of performing an evaluation and management service by telephone and the availability of in person appointments. I also discussed with the patient that there may be a patient responsible charge related to this service. The patient expressed understanding and agreed to proceed.   History of Present Illness:    Observations/Objective:   Assessment and Plan:   Follow Up Instructions:    I discussed the assessment and treatment plan with the patient. The patient was provided an opportunity to ask questions and all were answered. The patient agreed with the plan and demonstrated an understanding of the instructions.   The patient was advised to call back or seek an in-person evaluation if the symptoms worsen or if the condition fails to improve as anticipated.  I provided 20 minutes of non-face-to-face time during this encounter.  Patient continues to have lingering symptoms. This is from the COVID-19 members confirmed via positive test. Ongoing cough. Ongoing fatigue. Was due to go back to work full-time today due to quarantine restrictions coming up today. However patient feels he simply is not able to do his usual job at  this time.   Using his inhaler a couple times a day the rescue inhaler. Uses this maintenance medication regularly  Review of Systems No high fevers no major shortness of breath no chest pain    Objective:   Physical Exam   Virtual     Assessment & Plan:  Impression status post COVID-19 infection. With ongoing symptomatology. And now worsening of reactive airways. Just started steroids has initiated by an urgent care doctor a week or so ago. Work excuse stretched out. Rationale discussed. Warning signs discussed

## 2019-06-19 MED FILL — LOSARTAN POTASSIUM 100 MG T: 100 | 30 days supply | Qty: 30 | Fill #0

## 2019-07-04 ENCOUNTER — Encounter: Payer: Self-pay | Admitting: Family Medicine

## 2019-07-16 MED FILL — LOSARTAN POTASSIUM 100 MG T: 100 | 90 days supply | Qty: 90 | Fill #0

## 2019-07-17 ENCOUNTER — Telehealth: Payer: Self-pay | Admitting: Family Medicine

## 2019-07-17 DIAGNOSIS — Z125 Encounter for screening for malignant neoplasm of prostate: Secondary | ICD-10-CM

## 2019-07-17 DIAGNOSIS — R748 Abnormal levels of other serum enzymes: Secondary | ICD-10-CM

## 2019-07-17 DIAGNOSIS — Z79899 Other long term (current) drug therapy: Secondary | ICD-10-CM

## 2019-07-17 DIAGNOSIS — I10 Essential (primary) hypertension: Secondary | ICD-10-CM

## 2019-07-17 DIAGNOSIS — E785 Hyperlipidemia, unspecified: Secondary | ICD-10-CM

## 2019-07-17 DIAGNOSIS — R7301 Impaired fasting glucose: Secondary | ICD-10-CM

## 2019-07-17 NOTE — Telephone Encounter (Signed)
Same plus tsh

## 2019-07-17 NOTE — Telephone Encounter (Signed)
Patient has physical in March and would like full work with labs ,he has labs in the system from September but he wants complete work up.

## 2019-07-17 NOTE — Telephone Encounter (Signed)
Last labs 01/2019: Lipid, Liver, Met 7, CBC, PSA, HgbA1c

## 2019-07-17 NOTE — Telephone Encounter (Signed)
Blood work ordered in Epic. Patient notified. 

## 2019-08-07 MED FILL — AMLODIPINE BESYLATE 5 MG TA: 5 | 90 days supply | Qty: 90 | Fill #0

## 2019-08-07 MED FILL — BUPROPION HCL SR 200 MG TAB: 200 | 90 days supply | Qty: 180 | Fill #0

## 2019-08-16 DIAGNOSIS — R748 Abnormal levels of other serum enzymes: Secondary | ICD-10-CM | POA: Diagnosis not present

## 2019-08-16 DIAGNOSIS — Z125 Encounter for screening for malignant neoplasm of prostate: Secondary | ICD-10-CM | POA: Diagnosis not present

## 2019-08-16 DIAGNOSIS — R7301 Impaired fasting glucose: Secondary | ICD-10-CM | POA: Diagnosis not present

## 2019-08-16 DIAGNOSIS — I10 Essential (primary) hypertension: Secondary | ICD-10-CM | POA: Diagnosis not present

## 2019-08-16 DIAGNOSIS — Z79899 Other long term (current) drug therapy: Secondary | ICD-10-CM | POA: Diagnosis not present

## 2019-08-16 DIAGNOSIS — E785 Hyperlipidemia, unspecified: Secondary | ICD-10-CM | POA: Diagnosis not present

## 2019-08-17 LAB — HEPATIC FUNCTION PANEL
ALT: 90 IU/L — ABNORMAL HIGH (ref 0–44)
AST: 61 IU/L — ABNORMAL HIGH (ref 0–40)
Albumin: 4.9 g/dL (ref 3.8–4.9)
Alkaline Phosphatase: 60 IU/L (ref 39–117)
Bilirubin Total: 0.5 mg/dL (ref 0.0–1.2)
Bilirubin, Direct: 0.18 mg/dL (ref 0.00–0.40)
Total Protein: 6.9 g/dL (ref 6.0–8.5)

## 2019-08-17 LAB — BASIC METABOLIC PANEL
BUN/Creatinine Ratio: 16 (ref 9–20)
BUN: 17 mg/dL (ref 6–24)
CO2: 21 mmol/L (ref 20–29)
Calcium: 9.7 mg/dL (ref 8.7–10.2)
Chloride: 103 mmol/L (ref 96–106)
Creatinine, Ser: 1.04 mg/dL (ref 0.76–1.27)
GFR calc Af Amer: 94 mL/min/{1.73_m2} (ref >59)
GFR calc non Af Amer: 82 mL/min/{1.73_m2} (ref >59)
Glucose: 120 mg/dL — ABNORMAL HIGH (ref 65–99)
Potassium: 5.2 mmol/L (ref 3.5–5.2)
Sodium: 141 mmol/L (ref 134–144)

## 2019-08-17 LAB — LIPID PANEL
Chol/HDL Ratio: 3.7 ratio (ref 0.0–5.0)
Cholesterol, Total: 199 mg/dL (ref 100–199)
HDL: 54 mg/dL (ref >39)
LDL Chol Calc (NIH): 129 mg/dL — ABNORMAL HIGH (ref 0–99)
Triglycerides: 88 mg/dL (ref 0–149)
VLDL Cholesterol Cal: 16 mg/dL (ref 5–40)

## 2019-08-17 LAB — CBC WITH DIFFERENTIAL/PLATELET
Basophils Absolute: 0 10*3/uL (ref 0.0–0.2)
Basos: 1 %
EOS (ABSOLUTE): 0 10*3/uL (ref 0.0–0.4)
Eos: 1 %
Hematocrit: 46.7 % (ref 37.5–51.0)
Hemoglobin: 15.8 g/dL (ref 13.0–17.7)
Immature Grans (Abs): 0 10*3/uL (ref 0.0–0.1)
Immature Granulocytes: 0 %
Lymphocytes Absolute: 1.9 10*3/uL (ref 0.7–3.1)
Lymphs: 26 %
MCH: 31.9 pg (ref 26.6–33.0)
MCHC: 33.8 g/dL (ref 31.5–35.7)
MCV: 94 fL (ref 79–97)
Monocytes Absolute: 1 10*3/uL — ABNORMAL HIGH (ref 0.1–0.9)
Monocytes: 14 %
Neutrophils Absolute: 4.2 10*3/uL (ref 1.4–7.0)
Neutrophils: 58 %
Platelets: 241 10*3/uL (ref 150–450)
RBC: 4.95 x10E6/uL (ref 4.14–5.80)
RDW: 13.1 % (ref 11.6–15.4)
WBC: 7.2 10*3/uL (ref 3.4–10.8)

## 2019-08-17 LAB — HEMOGLOBIN A1C
Est. average glucose Bld gHb Est-mCnc: 117 mg/dL
Hgb A1c MFr Bld: 5.7 % — ABNORMAL HIGH (ref 4.8–5.6)

## 2019-08-17 LAB — PSA: Prostate Specific Ag, Serum: 1.7 ng/mL (ref 0.0–4.0)

## 2019-08-22 ENCOUNTER — Ambulatory Visit (INDEPENDENT_AMBULATORY_CARE_PROVIDER_SITE_OTHER): Payer: 59 | Admitting: Family Medicine

## 2019-08-22 ENCOUNTER — Other Ambulatory Visit: Payer: Self-pay

## 2019-08-22 ENCOUNTER — Encounter: Payer: Self-pay | Admitting: Family Medicine

## 2019-08-22 VITALS — BP 138/84 | Temp 97.9°F | Ht 71.0 in | Wt 243.8 lb

## 2019-08-22 DIAGNOSIS — F411 Generalized anxiety disorder: Secondary | ICD-10-CM

## 2019-08-22 DIAGNOSIS — Z0001 Encounter for general adult medical examination with abnormal findings: Secondary | ICD-10-CM

## 2019-08-22 DIAGNOSIS — I10 Essential (primary) hypertension: Secondary | ICD-10-CM

## 2019-08-22 DIAGNOSIS — J45991 Cough variant asthma: Secondary | ICD-10-CM

## 2019-08-22 DIAGNOSIS — R748 Abnormal levels of other serum enzymes: Secondary | ICD-10-CM

## 2019-08-22 MED ORDER — FLUTICASONE PROPIONATE 50 MCG/ACT NA SUSP: 2.0000 | Freq: Every day | NASAL | 1 refills | Status: AC

## 2019-08-22 MED ORDER — MOMETASONE FURO-FORMOTEROL FUM 100-5 MCG/ACT IN AERO: 2.0000 | INHALATION_SPRAY | Freq: Two times a day (BID) | RESPIRATORY_TRACT | 5 refills | Status: DC | PRN

## 2019-08-22 MED ORDER — ESOMEPRAZOLE MAGNESIUM 40 MG PO CPDR: DELAYED_RELEASE_CAPSULE | ORAL | 1 refills | Status: DC

## 2019-08-22 MED ORDER — VALSARTAN 160 MG PO TABS: 160.0000 mg | ORAL_TABLET | Freq: Every morning | ORAL | 1 refills | Status: DC

## 2019-08-22 MED ORDER — ALBUTEROL SULFATE HFA 108 (90 BASE) MCG/ACT IN AERS: 2.0000 | INHALATION_SPRAY | RESPIRATORY_TRACT | 5 refills | Status: DC | PRN

## 2019-08-22 MED ORDER — BUPROPION HCL ER (SR) 200 MG PO TB12: 200.0000 mg | ORAL_TABLET | Freq: Two times a day (BID) | ORAL | 1 refills | Status: DC

## 2019-08-22 MED ORDER — AMLODIPINE BESYLATE 5 MG PO TABS: 5.0000 mg | ORAL_TABLET | Freq: Every day | ORAL | 1 refills | Status: DC

## 2019-08-22 MED ORDER — LOSARTAN POTASSIUM 100 MG PO TABS: 100.0000 mg | ORAL_TABLET | Freq: Every day | ORAL | 1 refills | Status: DC

## 2019-08-22 NOTE — Progress Notes (Signed)
Subjective:    Patient ID: Christopher Bowers, male    DOB: May 31, 1966, 54 y.o.   MRN: [id number]  HPI The patient comes in today for a wellness visit.    A review of their health history was completed.  A review of medications was also completed.  Any needed refills: yes chronic meds  Eating habits: good  Falls/  MVA accidents in past few months: none  Regular exercise: before covid, slowly getting back   Specialist pt sees on regular basis: none  Preventative health issues were discussed.   Additional concerns:  Wants to discuss blood work  Results for orders placed or performed in visit on 07/17/19  Lipid panel  Result Value Ref Range   Cholesterol, Total 199 100 - 199 mg/dL   Triglycerides 88 0 - 149 mg/dL   HDL 54 >39 mg/dL   VLDL Cholesterol Cal 16 5 - 40 mg/dL   LDL Chol Calc (NIH) 129 (H) 0 - 99 mg/dL   Chol/HDL Ratio 3.7 0.0 - 5.0 ratio  Hepatic function panel  Result Value Ref Range   Total Protein 6.9 6.0 - 8.5 g/dL   Albumin 4.9 3.8 - 4.9 g/dL   Bilirubin Total 0.5 0.0 - 1.2 mg/dL   Bilirubin, Direct 0.18 0.00 - 0.40 mg/dL   Alkaline Phosphatase 60 39 - 117 IU/L   AST 61 (H) 0 - 40 IU/L   ALT 90 (H) 0 - 44 IU/L  Basic metabolic panel  Result Value Ref Range   Glucose 120 (H) 65 - 99 mg/dL   BUN 17 6 - 24 mg/dL   Creatinine, Ser 1.04 0.76 - 1.27 mg/dL   GFR calc non Af Amer 82 >59 mL/min/1.73   GFR calc Af Amer 94 >59 mL/min/1.73   BUN/Creatinine Ratio 16 9 - 20   Sodium 141 134 - 144 mmol/L   Potassium 5.2 3.5 - 5.2 mmol/L   Chloride 103 96 - 106 mmol/L   CO2 21 20 - 29 mmol/L   Calcium 9.7 8.7 - 10.2 mg/dL  CBC with Differential/Platelet  Result Value Ref Range   WBC 7.2 3.4 - 10.8 x10E3/uL   RBC 4.95 4.14 - 5.80 x10E6/uL   Hemoglobin 15.8 13.0 - 17.7 g/dL   Hematocrit 46.7 37.5 - 51.0 %   MCV 94 79 - 97 fL   MCH 31.9 26.6 - 33.0 pg   MCHC 33.8 31.5 - 35.7 g/dL   RDW 13.1 11.6 - 15.4 %   Platelets 241 150 - 450 x10E3/uL   Neutrophils 58 Not  Estab. %   Lymphs 26 Not Estab. %   Monocytes 14 Not Estab. %   Eos 1 Not Estab. %   Basos 1 Not Estab. %   Neutrophils Absolute 4.2 1.4 - 7.0 x10E3/uL   Lymphocytes Absolute 1.9 0.7 - 3.1 x10E3/uL   Monocytes Absolute 1.0 (H) 0.1 - 0.9 x10E3/uL   EOS (ABSOLUTE) 0.0 0.0 - 0.4 x10E3/uL   Basophils Absolute 0.0 0.0 - 0.2 x10E3/uL   Immature Granulocytes 0 Not Estab. %   Immature Grans (Abs) 0.0 0.0 - 0.1 x10E3/uL  PSA  Result Value Ref Range   Prostate Specific Ag, Serum 1.7 0.0 - 4.0 ng/mL  Hemoglobin A1c  Result Value Ref Range   Hgb A1c MFr Bld 5.7 (H) 4.8 - 5.6 %   Est. average glucose Bld gHb Est-mCnc 117 mg/dL    Pt had covid back in the January   Blood pressure medicine and blood pressure levels  reviewed today with patient. Compliant with blood pressure medicine. States does not miss a dose. No obvious side effects. Blood pressure generally good when checked elsewhere. Watching salt intake.   Asthma, overall stbe on meds.  Compliant with medication.  Rare use of rescue inhaler.  Generalized anxiety disorder.  Does not miss a dose on his medication.  States he feels it definitely helps    Review of Systems No headache, no major weight loss or weight gain, no chest pain no back pain abdominal pain no change in bowel habits complete ROS otherwise negative     Objective:   Physical Exam Vitals reviewed.  Constitutional:      Appearance: He is well-developed.  HENT:     Head: Normocephalic and atraumatic.     Right Ear: External ear normal.     Left Ear: External ear normal.     Nose: Nose normal.  Eyes:     Pupils: Pupils are equal, round, and reactive to light.  Neck:     Thyroid: No thyromegaly.  Cardiovascular:     Rate and Rhythm: Normal rate and regular rhythm.     Heart sounds: Normal heart sounds. No murmur.  Pulmonary:     Effort: Pulmonary effort is normal. No respiratory distress.     Breath sounds: Normal breath sounds. No wheezing.  Abdominal:      General: Bowel sounds are normal. There is no distension.     Palpations: Abdomen is soft. There is no mass.     Tenderness: There is no abdominal tenderness.  Genitourinary:    Penis: Normal.   Musculoskeletal:        General: Normal range of motion.     Cervical back: Normal range of motion and neck supple.  Lymphadenopathy:     Cervical: No cervical adenopathy.  Skin:    General: Skin is warm and dry.     Findings: No erythema.  Neurological:     Mental Status: He is alert.     Motor: No abnormal muscle tone.  Psychiatric:        Behavior: Behavior normal.        Judgment: Judgment normal.           Assessment & Plan:  Impression 1 wellness exam.  Diet discussed.  Exercise discussed.  Vaccines discussed.  Up-to-date on colonoscopy.  2.  Generalized anxiety disorder.  Ongoing patient to maintain meds  3.  Prediabetes.  Diet discussed.  4.  Hypertension.  Overall good control discussed maintain same meds  5.  Elevated liver enzymes.  Patient to work hard on cutting down fats intake and alcohol intake and work harder on exercise, we will see what his liver enzymes look like in 6 months before pursuing further work-up.  Follow-up in 6 months.  Medications refilled diet exercise discussed

## 2019-09-27 MED FILL — ESOMEPRAZOLE MAG DR 40 MG C: 40 | 90 days supply | Qty: 180 | Fill #1

## 2019-11-09 ENCOUNTER — Telehealth: Payer: Self-pay | Admitting: *Deleted

## 2019-11-09 DIAGNOSIS — Z0001 Encounter for general adult medical examination with abnormal findings: Secondary | ICD-10-CM

## 2019-11-09 DIAGNOSIS — Z79899 Other long term (current) drug therapy: Secondary | ICD-10-CM

## 2019-11-09 DIAGNOSIS — I10 Essential (primary) hypertension: Secondary | ICD-10-CM

## 2019-11-09 DIAGNOSIS — E785 Hyperlipidemia, unspecified: Secondary | ICD-10-CM

## 2019-11-09 MED ORDER — AMLODIPINE BESYLATE 5 MG PO TABS: 5.0000 mg | ORAL_TABLET | Freq: Every day | ORAL | 0 refills | Status: DC

## 2019-11-09 NOTE — Telephone Encounter (Signed)
F/u appt in 9/21 for htn. Thx. Dr. Darene Lamer

## 2019-11-09 NOTE — Telephone Encounter (Signed)
Please schedule and then close message.

## 2019-11-12 NOTE — Telephone Encounter (Signed)
Scheduled 8/10. Does pt need lab work done before appt?

## 2019-11-12 NOTE — Telephone Encounter (Signed)
Pls order cbc, cmp.   Thx.   Dr. Lovena Le

## 2019-11-12 NOTE — Addendum Note (Signed)
Addended by: Vicente Males on: 11/12/2019 01:58 PM   Modules accepted: Orders

## 2019-11-12 NOTE — Telephone Encounter (Signed)
Last labs completed 08/16/19 A1C, PSA, CBC, Met 7, Hepatic and Lipid. Please advise. Thank you

## 2019-11-12 NOTE — Telephone Encounter (Signed)
Lab orders placed and pt is aware 

## 2019-11-21 ENCOUNTER — Other Ambulatory Visit: Payer: Self-pay | Admitting: Family Medicine

## 2019-11-21 ENCOUNTER — Telehealth: Payer: Self-pay | Admitting: Family Medicine

## 2019-11-21 MED ORDER — ALBUTEROL SULFATE HFA 108 (90 BASE) MCG/ACT IN AERS: 2.0000 | INHALATION_SPRAY | RESPIRATORY_TRACT | 2 refills | Status: DC | PRN

## 2019-11-21 NOTE — Telephone Encounter (Signed)
Patient is requesting refill on albuterol last filled 08/22/19 . He was last seen 08/22/19. He has scheduled appointment for 7/13. Ensign

## 2019-11-21 NOTE — Telephone Encounter (Signed)
Refills sent to pharmacy and pt is aware. Pt wanted to cancel July appt; has appt for 01/08/20

## 2019-11-21 NOTE — Telephone Encounter (Signed)
Yes, pls send refill for 3 months. Thx. Dr. Lovena Le

## 2019-11-21 NOTE — Telephone Encounter (Signed)
Please advise. Thank you

## 2019-12-11 ENCOUNTER — Ambulatory Visit: Payer: 59 | Admitting: Family Medicine

## 2019-12-17 ENCOUNTER — Other Ambulatory Visit: Payer: Self-pay

## 2019-12-17 ENCOUNTER — Other Ambulatory Visit: Payer: Self-pay | Admitting: Adult Health

## 2019-12-17 ENCOUNTER — Ambulatory Visit: Payer: 59 | Admitting: Adult Health

## 2019-12-17 ENCOUNTER — Ambulatory Visit (INDEPENDENT_AMBULATORY_CARE_PROVIDER_SITE_OTHER)
Admission: RE | Admit: 2019-12-17 | Discharge: 2019-12-17 | Disposition: A | Discharge status: Home / Self Care | Payer: 59 | Source: Ambulatory Visit | Attending: Adult Health | Admitting: Adult Health

## 2019-12-17 ENCOUNTER — Encounter: Payer: Self-pay | Admitting: Adult Health

## 2019-12-17 VITALS — BP 138/72 | HR 87 | Temp 98.4°F | Ht 71.0 in | Wt 242.8 lb

## 2019-12-17 DIAGNOSIS — R062 Wheezing: Secondary | ICD-10-CM

## 2019-12-17 DIAGNOSIS — R918 Other nonspecific abnormal finding of lung field: Secondary | ICD-10-CM | POA: Diagnosis not present

## 2019-12-17 DIAGNOSIS — R05 Cough: Secondary | ICD-10-CM | POA: Diagnosis not present

## 2019-12-17 DIAGNOSIS — J45991 Cough variant asthma: Secondary | ICD-10-CM | POA: Diagnosis not present

## 2019-12-17 DIAGNOSIS — R058 Other specified cough: Secondary | ICD-10-CM

## 2019-12-17 DIAGNOSIS — U071 COVID-19: Secondary | ICD-10-CM | POA: Diagnosis not present

## 2019-12-17 MED ORDER — BUDESONIDE-FORMOTEROL FUMARATE 80-4.5 MCG/ACT IN AERO: 2.0000 | INHALATION_SPRAY | Freq: Two times a day (BID) | RESPIRATORY_TRACT | 5 refills | Status: DC

## 2019-12-17 NOTE — Assessment & Plan Note (Signed)
Mild flare off of maintenance inhaler. Also has ongoing symptoms since COVID-19. Will check PFTs. Check chest x-ray. Restart maintenance inhaler LABA/ICS  Plan  Patient Instructions  Begin Symbicort 80 2 puffs Twice daily, rinse after use.  Begin Allegra 180mg  daily  Mucinex DM Twice daily   As needed   Chest xray today .   Follow up with in 4-6 weeks with PFT and As needed   Please contact office for sooner follow up if symptoms do not improve or worsen or seek emergency care.

## 2019-12-17 NOTE — Progress Notes (Signed)
@Patient  ID: Christopher Bowers, male    DOB: May 29, 1966, 54 y.o.   MRN: [id number]  Chief Complaint  Patient presents with  . Follow-up    post covid cough     Referring provider: Erven Colla, DO  HPI: 54 yo male never smoker seen previously for a chronic cough after ARDS related to severe H1N1 in February 2014. Yes TEST/EVENTS :   12/17/2019 Follow up: Cough  Patient returns for a follow-up visit for persistent cough. Patient was last seen in October 2018, he had been followed for a chronic cough after ARDS with critical illness and February 2014 when he had H1 N1 influenza.. Patient gradually made a full recovery chest x-ray showed clearance in September 2018. Unfortunately patient developed COVID-19 infection in January 2021. Patient said he had respiratory symptoms and since then he has had some intermittent shortness of breath with activity decreased activity tolerance postnasal drainage and mild intermittent dry cough. He had previously been on [facility] but has run out of inhalers and says it is no longer covered by his insurance. Patient says prior to Covid he was doing very well he was exercising on a regular basis had lost weight and had no respiratory issues. Was rarely using his Dulera prior to Covid. He denies any hemoptysis chest pain orthopnea PND calf pain or swelling. Denies any exertional chest pain.    Allergies  Allergen Reactions  . Augmentin [Amoxicillin-Pot Clavulanate] Rash    Immunization History  Administered Date(s) Administered  . Influenza Split 03/19/2013, 02/28/2014, 02/20/2018  . Influenza,inj,Quad PF,6+ Mos 02/20/2018  . Influenza-Unspecified 03/10/2015, 05/05/2016, 04/03/2019  . Pneumococcal Polysaccharide-23 05/09/2013    Past Medical History:  Diagnosis Date  . Carpal tunnel syndrome of right wrist   . GERD (gastroesophageal reflux disease)   . History of acute respiratory failure    Feb 2014  w/  CAP and ARDS  ---  resolved  . History of  exercise stress test    01-07-2010   -- negative for ischemia, no chest pain,  hypertensive response to exercise  . History of gastritis   . History of hiatal hernia   . Hypertension   . OSA (obstructive sleep apnea)    per pt study done 2013 OSA intolerant cpap  ,  pt uses oral appliance    Tobacco History: Social History   Tobacco Use  Smoking Status Never Smoker  Smokeless Tobacco Current User  . Types: Snuff  Tobacco Comment   "occasional snuff"   Ready to quit: Not Answered Counseling given: Not Answered Comment: "occasional snuff"   Outpatient Medications Prior to Visit  Medication Sig Dispense Refill  . albuterol (VENTOLIN HFA) 108 (90 Base) MCG/ACT inhaler Inhale 2 puffs into the lungs every 4 (four) hours as needed for wheezing or shortness of breath. 18 g 2  . amLODipine (NORVASC) 5 MG tablet Take 1 tablet (5 mg total) by mouth daily. 90 tablet 0  . buPROPion (WELLBUTRIN SR) 200 MG 12 hr tablet Take 1 tablet (200 mg total) by mouth 2 (two) times daily. 180 tablet 1  . Cholecalciferol (VITAMIN D3) 5000 UNITS TABS Take 5,000 Units by mouth daily. (when he remembers)    . esomeprazole (NEXIUM) 40 MG capsule TAKE 1 CAPSULE BY MOUTH TWICE DAILY BEFORE MEALS 180 capsule 1  . fluticasone (FLONASE) 50 MCG/ACT nasal spray Place 2 sprays into both nostrils daily. 16 g 1  . ibuprofen (ADVIL) 200 MG tablet Take 200 mg by mouth every 6 (six)  hours as needed.    Marland Kitchen losartan (COZAAR) 100 MG tablet Take 1 tablet (100 mg total) by mouth daily. 90 tablet 1  . valsartan (DIOVAN) 160 MG tablet Take 1 tablet (160 mg total) by mouth every morning. 90 tablet 1  . mometasone-formoterol (DULERA) 100-5 MCG/ACT AERO Inhale 2 puffs into the lungs 2 (two) times daily as needed. (Patient not taking: Reported on 12/17/2019) 13 g 5   No facility-administered medications prior to visit.     Review of Systems:   Constitutional:   No  weight loss, night sweats,  Fevers, chills,  +fatigue, or   lassitude.  HEENT:   No headaches,  Difficulty swallowing,  Tooth/dental problems, or  Sore throat,                No sneezing, itching, ear ache, nasal congestion, post nasal drip,   CV:  No chest pain,  Orthopnea, PND, swelling in lower extremities, anasarca, dizziness, palpitations, syncope.   GI  No heartburn, indigestion, abdominal pain, nausea, vomiting, diarrhea, change in bowel habits, loss of appetite, bloody stools.   Resp:  .  No chest wall deformity  Skin: no rash or lesions.  GU: no dysuria, change in color of urine, no urgency or frequency.  No flank pain, no hematuria   MS:  No joint pain or swelling.  No decreased range of motion.  No back pain.    Physical Exam  BP 138/72 (BP Location: Left Arm, Cuff Size: Normal)   Pulse 87   Temp 98.4 F (36.9 C) (Oral)   Ht 5\' 11"  (1.803 m)   Wt 242 lb 12.8 oz (110.1 kg)   SpO2 97%   BMI 33.86 kg/m   GEN: A/Ox3; pleasant , NAD, well nourished    HEENT:  [place]/AT,  EACs-clear, TMs-wnl, NOSE-clear, THROAT-clear, no lesions, no postnasal drip or exudate noted.   NECK:  Supple w/ fair ROM; no JVD; normal carotid impulses w/o bruits; no thyromegaly or nodules palpated; no lymphadenopathy.    RESP  Clear  P & A; w/o, wheezes/ rales/ or rhonchi. no accessory muscle use, no dullness to percussion  CARD:  RRR, no m/r/g, no peripheral edema, pulses intact, no cyanosis or clubbing.  GI:   Soft & nt; nml bowel sounds; no organomegaly or masses detected.   Musco: Warm bil, no deformities or joint swelling noted.   Neuro: alert, no focal deficits noted.    Skin: Warm, no lesions or rashes    Lab Results:  CBC    Component Value Date/Time   WBC 7.2 08/16/2019 0953   WBC 10.9 (H) 03/22/2017 1710   RBC 4.95 08/16/2019 0953   RBC 5.20 03/22/2017 1710   HGB 15.8 08/16/2019 0953   HCT 46.7 08/16/2019 0953   PLT 241 08/16/2019 0953   MCV 94 08/16/2019 0953   MCH 31.9 08/16/2019 0953   MCH 31.0 09/26/2014 0740   MCHC 33.8  08/16/2019 0953   MCHC 33.6 03/22/2017 1710   RDW 13.1 08/16/2019 0953   LYMPHSABS 1.9 08/16/2019 0953   MONOABS 1.0 03/22/2017 1710   EOSABS 0.0 08/16/2019 0953   BASOSABS 0.0 08/16/2019 0953    BMET    Component Value Date/Time   NA 141 08/16/2019 0953   K 5.2 08/16/2019 0953   CL 103 08/16/2019 0953   CO2 21 08/16/2019 0953   GLUCOSE 120 (H) 08/16/2019 0953   GLUCOSE 134 (H) 05/01/2015 1146   BUN 17 08/16/2019 0953   CREATININE 1.04 08/16/2019  6967   CREATININE 1.16 03/14/2013 1236   CALCIUM 9.7 08/16/2019 0953   GFRNONAA 82 08/16/2019 0953   GFRAA 94 08/16/2019 0953    BNP    Component Value Date/Time   BNP 17.0 09/26/2014 0740    ProBNP No results found for: PROBNP  Imaging: No results found.    No flowsheet data found.  Lab Results  Component Value Date   NITRICOXIDE 108 03/22/2017        Assessment & Plan:   Cough variant asthma Mild flare off of maintenance inhaler. Also has ongoing symptoms since COVID-19. Will check PFTs. Check chest x-ray. Restart maintenance inhaler LABA/ICS  Plan  Patient Instructions  Begin Symbicort 80 2 puffs Twice daily, rinse after use.  Begin Allegra 180mg  daily  Mucinex DM Twice daily   As needed   Chest xray today .   Follow up with in 4-6 weeks with PFT and As needed   Please contact office for sooner follow up if symptoms do not improve or worsen or seek emergency care.       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  Begin Allegra 180mg  daily  Mucinex DM Twice daily   As needed   Chest xray today .   Follow up with in 4-6 weeks with PFT and As needed   Please contact office for sooner follow up if symptoms do not improve or worsen or seek emergency care.          Rexene Edison, NP 12/17/2019

## 2019-12-17 NOTE — Patient Instructions (Addendum)
Begin Symbicort 80 2 puffs Twice daily, rinse after use.  Begin Allegra 180mg  daily  Mucinex DM Twice daily   As needed   Chest xray today .   Follow up with in 4-6 weeks with PFT and As needed   Please contact office for sooner follow up if symptoms do not improve or worsen or seek emergency care.

## 2019-12-17 NOTE — Assessment & Plan Note (Signed)
Control for triggers   Plan  Patient Instructions  Begin Symbicort 80 2 puffs Twice daily, rinse after use.  Begin Allegra 180mg  daily  Mucinex DM Twice daily   As needed   Chest xray today .   Follow up with in 4-6 weeks with PFT and As needed   Please contact office for sooner follow up if symptoms do not improve or worsen or seek emergency care.

## 2020-01-04 DIAGNOSIS — Z0001 Encounter for general adult medical examination with abnormal findings: Secondary | ICD-10-CM | POA: Diagnosis not present

## 2020-01-04 DIAGNOSIS — Z20828 Contact with and (suspected) exposure to other viral communicable diseases: Secondary | ICD-10-CM | POA: Diagnosis not present

## 2020-01-04 DIAGNOSIS — Z79899 Other long term (current) drug therapy: Secondary | ICD-10-CM | POA: Diagnosis not present

## 2020-01-04 DIAGNOSIS — I10 Essential (primary) hypertension: Secondary | ICD-10-CM | POA: Diagnosis not present

## 2020-01-04 DIAGNOSIS — E785 Hyperlipidemia, unspecified: Secondary | ICD-10-CM | POA: Diagnosis not present

## 2020-01-05 LAB — CBC WITH DIFFERENTIAL/PLATELET
Basophils Absolute: 0 10*3/uL (ref 0.0–0.2)
Basos: 1 %
EOS (ABSOLUTE): 0.1 10*3/uL (ref 0.0–0.4)
Eos: 2 %
Hematocrit: 45.5 % (ref 37.5–51.0)
Hemoglobin: 15.1 g/dL (ref 13.0–17.7)
Immature Grans (Abs): 0 10*3/uL (ref 0.0–0.1)
Immature Granulocytes: 0 %
Lymphocytes Absolute: 2 10*3/uL (ref 0.7–3.1)
Lymphs: 26 %
MCH: 30.9 pg (ref 26.6–33.0)
MCHC: 33.2 g/dL (ref 31.5–35.7)
MCV: 93 fL (ref 79–97)
Monocytes Absolute: 1 10*3/uL — ABNORMAL HIGH (ref 0.1–0.9)
Monocytes: 12 %
Neutrophils Absolute: 4.7 10*3/uL (ref 1.4–7.0)
Neutrophils: 59 %
Platelets: 226 10*3/uL (ref 150–450)
RBC: 4.88 x10E6/uL (ref 4.14–5.80)
RDW: 12.6 % (ref 11.6–15.4)
WBC: 7.9 10*3/uL (ref 3.4–10.8)

## 2020-01-05 LAB — COMPREHENSIVE METABOLIC PANEL
ALT: 93 IU/L — ABNORMAL HIGH (ref 0–44)
AST: 68 IU/L — ABNORMAL HIGH (ref 0–40)
Albumin/Globulin Ratio: 2.7 — ABNORMAL HIGH (ref 1.2–2.2)
Albumin: 4.8 g/dL (ref 3.8–4.9)
Alkaline Phosphatase: 58 IU/L (ref 48–121)
BUN/Creatinine Ratio: 16 (ref 9–20)
BUN: 18 mg/dL (ref 6–24)
Bilirubin Total: 0.7 mg/dL (ref 0.0–1.2)
CO2: 22 mmol/L (ref 20–29)
Calcium: 9.2 mg/dL (ref 8.7–10.2)
Chloride: 102 mmol/L (ref 96–106)
Creatinine, Ser: 1.14 mg/dL (ref 0.76–1.27)
GFR calc Af Amer: 84 mL/min/{1.73_m2} (ref >59)
GFR calc non Af Amer: 73 mL/min/{1.73_m2} (ref >59)
Globulin, Total: 1.8 g/dL (ref 1.5–4.5)
Glucose: 127 mg/dL — ABNORMAL HIGH (ref 65–99)
Potassium: 4.1 mmol/L (ref 3.5–5.2)
Sodium: 138 mmol/L (ref 134–144)
Total Protein: 6.6 g/dL (ref 6.0–8.5)

## 2020-01-08 ENCOUNTER — Other Ambulatory Visit: Payer: Self-pay

## 2020-01-08 ENCOUNTER — Encounter: Payer: Self-pay | Admitting: Family Medicine

## 2020-01-08 ENCOUNTER — Ambulatory Visit: Payer: 59 | Admitting: Family Medicine

## 2020-01-08 ENCOUNTER — Other Ambulatory Visit: Payer: Self-pay | Admitting: Family Medicine

## 2020-01-08 VITALS — BP 128/88 | HR 94 | Temp 97.7°F | Wt 250.8 lb

## 2020-01-08 DIAGNOSIS — R7301 Impaired fasting glucose: Secondary | ICD-10-CM | POA: Diagnosis not present

## 2020-01-08 DIAGNOSIS — R748 Abnormal levels of other serum enzymes: Secondary | ICD-10-CM | POA: Diagnosis not present

## 2020-01-08 DIAGNOSIS — F325 Major depressive disorder, single episode, in full remission: Secondary | ICD-10-CM | POA: Diagnosis not present

## 2020-01-08 DIAGNOSIS — Z8616 Personal history of COVID-19: Secondary | ICD-10-CM | POA: Diagnosis not present

## 2020-01-08 DIAGNOSIS — K760 Fatty (change of) liver, not elsewhere classified: Secondary | ICD-10-CM

## 2020-01-08 MED ORDER — BUPROPION HCL ER (SR) 200 MG PO TB12: 200.0000 mg | ORAL_TABLET | Freq: Two times a day (BID) | ORAL | 1 refills | Status: DC

## 2020-01-08 MED ORDER — LOSARTAN POTASSIUM 100 MG PO TABS: 100.0000 mg | ORAL_TABLET | Freq: Every day | ORAL | 1 refills | Status: DC

## 2020-01-08 MED ORDER — AMLODIPINE BESYLATE 5 MG PO TABS: 5.0000 mg | ORAL_TABLET | Freq: Every day | ORAL | 1 refills | Status: DC

## 2020-01-08 NOTE — Progress Notes (Signed)
Patient ID: Christopher Bowers, male    DOB: 1965/11/01, 53 y.o.   MRN: [id number]   Chief Complaint  Patient presents with  . Hypertension  . Anxiety   Subjective:   HPI-  Pt with past h/o Flu with pneumonia, with Acute resp failure and required intubation. Since then breathing issues over last 7 yrs, intermittently. Pt was having some increase in wheezing last 2-3 wks. Pt went to pulmonologist 2 wks ago and they though allergy related.  Taking allegra and symbicort and albuterol.  Wheezing improved.   gerd- taking nexium.  Used to be on [place] and insurance wasn't going to cover it. Was 7 yrs ago. Last 3-4 yrs didn't have dulera.   Had covid on 06/04/19. Had some sob and wheezing since then.    Gained some weight from 236 lbs to 250 now. Pt stating going through a lot of stressors this past year with pandemic, selling/buying house and some deaths in the family. Feels like the wellbutrin has been working well.    Has elevated LFTs over last few years.  Has had u/s ruq in 2017.  Told he has fatty liver disease.  Medical History Christopher Bowers has a past medical history of Carpal tunnel syndrome of right wrist, GERD (gastroesophageal reflux disease), History of acute respiratory failure, History of exercise stress test, History of gastritis, History of hiatal hernia, Hypertension, and OSA (obstructive sleep apnea).   Outpatient Encounter Medications as of 01/08/2020  Medication Sig  . albuterol (VENTOLIN HFA) 108 (90 Base) MCG/ACT inhaler Inhale 2 puffs into the lungs every 4 (four) hours as needed for wheezing or shortness of breath.  Marland Kitchen amLODipine (NORVASC) 5 MG tablet Take 1 tablet (5 mg total) by mouth daily.  . budesonide-formoterol (SYMBICORT) 80-4.5 MCG/ACT inhaler Inhale 2 puffs into the lungs in the morning and at bedtime.  . budesonide-formoterol (SYMBICORT) 80-4.5 MCG/ACT inhaler Inhale 2 puffs into the lungs in the morning and at bedtime.  Marland Kitchen buPROPion (WELLBUTRIN SR) 200 MG 12 hr  tablet Take 1 tablet (200 mg total) by mouth 2 (two) times daily.  . Cholecalciferol (VITAMIN D3) 5000 UNITS TABS Take 5,000 Units by mouth daily. (when he remembers)  . esomeprazole (NEXIUM) 40 MG capsule TAKE 1 CAPSULE BY MOUTH TWICE DAILY BEFORE MEALS  . fluticasone (FLONASE) 50 MCG/ACT nasal spray Place 2 sprays into both nostrils daily.  Marland Kitchen ibuprofen (ADVIL) 200 MG tablet Take 200 mg by mouth every 6 (six) hours as needed.  Marland Kitchen losartan (COZAAR) 100 MG tablet Take 1 tablet (100 mg total) by mouth daily.  . valsartan (DIOVAN) 160 MG tablet Take 1 tablet (160 mg total) by mouth every morning.  . [DISCONTINUED] amLODipine (NORVASC) 5 MG tablet Take 1 tablet (5 mg total) by mouth daily.  . [DISCONTINUED] buPROPion (WELLBUTRIN SR) 200 MG 12 hr tablet Take 1 tablet (200 mg total) by mouth 2 (two) times daily.  . [DISCONTINUED] losartan (COZAAR) 100 MG tablet Take 1 tablet (100 mg total) by mouth daily.  . [DISCONTINUED] mometasone-formoterol (DULERA) 100-5 MCG/ACT AERO Inhale 2 puffs into the lungs 2 (two) times daily as needed. (Patient not taking: Reported on 12/17/2019)   No facility-administered encounter medications on file as of 01/08/2020.     Review of Systems  Constitutional: Negative for chills and fever.  HENT: Negative for congestion, rhinorrhea and sore throat.   Respiratory: Negative for cough, shortness of breath and wheezing.   Cardiovascular: Negative for chest pain and leg swelling.  Gastrointestinal: Negative for  abdominal pain, diarrhea, nausea and vomiting.  Genitourinary: Negative for dysuria and frequency.  Skin: Negative for rash.  Neurological: Negative for dizziness, weakness and headaches.     Vitals BP 128/88   Pulse 94   Temp 97.7 F (36.5 C)   Wt 250 lb 12.8 oz (113.8 kg)   SpO2 99%   BMI 34.98 kg/m   Objective:   Physical Exam Vitals and nursing note reviewed.  Constitutional:      General: He is not in acute distress.    Appearance: Normal  appearance. He is not ill-appearing.  HENT:     Head: Normocephalic.     Nose: Nose normal. No congestion.     Mouth/Throat:     Mouth: Mucous membranes are moist.     Pharynx: No oropharyngeal exudate.  Eyes:     Extraocular Movements: Extraocular movements intact.     Conjunctiva/sclera: Conjunctivae normal.     Pupils: Pupils are equal, round, and reactive to light.  Cardiovascular:     Rate and Rhythm: Normal rate and regular rhythm.     Pulses: Normal pulses.     Heart sounds: Normal heart sounds. No murmur heard.   Pulmonary:     Effort: Pulmonary effort is normal.     Breath sounds: Normal breath sounds. No wheezing, rhonchi or rales.  Musculoskeletal:        General: Normal range of motion.     Right lower leg: No edema.     Left lower leg: No edema.  Skin:    General: Skin is warm and dry.     Findings: No rash.  Neurological:     General: No focal deficit present.     Mental Status: He is alert and oriented to person, place, and time.     Cranial Nerves: No cranial nerve deficit.  Psychiatric:        Mood and Affect: Mood normal.        Behavior: Behavior normal.        Thought Content: Thought content normal.        Judgment: Judgment normal.      Assessment and Plan   1. Impaired fasting blood sugar  2. History of COVID-19  3. Elevated liver enzymes  4. Fatty liver  5. Major depressive disorder with single episode, in remission (Christopher Bowers)   Fatty liver disease- elevated LFTs again.  Advising to work on weight loss of 5-10% to help with liver. Had u/s ruq liver- history fatty liver disease.  In 2017.  htn- sub optimal.  on losartan and amlodipine for bp.  Taking both in am. Used to take valsartan when pharmacy out of losartan.  H/o covid 19- improving and saw pulm recently and treated for allergies and given symbicort.  Depression/anxiety- taking wellbutrin.  Stable. Cont meds.  Impaired fasting bg- cont to monitor.  advising to dec carbs and inc in  exercising. recheck labs next visit.  F/u 7mo.

## 2020-01-22 DIAGNOSIS — J0110 Acute frontal sinusitis, unspecified: Secondary | ICD-10-CM | POA: Diagnosis not present

## 2020-01-22 DIAGNOSIS — Z20822 Contact with and (suspected) exposure to covid-19: Secondary | ICD-10-CM | POA: Diagnosis not present

## 2020-01-22 MED FILL — ESOMEPRAZOLE MAG DR 40 MG C: 40 | 90 days supply | Qty: 180 | Fill #0

## 2020-01-29 MED FILL — BUPROPION HCL SR 200 MG TAB: 200 | 90 days supply | Qty: 180 | Fill #1

## 2020-01-29 MED FILL — SYMBICORT 80-4.5 MCG INH: 80-4.5 | 30 days supply | Qty: 10 | Fill #0

## 2020-01-29 MED FILL — ALBUTEROL SULFATE HFA 108 (: 108 (90 BAS | 16 days supply | Qty: 18 | Fill #0

## 2020-01-29 MED FILL — AMLODIPINE BESYLATE 5 MG TA: 5 | 90 days supply | Qty: 90 | Fill #1

## 2020-02-11 MED FILL — LOSARTAN POTASSIUM 100 MG T: 100 | 90 days supply | Qty: 90 | Fill #0

## 2020-03-27 MED FILL — SYMBICORT 80-4.5 MCG INH: 80-4.5 | 30 days supply | Qty: 10 | Fill #1

## 2020-03-27 MED FILL — ALBUTEROL SULFATE HFA 108 (: 108 (90 BAS | 16 days supply | Qty: 18 | Fill #1

## 2020-04-30 MED FILL — AMLODIPINE BESYLATE 5 MG TA: 5 | 90 days supply | Qty: 90 | Fill #0

## 2020-04-30 MED FILL — LOSARTAN POTASSIUM 100 MG T: 100 | 90 days supply | Qty: 90 | Fill #1

## 2020-04-30 MED FILL — BUPROPION HCL SR 200 MG TAB: 200 | 90 days supply | Qty: 180 | Fill #0

## 2020-05-20 MED FILL — SYMBICORT 80-4.5 MCG INH: 80-4.5 | 30 days supply | Qty: 10 | Fill #2

## 2020-06-02 DIAGNOSIS — J069 Acute upper respiratory infection, unspecified: Secondary | ICD-10-CM | POA: Diagnosis not present

## 2020-06-02 DIAGNOSIS — Z20822 Contact with and (suspected) exposure to covid-19: Secondary | ICD-10-CM | POA: Diagnosis not present

## 2020-06-04 DIAGNOSIS — J069 Acute upper respiratory infection, unspecified: Secondary | ICD-10-CM | POA: Diagnosis not present

## 2020-06-04 DIAGNOSIS — J019 Acute sinusitis, unspecified: Secondary | ICD-10-CM | POA: Diagnosis not present

## 2020-06-05 ENCOUNTER — Telehealth: Payer: Self-pay | Admitting: Internal Medicine

## 2020-06-05 MED ORDER — PREDNISONE 10 MG PO TABS
ORAL_TABLET | ORAL | 0 refills | Status: AC
Start: 2020-06-05 — End: 2020-06-11

## 2020-06-05 NOTE — Telephone Encounter (Signed)
Even if this is pneumonia (or covid with a false neg test) it's being treated as well as I can do from here for now   If wheezing / sob can add Prednisone 10 mg take  4 each am x 2 days,   2 each am x 2 days,  1 each am x 2 days and stop   If worse or sats dropping < 90% would need to go to ER   F/u needed here in 2 weeks to be sure all better

## 2020-06-05 NOTE — Telephone Encounter (Signed)
Called and spoke with patient, advised of recommendations per Dr. Sherene Sires.  He verbalized understanding.  No appointments available with Dr. Sherene Sires for 2 weeks, he has seen TP in the past, scheduled f/u with TP for 06/19/2020.  Prednisone script sent to pharmacy specified by patient.  Nothing further needed.

## 2020-06-05 NOTE — Telephone Encounter (Signed)
Called spoke with patient.  He states he didn't feel good on Monday 06/02/20 , had a covid and flu swab done. Both came back negative. On Monday he was put on a Prednisone taper and is down to 10mg  daily starting today. He did an e-visit with his primary care yesterday and they gave him Levaquin. Patient has not had an xray but believes he has pneumonia.   Dr. please advise on further recommendations.

## 2020-06-19 ENCOUNTER — Ambulatory Visit (INDEPENDENT_AMBULATORY_CARE_PROVIDER_SITE_OTHER): Payer: 59 | Admitting: Adult Health

## 2020-06-19 ENCOUNTER — Telehealth: Payer: Self-pay | Admitting: Adult Health

## 2020-06-19 ENCOUNTER — Encounter: Payer: Self-pay | Admitting: Adult Health

## 2020-06-19 ENCOUNTER — Other Ambulatory Visit: Payer: Self-pay

## 2020-06-19 ENCOUNTER — Ambulatory Visit (INDEPENDENT_AMBULATORY_CARE_PROVIDER_SITE_OTHER): Payer: 59

## 2020-06-19 VITALS — BP 118/74 | HR 74 | Temp 98.3°F | Ht 71.0 in | Wt 256.0 lb

## 2020-06-19 DIAGNOSIS — R059 Cough, unspecified: Secondary | ICD-10-CM | POA: Diagnosis not present

## 2020-06-19 DIAGNOSIS — R062 Wheezing: Secondary | ICD-10-CM

## 2020-06-19 DIAGNOSIS — J45991 Cough variant asthma: Secondary | ICD-10-CM

## 2020-06-19 DIAGNOSIS — Z23 Encounter for immunization: Secondary | ICD-10-CM | POA: Diagnosis not present

## 2020-06-19 NOTE — Assessment & Plan Note (Signed)
Recent flare with acute asthmatic bronchitic exacerbation Reported COVID and flu test were negative. Check chest x-ray today. Patient appears to be clinically improving. Advised to use Mucinex DM. Use Symbicort on a consistent basis. Follow-up here in 6 weeks with pulmonary function testing.  Plan  Patient Instructions  Continue on Symbicort 2 puffs Twice daily, rinse after use.  Albuterol inhaler  Mucinex DM Twice daily   As needed   Chest xray today Flu shot today .  Follow up with in 4-6 weeks with PFT with Dr. Melvyn Novas  Or Dalton Mille NP and As needed   Please contact office for sooner follow up if symptoms do not improve or worsen or seek emergency care.

## 2020-06-19 NOTE — Progress Notes (Signed)
@Patient  ID: Christopher Bowers, male    DOB: [birth year], 55 y.o.   MRN: [id number]  Chief Complaint  Patient presents with  . Bronchitis    Referring provider: Erven Colla, DO  HPI: 55 year old male never smoker seen previously for chronic cough after ARDS related to severe H1 N1 in February 2014 COVID-19 infection January 2021 OSA -oral appliance   TEST/EVENTS :   06/19/2020 Follow up :: Bronchitis  Patient presents for a 2-week follow-up.  Patient had called in on January 6 with increased cough congestion and generalized.  COVID and flu test were negative. Patient was started on Levaquin and a prednisone taper by his primary care provider.  Patient says he is feeling better with decreased cough and congestion.  But continues to have some ongoing cough. Feels that he is 95% better. Patient denies any hemoptysis chest pain orthopnea PND or increased leg swelling. Remains on Symbicort Twice daily. Is using albuterol on occasion. Says he tries to use Symbicort consistently but sometimes does not take on a regular basis. Patient's wife is noticed some intermittent wheezing at bedtime. Patient does have sleep apnea CPAP intolerant. Uses oral appliance for sleep. Patient denies any significant sinus drainage or reflux.   Allergies  Allergen Reactions  . Augmentin [Amoxicillin-Pot Clavulanate] Rash    Immunization History  Administered Date(s) Administered  . Influenza Split 03/19/2013, 02/28/2014, 02/20/2018  . Influenza,inj,Quad PF,6+ Mos 02/20/2018  . Influenza-Unspecified 03/10/2015, 05/05/2016, 04/03/2019  . Moderna Sars-Covid-2 Vaccination 02/28/2020, 03/17/2020  . Pneumococcal Polysaccharide-23 05/09/2013    Past Medical History:  Diagnosis Date  . Carpal tunnel syndrome of right wrist   . GERD (gastroesophageal reflux disease)   . History of acute respiratory failure    Feb 2014  w/  CAP and ARDS  ---  resolved  . History of exercise stress test    01-07-2010   --  negative for ischemia, no chest pain,  hypertensive response to exercise  . History of gastritis   . History of hiatal hernia   . Hypertension   . OSA (obstructive sleep apnea)    per pt study done 2013 OSA intolerant cpap  ,  pt uses oral appliance    Tobacco History: Social History   Tobacco Use  Smoking Status Never Smoker  Smokeless Tobacco Current User  . Types: Snuff  Tobacco Comment   "occasional snuff"   Ready to quit: Not Answered Counseling given: Not Answered Comment: "occasional snuff"   Outpatient Medications Prior to Visit  Medication Sig Dispense Refill  . albuterol (VENTOLIN HFA) 108 (90 Base) MCG/ACT inhaler Inhale 2 puffs into the lungs every 4 (four) hours as needed for wheezing or shortness of breath. 18 g 2  . amLODipine (NORVASC) 5 MG tablet Take 1 tablet (5 mg total) by mouth daily. 90 tablet 1  . budesonide-formoterol (SYMBICORT) 80-4.5 MCG/ACT inhaler Inhale 2 puffs into the lungs in the morning and at bedtime. 1 Inhaler 5  . budesonide-formoterol (SYMBICORT) 80-4.5 MCG/ACT inhaler Inhale 2 puffs into the lungs in the morning and at bedtime. 1 Inhaler 5  . buPROPion (WELLBUTRIN SR) 200 MG 12 hr tablet Take 1 tablet (200 mg total) by mouth 2 (two) times daily. 180 tablet 1  . Cholecalciferol (VITAMIN D3) 5000 UNITS TABS Take 5,000 Units by mouth daily. (when he remembers)    . esomeprazole (NEXIUM) 40 MG capsule TAKE 1 CAPSULE BY MOUTH TWICE DAILY BEFORE MEALS 180 capsule 1  . fluticasone (FLONASE) 50 MCG/ACT nasal spray  Place 2 sprays into both nostrils daily. 16 g 1  . ibuprofen (ADVIL) 200 MG tablet Take 200 mg by mouth every 6 (six) hours as needed.    Marland Kitchen losartan (COZAAR) 100 MG tablet Take 1 tablet (100 mg total) by mouth daily. 90 tablet 1  . valsartan (DIOVAN) 160 MG tablet Take 1 tablet (160 mg total) by mouth every morning. (Patient not taking: Reported on 06/19/2020) 90 tablet 1   No facility-administered medications prior to visit.     Review  of Systems:   Constitutional:   No  weight loss, night sweats,  Fevers, chills,  +fatigue, or  lassitude.  HEENT:   No headaches,  Difficulty swallowing,  Tooth/dental problems, or  Sore throat,                No sneezing, itching, ear ache,  nasal congestion, post nasal drip,   CV:  No chest pain,  Orthopnea, PND, swelling in lower extremities, anasarca, dizziness, palpitations, syncope.   GI  No heartburn, indigestion, abdominal pain, nausea, vomiting, diarrhea, change in bowel habits, loss of appetite, bloody stools.   Resp:  No chest wall deformity  Skin: no rash or lesions.  GU: no dysuria, change in color of urine, no urgency or frequency.  No flank pain, no hematuria   MS:  No joint pain or swelling.  No decreased range of motion.  No back pain.    Physical Exam  BP 118/74 (BP Location: Left Arm, Cuff Size: Large)   Pulse 74   Temp 98.3 F (36.8 C)   Ht 5\' 11"  (1.803 m)   Wt 256 lb (116.1 kg)   SpO2 97%   BMI 35.70 kg/m   GEN: A/Ox3; pleasant , NAD, well nourished    HEENT:  [place]/AT,   NOSE-clear, THROAT-clear, no lesions, no postnasal drip or exudate noted.   NECK:  Supple w/ fair ROM; no JVD; normal carotid impulses w/o bruits; no thyromegaly or nodules palpated; no lymphadenopathy.    RESP  Clear  P & A; w/o, wheezes/ rales/ or rhonchi. no accessory muscle use, no dullness to percussion  CARD:  RRR, no m/r/g, no peripheral edema, pulses intact, no cyanosis or clubbing.  GI:   Soft & nt; nml bowel sounds; no organomegaly or masses detected.   Musco: Warm bil, no deformities or joint swelling noted.   Neuro: alert, no focal deficits noted.    Skin: Warm, no lesions or rashes    Lab Results:  CBC   ProBNP No results found for: PROBNP  Imaging: No results found.    No flowsheet data found.      Assessment & Plan:   Cough variant asthma Recent flare with acute asthmatic bronchitic exacerbation Reported COVID and flu test were negative.  Check chest x-ray today. Patient appears to be clinically improving. Advised to use Mucinex DM. Use Symbicort on a consistent basis. Follow-up here in 6 weeks with pulmonary function testing.  Plan  Patient Instructions  Continue on Symbicort 2 puffs Twice daily, rinse after use.  Albuterol inhaler  Mucinex DM Twice daily   As needed   Chest xray today Flu shot today .  Follow up with in 4-6 weeks with PFT with Dr. Melvyn Novas  Or Karra Pink NP and As needed   Please contact office for sooner follow up if symptoms do not improve or worsen or seek emergency care.          Rexene Edison, NP 06/19/2020

## 2020-06-19 NOTE — Patient Instructions (Addendum)
Continue on Symbicort 2 puffs Twice daily, rinse after use.  Albuterol inhaler  Mucinex DM Twice daily   As needed   Chest xray today Flu shot today .  Follow up with in 4-6 weeks with PFT with Dr. Melvyn Novas  Or Marchelle Rinella NP and As needed   Please contact office for sooner follow up if symptoms do not improve or worsen or seek emergency care.

## 2020-06-19 NOTE — Telephone Encounter (Signed)
Spoke with the pt's spouse  Unfortunately there is no DPR stating that we can speak with her  She was notified if pt wishes her can fill out DPR when comes for appt  Nothing further needed

## 2020-07-02 ENCOUNTER — Telehealth: Payer: Self-pay | Admitting: Family Medicine

## 2020-07-02 DIAGNOSIS — Z79899 Other long term (current) drug therapy: Secondary | ICD-10-CM

## 2020-07-02 DIAGNOSIS — I10 Essential (primary) hypertension: Secondary | ICD-10-CM

## 2020-07-02 DIAGNOSIS — Z125 Encounter for screening for malignant neoplasm of prostate: Secondary | ICD-10-CM

## 2020-07-02 DIAGNOSIS — E785 Hyperlipidemia, unspecified: Secondary | ICD-10-CM

## 2020-07-02 NOTE — Telephone Encounter (Signed)
Pls order cbc, cmp, lipids, psa.   Thx. D.r Oluwaferanmi Wain

## 2020-07-02 NOTE — Telephone Encounter (Signed)
Last labs 01/04/20 cmp, cbc

## 2020-07-02 NOTE — Telephone Encounter (Signed)
Patient is wanting know if he needs labs done. Call him on this number [phone]

## 2020-07-03 ENCOUNTER — Other Ambulatory Visit: Payer: Self-pay | Admitting: Family Medicine

## 2020-07-03 MED FILL — SYMBICORT 80-4.5 MCG INH: 80-4.5 | 30 days supply | Qty: 10 | Fill #3

## 2020-07-03 NOTE — Telephone Encounter (Signed)
Orders put in and left message to return call  

## 2020-07-03 NOTE — Telephone Encounter (Signed)
Last visit 01/08/20. Has upcoming appt on 2/9

## 2020-07-03 NOTE — Telephone Encounter (Signed)
Patient notified and verbalized understanding. 

## 2020-07-04 ENCOUNTER — Other Ambulatory Visit: Payer: Self-pay | Admitting: Family Medicine

## 2020-07-04 DIAGNOSIS — Z125 Encounter for screening for malignant neoplasm of prostate: Secondary | ICD-10-CM | POA: Diagnosis not present

## 2020-07-04 DIAGNOSIS — I10 Essential (primary) hypertension: Secondary | ICD-10-CM | POA: Diagnosis not present

## 2020-07-04 DIAGNOSIS — Z79899 Other long term (current) drug therapy: Secondary | ICD-10-CM | POA: Diagnosis not present

## 2020-07-04 DIAGNOSIS — E785 Hyperlipidemia, unspecified: Secondary | ICD-10-CM | POA: Diagnosis not present

## 2020-07-04 MED FILL — ALBUTEROL SULFATE HFA 108 (: 108 (90 BAS | 16 days supply | Qty: 18 | Fill #0

## 2020-07-05 LAB — COMPREHENSIVE METABOLIC PANEL
ALT: 83 IU/L — ABNORMAL HIGH (ref 0–44)
AST: 77 IU/L — ABNORMAL HIGH (ref 0–40)
Albumin/Globulin Ratio: 1.9 (ref 1.2–2.2)
Albumin: 4.6 g/dL (ref 3.8–4.9)
Alkaline Phosphatase: 69 IU/L (ref 44–121)
BUN/Creatinine Ratio: 16 (ref 9–20)
BUN: 18 mg/dL (ref 6–24)
Bilirubin Total: 0.7 mg/dL (ref 0.0–1.2)
CO2: 20 mmol/L (ref 20–29)
Calcium: 9.7 mg/dL (ref 8.7–10.2)
Chloride: 100 mmol/L (ref 96–106)
Creatinine, Ser: 1.15 mg/dL (ref 0.76–1.27)
GFR calc Af Amer: 83 mL/min/{1.73_m2} (ref >59)
GFR calc non Af Amer: 72 mL/min/{1.73_m2} (ref >59)
Globulin, Total: 2.4 g/dL (ref 1.5–4.5)
Glucose: 139 mg/dL — ABNORMAL HIGH (ref 65–99)
Potassium: 4.9 mmol/L (ref 3.5–5.2)
Sodium: 142 mmol/L (ref 134–144)
Total Protein: 7 g/dL (ref 6.0–8.5)

## 2020-07-05 LAB — CBC WITH DIFFERENTIAL/PLATELET
Basophils Absolute: 0.1 10*3/uL (ref 0.0–0.2)
Basos: 1 %
EOS (ABSOLUTE): 0.1 10*3/uL (ref 0.0–0.4)
Eos: 1 %
Hematocrit: 44.1 % (ref 37.5–51.0)
Hemoglobin: 15.1 g/dL (ref 13.0–17.7)
Immature Grans (Abs): 0.1 10*3/uL (ref 0.0–0.1)
Immature Granulocytes: 1 %
Lymphocytes Absolute: 2.5 10*3/uL (ref 0.7–3.1)
Lymphs: 25 %
MCH: 32.3 pg (ref 26.6–33.0)
MCHC: 34.2 g/dL (ref 31.5–35.7)
MCV: 94 fL (ref 79–97)
Monocytes Absolute: 1.6 10*3/uL — ABNORMAL HIGH (ref 0.1–0.9)
Monocytes: 15 %
Neutrophils Absolute: 5.8 10*3/uL (ref 1.4–7.0)
Neutrophils: 57 %
Platelets: 218 10*3/uL (ref 150–450)
RBC: 4.68 x10E6/uL (ref 4.14–5.80)
RDW: 12.2 % (ref 11.6–15.4)
WBC: 10.2 10*3/uL (ref 3.4–10.8)

## 2020-07-05 LAB — PSA: Prostate Specific Ag, Serum: 2 ng/mL (ref 0.0–4.0)

## 2020-07-05 LAB — LIPID PANEL
Chol/HDL Ratio: 4.5 ratio (ref 0.0–5.0)
Cholesterol, Total: 225 mg/dL — ABNORMAL HIGH (ref 100–199)
HDL: 50 mg/dL (ref >39)
LDL Chol Calc (NIH): 155 mg/dL — ABNORMAL HIGH (ref 0–99)
Triglycerides: 109 mg/dL (ref 0–149)
VLDL Cholesterol Cal: 20 mg/dL (ref 5–40)

## 2020-07-09 ENCOUNTER — Encounter: Payer: Self-pay | Admitting: Family Medicine

## 2020-07-09 ENCOUNTER — Other Ambulatory Visit: Payer: Self-pay | Admitting: Family Medicine

## 2020-07-09 ENCOUNTER — Other Ambulatory Visit: Payer: Self-pay

## 2020-07-09 ENCOUNTER — Ambulatory Visit: Payer: 59 | Admitting: Family Medicine

## 2020-07-09 ENCOUNTER — Other Ambulatory Visit (HOSPITAL_COMMUNITY): Payer: Self-pay | Admitting: Family Medicine

## 2020-07-09 VITALS — BP 132/78 | HR 88 | Temp 97.7°F | Ht 71.0 in | Wt 257.8 lb

## 2020-07-09 DIAGNOSIS — F411 Generalized anxiety disorder: Secondary | ICD-10-CM

## 2020-07-09 DIAGNOSIS — I10 Essential (primary) hypertension: Secondary | ICD-10-CM | POA: Diagnosis not present

## 2020-07-09 DIAGNOSIS — E785 Hyperlipidemia, unspecified: Secondary | ICD-10-CM | POA: Diagnosis not present

## 2020-07-09 DIAGNOSIS — F325 Major depressive disorder, single episode, in full remission: Secondary | ICD-10-CM | POA: Diagnosis not present

## 2020-07-09 DIAGNOSIS — R748 Abnormal levels of other serum enzymes: Secondary | ICD-10-CM | POA: Diagnosis not present

## 2020-07-09 MED ORDER — BUPROPION HCL ER (SR) 200 MG PO TB12: 200.0000 mg | ORAL_TABLET | Freq: Two times a day (BID) | ORAL | 1 refills | Status: DC

## 2020-07-09 MED ORDER — SERTRALINE HCL 50 MG PO TABS: ORAL_TABLET | ORAL | 1 refills | Status: DC

## 2020-07-09 MED ORDER — AMLODIPINE BESYLATE 5 MG PO TABS
5.0000 mg | ORAL_TABLET | Freq: Every day | ORAL | 1 refills | Status: DC
Start: 2020-07-09 — End: 2021-02-04

## 2020-07-09 MED ORDER — LOSARTAN POTASSIUM 100 MG PO TABS: 100.0000 mg | ORAL_TABLET | Freq: Every day | ORAL | 1 refills | Status: DC

## 2020-07-09 MED ORDER — ESOMEPRAZOLE MAGNESIUM 40 MG PO CPDR: DELAYED_RELEASE_CAPSULE | ORAL | 1 refills | Status: DC

## 2020-07-09 MED FILL — SERTRALINE HCL 50 MG TABLET: 50 | 30 days supply | Qty: 30 | Fill #0

## 2020-07-09 NOTE — Progress Notes (Signed)
Patient ID: Christopher Bowers, male    DOB: Sep 06, 1965, 55 y.o.   MRN: [id number]   Chief Complaint  Patient presents with  . Hypertension   Subjective:    HPI   Pt here for 6 month follow up on blood pressure. Pt checks blood pressure at home. No symptoms. Pt would like to talk to provider regarding Wellbutrin.  Past year with Covid and lots of changes at home and family. Has been on wellbutrin for years and not feeling it's working well. Had tried 10mg  lexapro in the past.  Pt felt "off" and jittery and anxious. Taking 300mg  xr wellbutrin.  Son graduated from [employer].  Son dx with type 1 diabetes at 11yrs old.  2012, since then pt had depression/anxiety. Moved in with father  in past year, but recently moved back to [place].  Took 1 mo or more feeling that he moved out.  Pt wondering has covid fatigue. Jan '22 had a pneumonia in first week. Had it treated.  Went to pulm, to f/u and xray ws clear.  With covid has put on some weight about 25 lbs.  Does go play gold with son. And new house early [place]. Lots of maintenance on yard.  Making him feel anxious.  Pt noticing drinking more. And elevated liver enzymes.  can have some hypoglycemia.  occ checking his glucose 89 after working in yard. occ seeing 90-110s.   Medical History Reegan has a past medical history of Carpal tunnel syndrome of right wrist, GERD (gastroesophageal reflux disease), History of acute respiratory failure, History of exercise stress test, History of gastritis, History of hiatal hernia, Hypertension, and OSA (obstructive sleep apnea).   Outpatient Encounter Medications as of 07/09/2020  Medication Sig  . albuterol (VENTOLIN HFA) 108 (90 Base) MCG/ACT inhaler INHALE 2 PUFFS INTO THE LUNGS EVERY 4 (FOUR) HOURS AS NEEDED FOR WHEEZING OR SHORTNESS OF BREATH.  Marland Kitchen amLODipine (NORVASC) 5 MG tablet Take 1 tablet (5 mg total) by mouth daily.  . budesonide-formoterol (SYMBICORT) 80-4.5 MCG/ACT inhaler Inhale 2 puffs into  the lungs in the morning and at bedtime.  Marland Kitchen buPROPion (WELLBUTRIN SR) 200 MG 12 hr tablet Take 1 tablet (200 mg total) by mouth 2 (two) times daily.  . Cholecalciferol (VITAMIN D3) 5000 UNITS TABS Take 5,000 Units by mouth daily. (when he remembers)  . esomeprazole (NEXIUM) 40 MG capsule TAKE 1 CAPSULE BY MOUTH TWICE DAILY BEFORE MEALS  . fluticasone (FLONASE) 50 MCG/ACT nasal spray Place 2 sprays into both nostrils daily.  Marland Kitchen ibuprofen (ADVIL) 200 MG tablet Take 200 mg by mouth every 6 (six) hours as needed.  Marland Kitchen losartan (COZAAR) 100 MG tablet Take 1 tablet (100 mg total) by mouth daily.  . sertraline (ZOLOFT) 50 MG tablet Take 1/2 tab p.o. for 14 days, then increase to 1 tab daily.  . valsartan (DIOVAN) 160 MG tablet Take 1 tablet (160 mg total) by mouth every morning.  . [DISCONTINUED] budesonide-formoterol (SYMBICORT) 80-4.5 MCG/ACT inhaler Inhale 2 puffs into the lungs in the morning and at bedtime.   No facility-administered encounter medications on file as of 07/09/2020.     Review of Systems  Constitutional: Negative for chills and fever.  HENT: Negative for congestion, rhinorrhea and sore throat.   Respiratory: Negative for cough, shortness of breath and wheezing.   Cardiovascular: Negative for chest pain and leg swelling.  Gastrointestinal: Negative for abdominal pain, diarrhea, nausea and vomiting.  Genitourinary: Negative for dysuria and frequency.  Skin: Negative  for rash.  Neurological: Negative for dizziness, weakness and headaches.  Psychiatric/Behavioral: Positive for dysphoric mood. Negative for self-injury, sleep disturbance and suicidal ideas. The patient is nervous/anxious.      Vitals BP 132/78   Pulse 88   Temp 97.7 F (36.5 C)   Ht 5\' 11"  (1.803 m)   Wt 257 lb 12.8 oz (116.9 kg)   SpO2 98%   BMI 35.96 kg/m   Objective:   Physical Exam Vitals and nursing note reviewed.  Constitutional:      General: He is not in acute distress.    Appearance: Normal  appearance. He is not ill-appearing.  HENT:     Head: Normocephalic.     Nose: Nose normal.     Mouth/Throat:     Mouth: Mucous membranes are moist.  Eyes:     Extraocular Movements: Extraocular movements intact.     Conjunctiva/sclera: Conjunctivae normal.     Pupils: Pupils are equal, round, and reactive to light.  Cardiovascular:     Rate and Rhythm: Normal rate and regular rhythm.     Pulses: Normal pulses.     Heart sounds: Normal heart sounds. No murmur heard.   Pulmonary:     Effort: Pulmonary effort is normal.     Breath sounds: Normal breath sounds. No wheezing, rhonchi or rales.  Musculoskeletal:        General: Normal range of motion.     Right lower leg: No edema.     Left lower leg: No edema.  Skin:    General: Skin is warm and dry.     Findings: No rash.  Neurological:     General: No focal deficit present.     Mental Status: He is alert and oriented to person, place, and time.     Cranial Nerves: No cranial nerve deficit.  Psychiatric:        Mood and Affect: Mood normal.        Behavior: Behavior normal.        Thought Content: Thought content normal.        Judgment: Judgment normal.      Assessment and Plan   1. Essential hypertension  2. Hyperlipidemia LDL goal <130  3. Elevated liver enzymes  4. Generalized anxiety disorder  5. Major depressive disorder with single episode, in remission ([place])   htn- stable. Cont to dec salt in diet and inc in exercising. Pt is  either taking valsartan 160mg  or losartan 100mg  depending on what is available at pharmacy.  Elevated glucose 137.  Pt not having good diet and drinking more alcohol.  With covid was eating more.  Elevated LFTs- discussed diet and dec alcohol intake. Increase in exercising.  Depression/anxiety- suboptimal. Recommending starting zoloft 25mg  for 2 wks then inc to 50mg  daily. Cont with wellbutrin.  F/u 4 wks for anxiety/depression and in 54mo for htn/glucose/liver enzymes.

## 2020-07-30 ENCOUNTER — Telehealth: Payer: Self-pay | Admitting: Internal Medicine

## 2020-07-31 NOTE — Telephone Encounter (Signed)
Called pt & left him a vm letting him know Resp Therapy schedules pft's at [facility] and at [facility].  They will also schedule covid but covid can only be done at [facility] if procedure is done at [facility].  Gave pt phone # to Resp Therapy.  I called Resp Therapy & spoke to [facility] and asked her to call pt to schedule.  Nothing further needed.

## 2020-08-06 ENCOUNTER — Other Ambulatory Visit: Payer: Self-pay | Admitting: Family Medicine

## 2020-08-06 ENCOUNTER — Telehealth (INDEPENDENT_AMBULATORY_CARE_PROVIDER_SITE_OTHER): Payer: 59 | Admitting: Family Medicine

## 2020-08-06 ENCOUNTER — Other Ambulatory Visit: Payer: Self-pay

## 2020-08-06 DIAGNOSIS — F411 Generalized anxiety disorder: Secondary | ICD-10-CM

## 2020-08-06 DIAGNOSIS — F320 Major depressive disorder, single episode, mild: Secondary | ICD-10-CM

## 2020-08-06 MED ORDER — SERTRALINE HCL 100 MG PO TABS: 100.0000 mg | ORAL_TABLET | Freq: Every day | ORAL | 2 refills | Status: DC

## 2020-08-06 MED FILL — SYMBICORT 80-4.5 MCG INH: 80-4.5 | 30 days supply | Qty: 10 | Fill #4

## 2020-08-06 MED FILL — BUPROPION HCL SR 200 MG TAB: 200 | 90 days supply | Qty: 180 | Fill #1

## 2020-08-06 MED FILL — ALBUTEROL SULFATE HFA 108 (: 108 (90 BAS | 16 days supply | Qty: 18 | Fill #1

## 2020-08-06 NOTE — Progress Notes (Signed)
Patient ID: Christopher Bowers, male    DOB: 1966/05/02, 55 y.o.   MRN: [id number]   Virtual Visit via Telephone Note  I connected with Christopher Bowers on 08/06/20 at  1:10 PM EST by telephone and verified that I am speaking with the correct person using two identifiers.  Location: Patient: home  Provider: office   I discussed the limitations, risks, security and privacy concerns of performing an evaluation and management service by telephone and the availability of in person appointments. I also discussed with the patient that there may be a patient responsible charge related to this service. The patient expressed understanding and agreed to proceed.   Chief Complaint  Patient presents with  . Depression   Subjective:    HPI  follow up on anxiety and depression. Father passed away a couple of weeks ago. phq9 and gad 7 done.   Father had covid and had passed 2 wks ago due to complications from covid.  Pt started 1/2 tab 50mg  zoloft for 2 wks then inc to 1 tab daily.  Now having slight jitteriness, but very slight.  Taking wellbutrin 200mg  bid, been on this for years since at least 2014.  Took a week off from work.  Went back to work and feeling okay.  Had service for father last weekend.   Feeling melancholy at times when driving to work.  Not sure if feeling his anxiety/depression as controlled.    Medical History Christopher Bowers has a past medical history of Carpal tunnel syndrome of right wrist, GERD (gastroesophageal reflux disease), History of acute respiratory failure, History of exercise stress test, History of gastritis, History of hiatal hernia, Hypertension, and OSA (obstructive sleep apnea).   Outpatient Encounter Medications as of 08/06/2020  Medication Sig  . albuterol (VENTOLIN HFA) 108 (90 Base) MCG/ACT inhaler INHALE 2 PUFFS INTO THE LUNGS EVERY 4 (FOUR) HOURS AS NEEDED FOR WHEEZING OR SHORTNESS OF BREATH.  Marland Kitchen amLODipine (NORVASC) 5 MG tablet Take 1 tablet (5 mg total) by mouth  daily.  . budesonide-formoterol (SYMBICORT) 80-4.5 MCG/ACT inhaler Inhale 2 puffs into the lungs in the morning and at bedtime.  Marland Kitchen buPROPion (WELLBUTRIN SR) 200 MG 12 hr tablet Take 1 tablet (200 mg total) by mouth 2 (two) times daily.  . Cholecalciferol (VITAMIN D3) 5000 UNITS TABS Take 5,000 Units by mouth daily. (when he remembers)  . esomeprazole (NEXIUM) 40 MG capsule TAKE 1 CAPSULE BY MOUTH TWICE DAILY BEFORE MEALS  . fluticasone (FLONASE) 50 MCG/ACT nasal spray Place 2 sprays into both nostrils daily.  Marland Kitchen ibuprofen (ADVIL) 200 MG tablet Take 200 mg by mouth every 6 (six) hours as needed.  Marland Kitchen losartan (COZAAR) 100 MG tablet Take 1 tablet (100 mg total) by mouth daily.  . [DISCONTINUED] sertraline (ZOLOFT) 50 MG tablet Take 1/2 tab p.o. for 14 days, then increase to 1 tab daily.  . sertraline (ZOLOFT) 100 MG tablet Take 1 tablet (100 mg total) by mouth daily.  . valsartan (DIOVAN) 160 MG tablet Take 1 tablet (160 mg total) by mouth every morning. (Patient not taking: Reported on 08/06/2020)   No facility-administered encounter medications on file as of 08/06/2020.     Review of Systems  Constitutional: Negative for chills and fever.  HENT: Negative for congestion, rhinorrhea and sore throat.   Respiratory: Negative for cough, shortness of breath and wheezing.   Cardiovascular: Negative for chest pain and leg swelling.  Gastrointestinal: Negative for abdominal pain, diarrhea, nausea and vomiting.  Genitourinary: Negative for  dysuria and frequency.  Skin: Negative for rash.  Neurological: Negative for dizziness, weakness and headaches.  Psychiatric/Behavioral: Positive for dysphoric mood. Negative for self-injury, sleep disturbance and suicidal ideas. The patient is not nervous/anxious.      Vitals There were no vitals taken for this visit.  Objective:   Physical Exam  No PE due to phone visit.  Assessment and Plan   1. Current mild episode of major depressive disorder,  unspecified whether recurrent (HCC) - sertraline (ZOLOFT) 100 MG tablet; Take 1 tablet (100 mg total) by mouth daily.  Dispense: 30 tablet; Refill: 2  2. Generalized anxiety disorder - sertraline (ZOLOFT) 100 MG tablet; Take 1 tablet (100 mg total) by mouth daily.  Dispense: 30 tablet; Refill: 2   Anxiety/depression- with recent passing of father.  Feeling not as controlled with depression. Pt would like to try to increase to 100mg  zoloft from the 50mg .  Will call if having side effects or any adverse reactions. Pt to all or rto if worsening symptoms.  Return in about 3 months (around 11/06/2020) for f/u anxiety, htn.    Follow Up Instructions:    I discussed the assessment and treatment plan with the patient. The patient was provided an opportunity to ask questions and all were answered. The patient agreed with the plan and demonstrated an understanding of the instructions.   The patient was advised to call back or seek an in-person evaluation if the symptoms worsen or if the condition fails to improve as anticipated.  I provided 15 minutes of non-face-to-face time during this encounter.

## 2020-08-08 MED FILL — AMLODIPINE BESYLATE 5 MG TA: 5 | 90 days supply | Qty: 90 | Fill #1

## 2020-08-08 MED FILL — LOSARTAN POTASSIUM 100 MG T: 100 | 30 days supply | Qty: 30 | Fill #0

## 2020-08-08 MED FILL — SERTRALINE HCL 100 MG TABS: 100 | 30 days supply | Qty: 30 | Fill #0

## 2020-08-15 ENCOUNTER — Other Ambulatory Visit: Payer: Self-pay

## 2020-08-15 ENCOUNTER — Other Ambulatory Visit (HOSPITAL_COMMUNITY)
Admission: RE | Admit: 2020-08-15 | Discharge: 2020-08-15 | Disposition: A | Discharge status: Home / Self Care | Payer: 59 | Source: Ambulatory Visit | Attending: Family Medicine | Admitting: Family Medicine

## 2020-08-15 DIAGNOSIS — Z20822 Contact with and (suspected) exposure to covid-19: Secondary | ICD-10-CM | POA: Insufficient documentation

## 2020-08-15 DIAGNOSIS — Z01812 Encounter for preprocedural laboratory examination: Secondary | ICD-10-CM | POA: Insufficient documentation

## 2020-08-15 LAB — SARS CORONAVIRUS 2 (TAT 6-24 HRS): SARS Coronavirus 2: NEGATIVE

## 2020-08-18 ENCOUNTER — Ambulatory Visit (HOSPITAL_COMMUNITY)
Admission: RE | Admit: 2020-08-18 | Discharge: 2020-08-18 | Disposition: A | Discharge status: Home / Self Care | Payer: 59 | Source: Ambulatory Visit | Attending: Adult Health | Admitting: Adult Health

## 2020-08-18 ENCOUNTER — Other Ambulatory Visit: Payer: Self-pay

## 2020-08-18 DIAGNOSIS — R062 Wheezing: Secondary | ICD-10-CM | POA: Diagnosis not present

## 2020-08-18 LAB — PULMONARY FUNCTION TEST
DL/VA % pred: 112 %
DL/VA: 4.9 ml/min/mmHg/L
DLCO unc % pred: 94 %
DLCO unc: 27.45 ml/min/mmHg
FEF 25-75 Post: 4.69 L/sec
FEF 25-75 Pre: 4.14 L/sec
FEF2575-%Change-Post: 13 %
FEF2575-%Pred-Post: 141 %
FEF2575-%Pred-Pre: 124 %
FEV1-%Change-Post: 2 %
FEV1-%Pred-Post: 90 %
FEV1-%Pred-Pre: 88 %
FEV1-Post: 3.52 L
FEV1-Pre: 3.44 L
FEV1FVC-%Change-Post: 2 %
FEV1FVC-%Pred-Pre: 110 %
FEV6-%Change-Post: 0 %
FEV6-%Pred-Post: 83 %
FEV6-%Pred-Pre: 83 %
FEV6-Post: 4.06 L
FEV6-Pre: 4.02 L
FEV6FVC-%Change-Post: 0 %
FEV6FVC-%Pred-Post: 104 %
FEV6FVC-%Pred-Pre: 103 %
FVC-%Change-Post: 0 %
FVC-%Pred-Post: 80 %
FVC-%Pred-Pre: 80 %
FVC-Post: 4.06 L
FVC-Pre: 4.04 L
Post FEV1/FVC ratio: 87 %
Post FEV6/FVC ratio: 100 %
Pre FEV1/FVC ratio: 85 %
Pre FEV6/FVC Ratio: 100 %
RV % pred: 129 %
RV: 2.78 L
TLC % pred: 98 %
TLC: 6.99 L

## 2020-08-18 MED ORDER — ALBUTEROL SULFATE (2.5 MG/3ML) 0.083% IN NEBU
2.5000 mg | INHALATION_SOLUTION | Freq: Once | RESPIRATORY_TRACT | Status: AC
  Administered 2020-08-18: 2.5 mg via RESPIRATORY_TRACT

## 2020-08-19 ENCOUNTER — Other Ambulatory Visit (HOSPITAL_BASED_OUTPATIENT_CLINIC_OR_DEPARTMENT_OTHER): Payer: Self-pay

## 2020-08-26 MED FILL — ESOMEPRAZOLE MAG DR 40 MG C: 40 | 90 days supply | Qty: 180 | Fill #0

## 2020-09-01 ENCOUNTER — Other Ambulatory Visit (HOSPITAL_COMMUNITY): Payer: Self-pay

## 2020-09-01 MED FILL — Losartan Potassium Tab 100 MG: ORAL | 90 days supply | Qty: 90 | Fill #0 | Status: AC

## 2020-09-01 MED FILL — Sertraline HCl Tab 100 MG: ORAL | 30 days supply | Qty: 30 | Fill #0 | Status: AC

## 2020-09-02 ENCOUNTER — Other Ambulatory Visit (HOSPITAL_COMMUNITY): Payer: Self-pay

## 2020-09-24 ENCOUNTER — Telehealth: Payer: 59 | Admitting: Family

## 2020-09-24 DIAGNOSIS — R059 Cough, unspecified: Secondary | ICD-10-CM | POA: Diagnosis not present

## 2020-09-24 MED ORDER — PREDNISONE 10 MG (21) PO TBPK: ORAL_TABLET | ORAL | 0 refills | Status: DC

## 2020-09-24 MED ORDER — BENZONATATE 100 MG PO CAPS: 100.0000 mg | ORAL_CAPSULE | Freq: Three times a day (TID) | ORAL | 0 refills | Status: DC | PRN

## 2020-09-24 NOTE — Progress Notes (Signed)

## 2020-09-26 ENCOUNTER — Telehealth: Payer: 59 | Admitting: Physician Assistant

## 2020-09-26 DIAGNOSIS — J208 Acute bronchitis due to other specified organisms: Secondary | ICD-10-CM

## 2020-09-26 DIAGNOSIS — R058 Other specified cough: Secondary | ICD-10-CM

## 2020-09-26 DIAGNOSIS — B9689 Other specified bacterial agents as the cause of diseases classified elsewhere: Secondary | ICD-10-CM

## 2020-09-26 MED ORDER — LEVOFLOXACIN 500 MG PO TABS: 500.0000 mg | ORAL_TABLET | Freq: Every day | ORAL | 0 refills | Status: AC

## 2020-09-26 NOTE — Progress Notes (Signed)
We are sorry that you are not feeling well.  Here is how we plan to help!  Based on your presentation I believe you most likely have A cough due to bacteria.  When patients have a fever and a productive cough with a change in color or increased sputum production, we are concerned about bacterial bronchitis.  If left untreated it can progress to pneumonia.  If your symptoms do not improve with your treatment plan it is important that you contact your provider.   I have prescribed Levofloxacin 500 mg daily for 7 days   From your responses in the eVisit questionnaire you describe inflammation in the upper respiratory tract which is causing a significant cough.  This is commonly called Bronchitis and has four common causes:    Allergies  Viral Infections  Acid Reflux  Bacterial Infection Allergies, viruses and acid reflux are treated by controlling symptoms or eliminating the cause. An example might be a cough caused by taking certain blood pressure medications. You stop the cough by changing the medication. Another example might be a cough caused by acid reflux. Controlling the reflux helps control the cough.  USE OF BRONCHODILATOR ("RESCUE") INHALERS: There is a risk from using your bronchodilator too frequently.  The risk is that over-reliance on a medication which only relaxes the muscles surrounding the breathing tubes can reduce the effectiveness of medications prescribed to reduce swelling and congestion of the tubes themselves.  Although you feel brief relief from the bronchodilator inhaler, your asthma may actually be worsening with the tubes becoming more swollen and filled with mucus.  This can delay other crucial treatments, such as oral steroid medications. If you need to use a bronchodilator inhaler daily, several times per day, you should discuss this with your provider.  There are probably better treatments that could be used to keep your asthma under control.     HOME CARE . Only take  medications as instructed by your medical team. . Complete the entire course of an antibiotic. . Drink plenty of fluids and get plenty of rest. . Avoid close contacts especially the very young and the elderly . Cover your mouth if you cough or cough into your sleeve. . Always remember to wash your hands . A steam or ultrasonic humidifier can help congestion.   GET HELP RIGHT AWAY IF: . You develop worsening fever. . You become short of breath . You cough up blood. . Your symptoms persist after you have completed your treatment plan MAKE SURE YOU   Understand these instructions.  Will watch your condition.  Will get help right away if you are not doing well or get worse.  Your e-visit answers were reviewed by a board certified advanced clinical practitioner to complete your personal care plan.  Depending on the condition, your plan could have included both over the counter or prescription medications. If there is a problem please reply  once you have received a response from your provider. Your safety is important to [place].  If you have drug allergies check your prescription carefully.    You can use MyChart to ask questions about today's visit, request a non-urgent call back, or ask for a work or school excuse for 24 hours related to this e-Visit. If it has been greater than 24 hours you will need to follow up with your provider, or enter a new e-Visit to address those concerns. You will get an e-mail in the next two days asking about your experience.  I hope that your e-visit has been valuable and will speed your recovery. Thank you for using e-visits.  I provided 7 minutes of non face-to-face time during this encounter for chart review and documentation.

## 2020-10-03 ENCOUNTER — Other Ambulatory Visit (HOSPITAL_COMMUNITY): Payer: Self-pay

## 2020-10-03 MED FILL — Sertraline HCl Tab 100 MG: ORAL | 30 days supply | Qty: 30 | Fill #1 | Status: AC

## 2020-10-28 ENCOUNTER — Other Ambulatory Visit (HOSPITAL_COMMUNITY): Payer: Self-pay

## 2020-10-28 ENCOUNTER — Other Ambulatory Visit: Payer: Self-pay | Admitting: Family Medicine

## 2020-10-28 DIAGNOSIS — F320 Major depressive disorder, single episode, mild: Secondary | ICD-10-CM

## 2020-10-28 DIAGNOSIS — F411 Generalized anxiety disorder: Secondary | ICD-10-CM

## 2020-10-28 MED FILL — Bupropion HCl Tab ER 12HR 200 MG: ORAL | 90 days supply | Qty: 180 | Fill #0 | Status: AC

## 2020-10-28 MED FILL — Amlodipine Besylate Tab 5 MG (Base Equivalent): ORAL | 90 days supply | Qty: 90 | Fill #0 | Status: AC

## 2020-10-28 NOTE — Telephone Encounter (Signed)
Needs appt then route back for refill

## 2020-10-29 ENCOUNTER — Other Ambulatory Visit (HOSPITAL_COMMUNITY): Payer: Self-pay

## 2020-10-29 NOTE — Telephone Encounter (Signed)
Sent mychart message

## 2020-10-31 DIAGNOSIS — Z20822 Contact with and (suspected) exposure to covid-19: Secondary | ICD-10-CM | POA: Diagnosis not present

## 2020-10-31 DIAGNOSIS — J069 Acute upper respiratory infection, unspecified: Secondary | ICD-10-CM | POA: Diagnosis not present

## 2020-11-03 ENCOUNTER — Other Ambulatory Visit (HOSPITAL_COMMUNITY): Payer: Self-pay

## 2020-11-03 ENCOUNTER — Other Ambulatory Visit: Payer: Self-pay | Admitting: Family Medicine

## 2020-11-03 ENCOUNTER — Telehealth (INDEPENDENT_AMBULATORY_CARE_PROVIDER_SITE_OTHER): Payer: 59 | Admitting: Family Medicine

## 2020-11-03 ENCOUNTER — Encounter: Payer: Self-pay | Admitting: Family Medicine

## 2020-11-03 ENCOUNTER — Other Ambulatory Visit: Payer: Self-pay

## 2020-11-03 DIAGNOSIS — I10 Essential (primary) hypertension: Secondary | ICD-10-CM | POA: Diagnosis not present

## 2020-11-03 DIAGNOSIS — E785 Hyperlipidemia, unspecified: Secondary | ICD-10-CM

## 2020-11-03 DIAGNOSIS — F411 Generalized anxiety disorder: Secondary | ICD-10-CM

## 2020-11-03 DIAGNOSIS — R7301 Impaired fasting glucose: Secondary | ICD-10-CM | POA: Diagnosis not present

## 2020-11-03 DIAGNOSIS — J0110 Acute frontal sinusitis, unspecified: Secondary | ICD-10-CM

## 2020-11-03 DIAGNOSIS — F320 Major depressive disorder, single episode, mild: Secondary | ICD-10-CM

## 2020-11-03 MED ORDER — DOXYCYCLINE HYCLATE 100 MG PO TABS: 100.0000 mg | ORAL_TABLET | Freq: Two times a day (BID) | ORAL | 0 refills | Status: DC

## 2020-11-03 NOTE — Progress Notes (Signed)
Patient ID: Christopher Bowers, male    DOB: 27-Aug-1965, 55 y.o.   MRN: [id number]   Virtual Visit via Telephone Note  I connected with Christopher Bowers on 11/03/20 at  1:50 PM EDT by telephone and verified that I am speaking with the correct person using two identifiers.  Location: Patient: home Provider: office   I discussed the limitations, risks, security and privacy concerns of performing an evaluation and management service by telephone and the availability of in person appointments. I also discussed with the patient that there may be a patient responsible charge related to this service. The patient expressed understanding and agreed to proceed.  Chief Complaint  Patient presents with  . Sinusitis   Subjective:    HPI   CC-wheezing and stopped up head. Started one week ago. Took home test and it was negative. Went to health clinic in [place] and had another test that was negative. Prescribed prednisone and has been taking for 4 days. Pt states he usually has to have "levaquin" to clear up infection. Taking albuterol inhaler for wheezing and its helping a little bit.   Started scratchy sore throat.  Negative covid 2nd test at urgent care. Is on day 4 of prednisone. Not worse but not better. No fever. Pt worried had pneumonia in past -2014, and needed levaquin to get rid of it in past.  meds- using flonase daily. Has cough syrup promethazine  F/u depression- Pt is on zoloft, and was on 68m and  inc to 1079m Had just lost father recently. Not sure it's helping.  Not having some emotions as he would normally.  Pt still on wellbutrin.  Pt tried to come off the wellbutrin for about 20 yrs per pt.  Pt stating, noticing bg ranging from 120-130s. Once saw a 160 in am.  Medical History Christopher Bowers has a past medical history of Carpal tunnel syndrome of right wrist, GERD (gastroesophageal reflux disease), History of acute respiratory failure, History of exercise stress test, History of  gastritis, History of hiatal hernia, Hypertension, and OSA (obstructive sleep apnea).   Outpatient Encounter Medications as of 11/03/2020  Medication Sig  . albuterol (VENTOLIN HFA) 108 (90 Base) MCG/ACT inhaler INHALE 2 PUFFS BY MOUTH INTO THE LUNGS EVERY 4 HOURS AS NEEDED FOR WHEEZING OR SHORTNESS OF BREATH  . amLODipine (NORVASC) 5 MG tablet Take 1 tablet (5 mg total) by mouth daily.  . Marland KitchenmLODipine (NORVASC) 5 MG tablet TAKE 1 TABLET (5 MG TOTAL) BY MOUTH DAILY.  . budesonide-formoterol (SYMBICORT) 80-4.5 MCG/ACT inhaler INHALE 2 PUFFS EVERY MORNING AND 2 PUFFS AT BEDTIME.  . Marland KitchenuPROPion (WELLBUTRIN SR) 200 MG 12 hr tablet Take 1 tablet (200 mg total) by mouth 2 (two) times daily.  . Marland KitchenuPROPion (WELLBUTRIN SR) 200 MG 12 hr tablet TAKE 1 TABLET BY MOUTH TWICE DAILY.  . Marland Kitchenholecalciferol (VITAMIN D3) 5000 UNITS TABS Take 5,000 Units by mouth daily. (when he remembers)  . doxycycline (VIBRA-TABS) 100 MG tablet Take 1 tablet (100 mg total) by mouth 2 (two) times daily.  . Marland Kitchensomeprazole (NEXIUM) 40 MG capsule TAKE 1 CAPSULE BY MOUTH TWICE DAILY BEFORE MEALS  . fluticasone (FLONASE) 50 MCG/ACT nasal spray Place 2 sprays into both nostrils daily.  . Marland Kitchenbuprofen (ADVIL) 200 MG tablet Take 200 mg by mouth every 6 (six) hours as needed.  . Marland Kitchenosartan (COZAAR) 100 MG tablet TAKE 1 TABLET BY MOUTH DAILY  . predniSONE (STERAPRED UNI-PAK 21 TAB) 10 MG (21) TBPK tablet Use as directed  .  sertraline (ZOLOFT) 100 MG tablet TAKE 1 TABLET (100 MG TOTAL) BY MOUTH DAILY.  . valsartan (DIOVAN) 160 MG tablet Take 1 tablet (160 mg total) by mouth every morning.  . [DISCONTINUED] benzonatate (TESSALON PERLES) 100 MG capsule Take 1 capsule (100 mg total) by mouth 3 (three) times daily as needed.   No facility-administered encounter medications on file as of 11/03/2020.     Review of Systems  Constitutional: Negative for chills and fever.  HENT: Positive for congestion. Negative for rhinorrhea and sore throat.   Respiratory:  Positive for cough and wheezing. Negative for shortness of breath.   Cardiovascular: Negative for chest pain and leg swelling.  Gastrointestinal: Negative for abdominal pain, diarrhea, nausea and vomiting.  Genitourinary: Negative for dysuria and frequency.  Skin: Negative for rash.  Neurological: Negative for dizziness, weakness and headaches.  Psychiatric/Behavioral: Negative for dysphoric mood. The patient is not nervous/anxious.      Vitals There were no vitals taken for this visit.  Objective:   Physical Exam No PE due to phone visit.   Assessment and Plan   1. Impaired fasting glucose - CMP14+EGFR - Hemoglobin A1c  2. Hyperlipidemia LDL goal <130 - CMP14+EGFR - CBC - Lipid panel  3. Essential hypertension - CMP14+EGFR - CBC - Lipid panel  4. Acute non-recurrent frontal sinusitis   Sinusitis- reviewed course and could be viral and that abx won't treat viral syndrome. Gave course of doxycycline instead of levaquin. Since felt the interaction with prednisone could increase risk of ruptured tendons.  Pt voiced understanding.  Advised to call if worsening sob, chest pain, fever, or not improving.  Will recheck a1c at next visit in 9/22.  Depression- Pt wanting to taper off the zoloft, not feeling it's allowing him to have much emotion and feeling emotional blunting. Gave taper over 2 wks, 1/2 tab for 7 days, then take 1/2 qod. Then discontinue.  F/u 65month.   Return in about 3 months (around 02/03/2021) for f/u htn, impaired glucose, hld.   Follow Up Instructions:    I discussed the assessment and treatment plan with the patient. The patient was provided an opportunity to ask questions and all were answered. The patient agreed with the plan and demonstrated an understanding of the instructions.   The patient was advised to call back or seek an in-person evaluation if the symptoms worsen or if the condition fails to improve as anticipated.  I provided 16  minutes of non-face-to-face time during this encounter.

## 2020-11-04 ENCOUNTER — Other Ambulatory Visit: Payer: Self-pay | Admitting: Adult Health

## 2020-11-04 ENCOUNTER — Other Ambulatory Visit (HOSPITAL_COMMUNITY): Payer: Self-pay

## 2020-11-04 MED ORDER — BUDESONIDE-FORMOTEROL FUMARATE 80-4.5 MCG/ACT IN AERO
2.0000 | INHALATION_SPRAY | Freq: Two times a day (BID) | RESPIRATORY_TRACT | 4 refills | Status: DC
  Filled 2020-11-04: qty 10.2, 30d supply, fill #0
  Filled 2021-03-27: qty 10.2, 30d supply, fill #1

## 2020-11-04 MED FILL — Albuterol Sulfate Inhal Aero 108 MCG/ACT (90MCG Base Equiv): RESPIRATORY_TRACT | 16 days supply | Qty: 18 | Fill #0 | Status: AC

## 2020-11-07 ENCOUNTER — Other Ambulatory Visit (HOSPITAL_COMMUNITY): Payer: Self-pay

## 2020-11-24 DIAGNOSIS — U071 COVID-19: Secondary | ICD-10-CM | POA: Diagnosis not present

## 2020-11-24 DIAGNOSIS — Z20822 Contact with and (suspected) exposure to covid-19: Secondary | ICD-10-CM | POA: Diagnosis not present

## 2020-11-27 DIAGNOSIS — R0902 Hypoxemia: Secondary | ICD-10-CM | POA: Diagnosis not present

## 2020-11-27 DIAGNOSIS — Z881 Allergy status to other antibiotic agents status: Secondary | ICD-10-CM | POA: Diagnosis not present

## 2020-11-27 DIAGNOSIS — G40909 Epilepsy, unspecified, not intractable, without status epilepticus: Secondary | ICD-10-CM | POA: Diagnosis not present

## 2020-11-27 DIAGNOSIS — U071 COVID-19: Secondary | ICD-10-CM | POA: Diagnosis not present

## 2020-11-27 DIAGNOSIS — R569 Unspecified convulsions: Secondary | ICD-10-CM | POA: Diagnosis not present

## 2020-11-27 DIAGNOSIS — Z888 Allergy status to other drugs, medicaments and biological substances status: Secondary | ICD-10-CM | POA: Diagnosis not present

## 2020-11-27 DIAGNOSIS — I10 Essential (primary) hypertension: Secondary | ICD-10-CM | POA: Diagnosis not present

## 2020-11-27 DIAGNOSIS — G40509 Epileptic seizures related to external causes, not intractable, without status epilepticus: Secondary | ICD-10-CM | POA: Diagnosis not present

## 2020-11-27 HISTORY — DX: Unspecified convulsions: R56.9

## 2020-11-28 DIAGNOSIS — I10 Essential (primary) hypertension: Secondary | ICD-10-CM | POA: Diagnosis not present

## 2020-11-28 DIAGNOSIS — Z881 Allergy status to other antibiotic agents status: Secondary | ICD-10-CM | POA: Diagnosis not present

## 2020-11-28 DIAGNOSIS — U071 COVID-19: Secondary | ICD-10-CM | POA: Diagnosis not present

## 2020-11-28 DIAGNOSIS — G40509 Epileptic seizures related to external causes, not intractable, without status epilepticus: Secondary | ICD-10-CM | POA: Diagnosis not present

## 2020-11-28 DIAGNOSIS — Z888 Allergy status to other drugs, medicaments and biological substances status: Secondary | ICD-10-CM | POA: Diagnosis not present

## 2020-12-17 ENCOUNTER — Other Ambulatory Visit (HOSPITAL_COMMUNITY): Payer: Self-pay

## 2020-12-17 ENCOUNTER — Other Ambulatory Visit: Payer: Self-pay | Admitting: Family Medicine

## 2020-12-17 MED ORDER — ALBUTEROL SULFATE HFA 108 (90 BASE) MCG/ACT IN AERS
INHALATION_SPRAY | RESPIRATORY_TRACT | 2 refills | Status: DC
  Filled 2020-12-17: qty 18, 16d supply, fill #0
  Filled 2021-03-04 – 2021-03-27 (×2): qty 18, 16d supply, fill #1

## 2020-12-17 MED FILL — Losartan Potassium Tab 100 MG: ORAL | 60 days supply | Qty: 60 | Fill #1 | Status: AC

## 2020-12-24 ENCOUNTER — Other Ambulatory Visit: Payer: Self-pay | Admitting: *Deleted

## 2020-12-24 ENCOUNTER — Encounter: Payer: Self-pay | Admitting: *Deleted

## 2020-12-30 ENCOUNTER — Encounter: Payer: Self-pay | Admitting: Diagnostic Neuroimaging

## 2020-12-30 ENCOUNTER — Ambulatory Visit: Payer: 59 | Admitting: Diagnostic Neuroimaging

## 2020-12-30 VITALS — BP 128/86 | HR 92 | Ht 71.0 in | Wt 257.0 lb

## 2020-12-30 DIAGNOSIS — R569 Unspecified convulsions: Secondary | ICD-10-CM | POA: Diagnosis not present

## 2020-12-30 NOTE — Patient Instructions (Addendum)
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   - Please maintain precautions. Do not participate in activities where a loss of awareness could harm you or someone else. No swimming alone, no tub bathing, no hot tubs, no driving, no operating motorized vehicles (cars, ATVs, motocycles, etc), lawnmowers, power tools or firearms. No standing at heights, such as rooftops, ladders or stairs. Avoid hot objects such as stoves, heaters, open fires. Wear a helmet when riding a bicycle, scooter, skateboard, etc. and avoid areas of traffic. Set your water heater to 120 degrees or less.

## 2020-12-30 NOTE — Progress Notes (Signed)
[facility]  PATIENT: Christopher Bowers DOB: 04/09/1966  REFERRING CLINICIAN: Karin Lieu, DO HISTORY FROM: patient and wife  REASON FOR VISIT: new consult    HISTORICAL  CHIEF COMPLAINT:  Chief Complaint  Patient presents with   New Patient (Initial Visit)    Rm 7 with wife prev est with Dr. Jannifer Franklin last visit in 2016, Referred back to discuss seizures.Pt reports 6 weeks ago he was d/x with Covid, was prescribed several meds namely paxlovid for treatment for Covid along with alcoholic drink and had a seizure. Reports this event took place on 11/27/20, reports since this event he as been feeling better. Was rx'd Keppra 500 mg but did not fill this rx.      HISTORY OF PRESENT ILLNESS:   UPDATE (12/30/20, VRP): 55 year old male here for evaluation of new onset seizure.  11/27/2020 patient was at home on the recliner watching a movie with his wife.  Without warning he made a noise and slumped over.  Patient does not recall any warning or prodromal symptoms.  His wife was a respiratory therapist came to his aid and noticed that he was slumped over and then began to have seizure activity.  Initially his arms and legs were extended and stiff and then he had shaking movements.  This lasted for about 30 seconds.  Then patient had a blank staring look on his face with his eyes roving back-and-forth.  Within a minute he was able to wake up and respond to his wife.  No tongue biting or incontinence.  No significant prolonged postictal confusion.  They called 911 for evaluation.  Patient was taken to local hospital in [facility] for [facility].  He was diagnosed with new onset seizure and prescribed Keppra.  He did not fill this prescription.  A few days prior to this event patient had been diagnosed with COVID infection and prescribed prednisone, Z-Pak and Paxlovid.  Patient had taken first dose of Paxlovid on the day of the seizure.  This was the second time patient had COVID  infection.  He has been vaccinated.  Patient did have a mild fever a few days prior to the seizure approximately of 100.2 degrees.  No other triggering or aggravating factors.  No previous seizures.  No family history of seizures.   PRIOR HPI (01/20/15. Dr. Jannifer Franklin): "Mr. Sadd is a 55 year old right-handed white male with a history of difficulty with cognitive processing. The patient indicates that he has done very well since last seen. The patient underwent formal neuropsychological evaluation that did not show a true dementing illness, more of issues with focusing. The patient does have a long-standing history of depression, he has been on Wellbutrin for number of years, he feels that his mood has been roughly stable. The patient has begun taking notes at work for several weeks, and he believes that this process has helped his memory, he has not had to take notes as much recently. Overall, he functions well at work and at home. He will occasionally be forgetful, but this is not a significant issue for him. Overall, he feels much better regarding his memory. He has not gotten back into exercise, he believes that he is sleeping well at night with good energy during the day. He denies any other significant medical issues that have come up since last seen."   REVIEW OF SYSTEMS: Full 14 system review of systems performed and negative with exception of: as per HPI.   ALLERGIES: Allergies  Allergen  Reactions   Augmentin [Amoxicillin-Pot Clavulanate] Rash    HOME MEDICATIONS: Outpatient Medications Prior to Visit  Medication Sig Dispense Refill   albuterol (VENTOLIN HFA) 108 (90 Base) MCG/ACT inhaler INHALE 2 PUFFS BY MOUTH INTO THE LUNGS EVERY 4 HOURS AS NEEDED FOR WHEEZING OR SHORTNESS OF BREATH 18 g 2   amLODipine (NORVASC) 5 MG tablet Take 1 tablet (5 mg total) by mouth daily. 90 tablet 1   amLODipine (NORVASC) 5 MG tablet TAKE 1 TABLET (5 MG TOTAL) BY MOUTH DAILY. 90 tablet 1    budesonide-formoterol (SYMBICORT) 80-4.5 MCG/ACT inhaler Inhale 2 puffs into the lungs in the morning and at bedtime. 10.2 g 4   buPROPion (WELLBUTRIN SR) 200 MG 12 hr tablet Take 200 mg by mouth 2 (two) times daily.     esomeprazole (NEXIUM) 40 MG capsule TAKE 1 CAPSULE BY MOUTH TWICE DAILY BEFORE MEALS 180 capsule 1   fluticasone (FLONASE) 50 MCG/ACT nasal spray Place 2 sprays into both nostrils daily. 16 g 1   ibuprofen (ADVIL) 200 MG tablet Take 200 mg by mouth every 6 (six) hours as needed.     losartan (COZAAR) 100 MG tablet TAKE 1 TABLET BY MOUTH DAILY 90 tablet 1   valsartan (DIOVAN) 160 MG tablet Take 1 tablet (160 mg total) by mouth every morning. 90 tablet 1   buPROPion (WELLBUTRIN SR) 200 MG 12 hr tablet Take 1 tablet (200 mg total) by mouth 2 (two) times daily. 180 tablet 1   buPROPion (WELLBUTRIN SR) 200 MG 12 hr tablet TAKE 1 TABLET BY MOUTH TWICE DAILY. 180 tablet 1   Cholecalciferol (VITAMIN D3) 5000 UNITS TABS Take 5,000 Units by mouth daily. (when he remembers)     doxycycline (VIBRA-TABS) 100 MG tablet Take 1 tablet (100 mg total) by mouth 2 (two) times daily. (Patient not taking: Reported on 12/30/2020) 20 tablet 0   levETIRAcetam (KEPPRA) 500 MG tablet SMARTSIG:1 Tablet(s) By Mouth Every 12 Hours (Patient not taking: Reported on 12/30/2020)     predniSONE (STERAPRED UNI-PAK 21 TAB) 10 MG (21) TBPK tablet Use as directed (Patient not taking: Reported on 12/30/2020) 21 tablet 0   sertraline (ZOLOFT) 100 MG tablet TAKE 1 TABLET (100 MG TOTAL) BY MOUTH DAILY. (Patient not taking: Reported on 12/30/2020) 30 tablet 2   No facility-administered medications prior to visit.    PAST MEDICAL HISTORY: Past Medical History:  Diagnosis Date   Carpal tunnel syndrome of right wrist    GERD (gastroesophageal reflux disease)    History of acute respiratory failure    Feb 2014  w/  CAP and ARDS  ---  resolved   History of exercise stress test    01-07-2010   -- negative for ischemia, no chest  pain,  hypertensive response to exercise   History of gastritis    History of hiatal hernia    Hypertension    OSA (obstructive sleep apnea)    per pt study done 2013 OSA intolerant cpap  ,  pt uses oral appliance   Seizure ([place]) 11/27/2020    PAST SURGICAL HISTORY: Past Surgical History:  Procedure Laterality Date   CARPAL TUNNEL RELEASE Right 05/01/2015   Procedure: RIGHT CARPAL TUNNEL RELEASE;  Surgeon: Iran Planas, MD;  Location: [facility];  Service: Orthopedics;  Laterality: Right;  ANESTHESIA: LOCAL/IV SEDATION   COLONOSCOPY WITH PROPOFOL N/A 01/11/2019   Procedure: COLONOSCOPY WITH PROPOFOL;  Surgeon: Daneil Dolin, MD;  [facility];  Service: Endoscopy;  Laterality: N/A;  9:15am   POLYPECTOMY  01/11/2019   Procedure: POLYPECTOMY;  Surgeon: Daneil Dolin, MD;  [facility];  Service: Endoscopy;;  colon   TONSILLECTOMY  age 44   TRANSTHORACIC ECHOCARDIOGRAM  07-11-2012   LVSF  45-55%    FAMILY HISTORY: Family History  Problem Relation Age of Onset   Diabetes Father    Heart disease Father    Diabetes Son    Colon cancer Neg Hx     SOCIAL HISTORY: Social History   Socioeconomic History   Marital status: Married    Spouse name: Not on file   Number of children: 1   Years of education: college   Highest education level: Not on file  Occupational History   Occupation: Scientist, clinical (histocompatibility and immunogenetics): [employer]    Comment: truck  Tobacco Use   Smoking status: Never   Smokeless tobacco: Current    Types: Snuff    Last attempt to quit: 02/26/2015   Tobacco comments:    "occasional snuff"  Vaping Use   Vaping Use: Never used  Substance and Sexual Activity   Alcohol use: Yes    Comment: 2-3 drinks a day   Drug use: No   Sexual activity: Not on file  Other Topics Concern   Not on file  Social History Narrative   Patient drinks about 3 cups of caffeine daily.   Patient is right handed.   Social Determinants of Health    Financial Resource Strain: Not on file  Food Insecurity: Not on file  Transportation Needs: Not on file  Physical Activity: Not on file  Stress: Not on file  Social Connections: Not on file  Intimate Partner Violence: Not on file     PHYSICAL EXAM  GENERAL EXAM/CONSTITUTIONAL: Vitals:  Vitals:   12/30/20 1024  BP: 128/86  Pulse: 92  SpO2: 95%  Weight: 257 lb (116.6 kg)  Height: '5\' 11"'$  (1.803 m)   Body mass index is 35.84 kg/m. Wt Readings from Last 3 Encounters:  12/30/20 257 lb (116.6 kg)  07/09/20 257 lb 12.8 oz (116.9 kg)  06/19/20 256 lb (116.1 kg)   Patient is in no distress; well developed, nourished and groomed; neck is supple  CARDIOVASCULAR: Examination of carotid arteries is normal; no carotid bruits Regular rate and rhythm, no murmurs Examination of peripheral vascular system by observation and palpation is normal  EYES: Ophthalmoscopic exam of optic discs and posterior segments is normal; no papilledema or hemorrhages No results found.  MUSCULOSKELETAL: Gait, strength, tone, movements noted in Neurologic exam below  NEUROLOGIC: MENTAL STATUS:  MMSE - Mini Mental State Exam 01/20/2015  Orientation to time 5  Orientation to Place 5  Registration 3  Attention/ Calculation 5  Recall 3  Language- name 2 objects 2  Language- repeat 1  Language- follow 3 step command 3  Language- read & follow direction 1  Write a sentence 1  Copy design 1  Total score 30   awake, alert, oriented to person, place and time recent and remote memory intact normal attention and concentration language fluent, comprehension intact, naming intact fund of knowledge appropriate  CRANIAL NERVE:  2nd - no papilledema on fundoscopic exam 2nd, 3rd, 4th, 6th - pupils equal and reactive to light, visual fields full to confrontation, extraocular muscles intact, no nystagmus 5th - facial sensation symmetric 7th - facial strength symmetric 8th - hearing intact 9th - palate  elevates symmetrically, uvula midline 11th - shoulder shrug symmetric 12th -  tongue protrusion midline  MOTOR:  normal bulk and tone, full strength in the BUE, BLE  SENSORY:  normal and symmetric to light touch, temperature, vibration  COORDINATION:  finger-nose-finger, fine finger movements normal  REFLEXES:  deep tendon reflexes present and symmetric  GAIT/STATION:  narrow based gait     DIAGNOSTIC DATA (LABS, IMAGING, TESTING) - I reviewed patient records, labs, notes, testing and imaging myself where available.  Lab Results  Component Value Date   WBC 10.2 07/04/2020   HGB 15.1 07/04/2020   HCT 44.1 07/04/2020   MCV 94 07/04/2020   PLT 218 07/04/2020      Component Value Date/Time   NA 142 07/04/2020 0821   K 4.9 07/04/2020 0821   CL 100 07/04/2020 0821   CO2 20 07/04/2020 0821   GLUCOSE 139 (H) 07/04/2020 0821   GLUCOSE 134 (H) 05/01/2015 1146   BUN 18 07/04/2020 0821   CREATININE 1.15 07/04/2020 0821   CREATININE 1.16 03/14/2013 1236   CALCIUM 9.7 07/04/2020 0821   PROT 7.0 07/04/2020 0821   ALBUMIN 4.6 07/04/2020 0821   AST 77 (H) 07/04/2020 0821   ALT 83 (H) 07/04/2020 0821   ALKPHOS 69 07/04/2020 0821   BILITOT 0.7 07/04/2020 0821   GFRNONAA 72 07/04/2020 0821   GFRAA 83 07/04/2020 0821   Lab Results  Component Value Date   CHOL 225 (H) 07/04/2020   HDL 50 07/04/2020   LDLCALC 155 (H) 07/04/2020   TRIG 109 07/04/2020   CHOLHDL 4.5 07/04/2020   Lab Results  Component Value Date   HGBA1C 5.7 (H) 08/16/2019   Lab Results  Component Value Date   [id number] 08/02/2013   Lab Results  Component Value Date   TSH 6.190 (H) 08/02/2013    08/14/13 MRI brain - Mild cerebral atrophy, otherwise unremarkable appearance of the brain.   ASSESSMENT AND PLAN  55 y.o. year old male here with:  Dx:  1. New onset seizure ([place])       PLAN:  NEW ONSET SEIZURE (11/27/20; possibly provoked by recent COVID infx, recent paxlovid use; chronic  wellbutrin x 20 years)  - check MRI, EEG; this will help determine whether anti-seizure meds need to be started  - According to [place] law, you can not drive unless you are seizure / syncope free for at least 6 months and under physician's care.   - Please maintain precautions. Do not participate in activities where a loss of awareness could harm you or someone else. No swimming alone, no tub bathing, no hot tubs, no driving, no operating motorized vehicles (cars, ATVs, motocycles, etc), lawnmowers, power tools or firearms. No standing at heights, such as rooftops, ladders or stairs. Avoid hot objects such as stoves, heaters, open fires. Wear a helmet when riding a bicycle, scooter, skateboard, etc. and avoid areas of traffic. Set your water heater to 120 degrees or less.   Orders Placed This Encounter  Procedures   MR BRAIN W WO CONTRAST   EEG adult   Return in about 6 months (around 07/02/2021).  I reviewed images, labs, notes, records myself. I summarized findings and reviewed with patient, for this high risk condition (seizure) requiring high complexity decision making.    Penni Bombard, MD [id number], [id number] Certified in Neurology, Neurophysiology and Neuroimaging  [facility] [address], [place], [place] [address] [phone]

## 2021-01-20 ENCOUNTER — Other Ambulatory Visit: Payer: 59

## 2021-01-20 ENCOUNTER — Telehealth: Payer: Self-pay | Admitting: Diagnostic Neuroimaging

## 2021-01-20 NOTE — Telephone Encounter (Signed)
Pt cancelled EEG appointment for 8/23 at [facility], sent order to [facility] they will reach out to pt to schedule.

## 2021-01-26 ENCOUNTER — Other Ambulatory Visit (HOSPITAL_COMMUNITY): Payer: Self-pay

## 2021-01-29 DIAGNOSIS — R7301 Impaired fasting glucose: Secondary | ICD-10-CM | POA: Diagnosis not present

## 2021-01-29 DIAGNOSIS — E785 Hyperlipidemia, unspecified: Secondary | ICD-10-CM | POA: Diagnosis not present

## 2021-01-29 DIAGNOSIS — Z20828 Contact with and (suspected) exposure to other viral communicable diseases: Secondary | ICD-10-CM | POA: Diagnosis not present

## 2021-01-29 DIAGNOSIS — I10 Essential (primary) hypertension: Secondary | ICD-10-CM | POA: Diagnosis not present

## 2021-01-30 LAB — CBC
Hematocrit: 43.4 % (ref 37.5–51.0)
Hemoglobin: 14.7 g/dL (ref 13.0–17.7)
MCH: 32.2 pg (ref 26.6–33.0)
MCHC: 33.9 g/dL (ref 31.5–35.7)
MCV: 95 fL (ref 79–97)
Platelets: 186 10*3/uL (ref 150–450)
RBC: 4.56 x10E6/uL (ref 4.14–5.80)
RDW: 12 % (ref 11.6–15.4)
WBC: 6.6 10*3/uL (ref 3.4–10.8)

## 2021-01-30 LAB — CMP14+EGFR
ALT: 98 IU/L — ABNORMAL HIGH (ref 0–44)
AST: 103 IU/L — ABNORMAL HIGH (ref 0–40)
Albumin/Globulin Ratio: 2.2 (ref 1.2–2.2)
Albumin: 4.7 g/dL (ref 3.8–4.9)
Alkaline Phosphatase: 73 IU/L (ref 44–121)
BUN/Creatinine Ratio: 15 (ref 9–20)
BUN: 15 mg/dL (ref 6–24)
Bilirubin Total: 0.5 mg/dL (ref 0.0–1.2)
CO2: 22 mmol/L (ref 20–29)
Calcium: 9.9 mg/dL (ref 8.7–10.2)
Chloride: 100 mmol/L (ref 96–106)
Creatinine, Ser: 1.03 mg/dL (ref 0.76–1.27)
Globulin, Total: 2.1 g/dL (ref 1.5–4.5)
Glucose: 137 mg/dL — ABNORMAL HIGH (ref 65–99)
Potassium: 4.7 mmol/L (ref 3.5–5.2)
Sodium: 139 mmol/L (ref 134–144)
Total Protein: 6.8 g/dL (ref 6.0–8.5)
eGFR: 86 mL/min/{1.73_m2} (ref >59)

## 2021-01-30 LAB — LIPID PANEL
Chol/HDL Ratio: 5.2 ratio — ABNORMAL HIGH (ref 0.0–5.0)
Cholesterol, Total: 238 mg/dL — ABNORMAL HIGH (ref 100–199)
HDL: 46 mg/dL (ref >39)
LDL Chol Calc (NIH): 176 mg/dL — ABNORMAL HIGH (ref 0–99)
Triglycerides: 89 mg/dL (ref 0–149)
VLDL Cholesterol Cal: 16 mg/dL (ref 5–40)

## 2021-01-30 LAB — HEMOGLOBIN A1C
Est. average glucose Bld gHb Est-mCnc: 131 mg/dL
Hgb A1c MFr Bld: 6.2 % — ABNORMAL HIGH (ref 4.8–5.6)

## 2021-02-03 ENCOUNTER — Encounter: Payer: Self-pay | Admitting: Family Medicine

## 2021-02-03 ENCOUNTER — Other Ambulatory Visit: Payer: Self-pay | Admitting: Family Medicine

## 2021-02-03 DIAGNOSIS — R748 Abnormal levels of other serum enzymes: Secondary | ICD-10-CM

## 2021-02-03 DIAGNOSIS — K760 Fatty (change of) liver, not elsewhere classified: Secondary | ICD-10-CM | POA: Insufficient documentation

## 2021-02-04 ENCOUNTER — Other Ambulatory Visit: Payer: Self-pay | Admitting: Family Medicine

## 2021-02-04 ENCOUNTER — Encounter: Payer: Self-pay | Admitting: Family Medicine

## 2021-02-04 ENCOUNTER — Other Ambulatory Visit: Payer: Self-pay

## 2021-02-04 ENCOUNTER — Other Ambulatory Visit (HOSPITAL_COMMUNITY): Payer: Self-pay

## 2021-02-04 ENCOUNTER — Ambulatory Visit: Payer: 59 | Admitting: Family Medicine

## 2021-02-04 ENCOUNTER — Ambulatory Visit
Admission: RE | Admit: 2021-02-04 | Discharge: 2021-02-04 | Disposition: A | Discharge status: Home / Self Care | Payer: 59 | Source: Ambulatory Visit | Attending: Diagnostic Neuroimaging | Admitting: Diagnostic Neuroimaging

## 2021-02-04 VITALS — BP 143/87 | HR 75 | Temp 97.1°F | Wt 259.0 lb

## 2021-02-04 DIAGNOSIS — E785 Hyperlipidemia, unspecified: Secondary | ICD-10-CM | POA: Diagnosis not present

## 2021-02-04 DIAGNOSIS — R569 Unspecified convulsions: Secondary | ICD-10-CM

## 2021-02-04 DIAGNOSIS — R748 Abnormal levels of other serum enzymes: Secondary | ICD-10-CM

## 2021-02-04 DIAGNOSIS — F411 Generalized anxiety disorder: Secondary | ICD-10-CM

## 2021-02-04 DIAGNOSIS — I10 Essential (primary) hypertension: Secondary | ICD-10-CM | POA: Diagnosis not present

## 2021-02-04 DIAGNOSIS — K760 Fatty (change of) liver, not elsewhere classified: Secondary | ICD-10-CM | POA: Diagnosis not present

## 2021-02-04 DIAGNOSIS — K219 Gastro-esophageal reflux disease without esophagitis: Secondary | ICD-10-CM | POA: Diagnosis not present

## 2021-02-04 MED ORDER — GADOBUTROL 1 MMOL/ML IV SOLN
20.0000 mL | Freq: Once | INTRAVENOUS | Status: AC | PRN
  Administered 2021-02-04: 20 mL via INTRAVENOUS

## 2021-02-04 MED ORDER — ESOMEPRAZOLE MAGNESIUM 40 MG PO CPDR
40.0000 mg | DELAYED_RELEASE_CAPSULE | Freq: Two times a day (BID) | ORAL | 1 refills | Status: DC
  Filled 2021-02-04: qty 180, 90d supply, fill #0
  Filled 2021-02-04: qty 180, fill #0

## 2021-02-04 MED ORDER — BUPROPION HCL ER (SR) 100 MG PO TB12: 100.0000 mg | ORAL_TABLET | Freq: Every day | ORAL | 0 refills | Status: DC

## 2021-02-04 MED ORDER — AMLODIPINE BESYLATE 5 MG PO TABS
5.0000 mg | ORAL_TABLET | Freq: Every day | ORAL | 1 refills | Status: DC
  Filled 2021-02-04 (×2): qty 90, 90d supply, fill #0

## 2021-02-04 MED ORDER — LOSARTAN POTASSIUM 100 MG PO TABS
100.0000 mg | ORAL_TABLET | Freq: Every day | ORAL | 1 refills | Status: DC
  Filled 2021-02-04 (×2): qty 90, 90d supply, fill #0

## 2021-02-04 NOTE — Progress Notes (Signed)
Patient ID: Christopher Bowers, male    DOB: Sep 01, 1965, 55 y.o.   MRN: [id number]   Chief Complaint  Patient presents with   Hypertension   Hyperlipidemia   Subjective:    HPI  Pt here for follow up on blood pressure and cholesterol. Pt states no issues. Trying to wean off Wellbutrin; pt is taking half a pill. Pt seen lab results on my chart. Pt would like [place] scheduled for next Monday.  Pt seen for f/u htn, hld, anxiety.  Had covid in end 6/22. Lungs feeling okay.  Occ in am coughing.   Taking albuterol dailly and taking symbicort- seeing Dr. Melvyn Novas.  Pulm. Has cough variant asthma and pneumonia and intubation in past.   HTN Pt compliant with BP meds.  No SEs Denies chest pain, sob, LE swelling, or blurry vision. Currently on losartan '100mg'$  was on valsartan in past due to med was out of stock.  Not on both.  Had possible seizure and saw neuro doing work up with mri and eeg. Pt in on wellbutrin and taking steroids and abx and 1st dose of paxlovid.  Weaned down to '100mg'$  daily of wellbutrin. Hasn't had another seiuzure since 6/22.  Sitting and watching TV and pt has passed out a few seconds.  Getting brain mri today and will have EEG. Had mri brian 2015- and was showing mild atrophy.  Alcohol intake- 5-6 liquor per day.  Not increased I nlast year and worse with diet in past year.  Wt Readings from Last 3 Encounters:  02/04/21 259 lb (117.5 kg)  12/30/20 257 lb (116.6 kg)  07/09/20 257 lb 12.8 oz (116.9 kg)   Father passed away was feeling down and more anxious and went on lexapro in 2/22.  Weaned off lexapro in previous months. Went off cold [place] and brain zaps.    Medical History Christopher Bowers has a past medical history of Carpal tunnel syndrome of right wrist, GERD (gastroesophageal reflux disease), History of acute respiratory failure, History of exercise stress test, History of gastritis, History of hiatal hernia, Hypertension, OSA (obstructive sleep apnea), and Seizure (Christopher Bowers)  (11/27/2020).   Outpatient Encounter Medications as of 02/04/2021  Medication Sig   albuterol (VENTOLIN HFA) 108 (90 Base) MCG/ACT inhaler INHALE 2 PUFFS BY MOUTH INTO THE LUNGS EVERY 4 HOURS AS NEEDED FOR WHEEZING OR SHORTNESS OF BREATH   budesonide-formoterol (SYMBICORT) 80-4.5 MCG/ACT inhaler Inhale 2 puffs into the lungs in the morning and at bedtime.   buPROPion ER (WELLBUTRIN SR) 100 MG 12 hr tablet Take 1 tablet (100 mg total) by mouth daily.   Cholecalciferol (VITAMIN D3) 5000 UNITS TABS Take 5,000 Units by mouth daily. (when he remembers)   fluticasone (FLONASE) 50 MCG/ACT nasal spray Place 2 sprays into both nostrils daily.   ibuprofen (ADVIL) 200 MG tablet Take 200 mg by mouth every 6 (six) hours as needed.   valsartan (DIOVAN) 160 MG tablet Take 1 tablet (160 mg total) by mouth every morning.   [DISCONTINUED] amLODipine (NORVASC) 5 MG tablet Take 1 tablet (5 mg total) by mouth daily.   [DISCONTINUED] buPROPion (WELLBUTRIN SR) 200 MG 12 hr tablet Take 200 mg by mouth 2 (two) times daily.   [DISCONTINUED] esomeprazole (NEXIUM) 40 MG capsule TAKE 1 CAPSULE BY MOUTH TWICE DAILY BEFORE MEALS   [DISCONTINUED] losartan (COZAAR) 100 MG tablet TAKE 1 TABLET BY MOUTH DAILY   amLODipine (NORVASC) 5 MG tablet Take 1 tablet (5 mg total) by mouth daily.   esomeprazole (NEXIUM) 40  MG capsule Take 1 capsule (40 mg total) by mouth 2 (two) times daily before meals   losartan (COZAAR) 100 MG tablet TAKE 1 TABLET BY MOUTH DAILY   [DISCONTINUED] amLODipine (NORVASC) 5 MG tablet TAKE 1 TABLET (5 MG TOTAL) BY MOUTH DAILY.   No facility-administered encounter medications on file as of 02/04/2021.     Review of Systems  Constitutional:  Negative for chills and fever.  HENT:  Negative for congestion, rhinorrhea and sore throat.   Respiratory:  Negative for cough, shortness of breath and wheezing.   Cardiovascular:  Negative for chest pain and leg swelling.  Gastrointestinal:  Negative for abdominal pain,  diarrhea, nausea and vomiting.  Genitourinary:  Negative for dysuria and frequency.  Skin:  Negative for rash.  Neurological:  Positive for seizures (possible seizure). Negative for dizziness, weakness and headaches.    Vitals BP (!) 143/87   Pulse 75   Temp (!) 97.1 F (36.2 C)   Wt 259 lb (117.5 kg)   SpO2 96%   BMI 36.12 kg/m   Objective:   Physical Exam Vitals and nursing note reviewed.  Constitutional:      General: He is not in acute distress.    Appearance: Normal appearance. He is not ill-appearing.  HENT:     Head: Normocephalic.     Nose: Nose normal. No congestion.     Mouth/Throat:     Mouth: Mucous membranes are moist.     Pharynx: No oropharyngeal exudate.  Eyes:     Extraocular Movements: Extraocular movements intact.     Conjunctiva/sclera: Conjunctivae normal.     Pupils: Pupils are equal, round, and reactive to light.  Cardiovascular:     Rate and Rhythm: Normal rate and regular rhythm.     Pulses: Normal pulses.     Heart sounds: Normal heart sounds. No murmur heard. Pulmonary:     Effort: Pulmonary effort is normal.     Breath sounds: Normal breath sounds. No wheezing, rhonchi or rales.  Musculoskeletal:        General: Normal range of motion.     Right lower leg: No edema.     Left lower leg: No edema.  Skin:    General: Skin is warm and dry.     Findings: No rash.  Neurological:     General: No focal deficit present.     Mental Status: He is alert and oriented to person, place, and time.     Cranial Nerves: No cranial nerve deficit.  Psychiatric:        Mood and Affect: Mood normal.        Behavior: Behavior normal.        Thought Content: Thought content normal.        Judgment: Judgment normal.     Assessment and Plan   1. Seizure-like activity ([place])  2. Essential hypertension - amLODipine (NORVASC) 5 MG tablet; Take 1 tablet (5 mg total) by mouth daily.  Dispense: 90 tablet; Refill: 1 - losartan (COZAAR) 100 MG tablet; TAKE 1  TABLET BY MOUTH DAILY  Dispense: 90 tablet; Refill: 1  3. Hyperlipidemia LDL goal <130  4. Gastroesophageal reflux disease, unspecified whether esophagitis present - esomeprazole (NEXIUM) 40 MG capsule; Take 1 capsule (40 mg total) by mouth 2 (two) times daily before meals  Dispense: 180 capsule; Refill: 1  5. Generalized anxiety disorder - buPROPion ER (WELLBUTRIN SR) 100 MG 12 hr tablet; Take 1 tablet (100 mg total) by mouth daily.  Dispense:  30 tablet; Refill: 0  6. Elevated liver enzymes  7. Fatty liver   Possible seizure- Pt has dec from wellbutrin 300 to '100mg'$ .  Still working with neuro.  Advising to dec alcohol intake and recheck liver enzymes and get u/s ruq.  Hld- increasing.  Pt to watch diet and increase in exercising and eat low chol diet. Cont to monitor.  Recheck next visit.  With elevated lfts, wanting to hold off on starting statin.  Htn- stable. Suboptimal.  Dec weight and dec salt in diet. Cont meds.  Anxiety-stable. Cont lower dose wellbutrin and f/u with neuro.  Prediab- stable. Inc in glucose.  Cont to watch carbs in diet and inc in exercising.  Elevated LFTs- at 103/98.  Cont to dec alcohol intake in a tapering way over the next few weeks and increase in exercising to help with dec weight for concern of fatty liver disease. Discussed trying to lose about 10% body weight. Pt to get u/s ruq next week.  Return in about 3 months (around 05/06/2021) for elevated liver enzymes, htn, prediab.

## 2021-02-09 ENCOUNTER — Other Ambulatory Visit: Payer: 59

## 2021-02-11 ENCOUNTER — Other Ambulatory Visit: Payer: Self-pay

## 2021-02-11 ENCOUNTER — Ambulatory Visit (HOSPITAL_COMMUNITY)
Admission: RE | Admit: 2021-02-11 | Discharge: 2021-02-11 | Disposition: A | Discharge status: Home / Self Care | Payer: 59 | Source: Ambulatory Visit | Attending: Diagnostic Neuroimaging | Admitting: Diagnostic Neuroimaging

## 2021-02-11 DIAGNOSIS — R569 Unspecified convulsions: Secondary | ICD-10-CM | POA: Diagnosis not present

## 2021-02-11 NOTE — Progress Notes (Signed)
EEG completed, results pending. 

## 2021-02-17 ENCOUNTER — Other Ambulatory Visit: Payer: Self-pay

## 2021-02-17 ENCOUNTER — Ambulatory Visit (HOSPITAL_COMMUNITY)
Admission: RE | Admit: 2021-02-17 | Discharge: 2021-02-17 | Disposition: A | Discharge status: Home / Self Care | Payer: 59 | Source: Ambulatory Visit | Attending: Family Medicine | Admitting: Family Medicine

## 2021-02-17 DIAGNOSIS — K760 Fatty (change of) liver, not elsewhere classified: Secondary | ICD-10-CM | POA: Diagnosis not present

## 2021-02-17 DIAGNOSIS — R748 Abnormal levels of other serum enzymes: Secondary | ICD-10-CM | POA: Insufficient documentation

## 2021-02-17 DIAGNOSIS — R945 Abnormal results of liver function studies: Secondary | ICD-10-CM | POA: Diagnosis not present

## 2021-03-04 ENCOUNTER — Other Ambulatory Visit (HOSPITAL_COMMUNITY): Payer: Self-pay

## 2021-03-05 NOTE — Procedures (Signed)
@  LOGO@   [facility]  EEG (ELECTROENCEPHALOGRAM) REPORT   STUDY DATE: 02/11/21 PATIENT NAME: Christopher Bowers DOB: 08/24/1965 MRN: [id number]  ORDERING CLINICIAN: Andrey Spearman, MD   TECHNOLOGIST: Zacarias Pontes EEG TECHNIQUE: Electroencephalogram was recorded utilizing standard 10-20 system of lead placement and reformatted into average and bipolar montages.  RECORDING TIME: 25 minutes  ACTIVATION: photic stimulation  CLINICAL INFORMATION: 55 year old male with seizures.  FINDINGS: Posterior dominant background rhythms, which attenuate with eye opening, ranging 9-10 hertz and 15-20 microvolts. No focal, lateralizing, epileptiform activity or seizures are seen. Patient recorded in the awake and drowsy state.    IMPRESSION:   Normal EEG in the awake and drowsy states.     INTERPRETING PHYSICIAN:  Penni Bombard, MD Certified in Neurology, Neurophysiology and Neuroimaging  [facility] [address] [place], [place] [address] [phone]

## 2021-03-05 NOTE — Addendum Note (Signed)
Encounter addended by: Penni Bombard, MD on: 03/05/2021 4:59 PM  Actions taken: Clinical Note Signed, Charge Capture section accepted

## 2021-03-11 ENCOUNTER — Other Ambulatory Visit (HOSPITAL_COMMUNITY): Payer: Self-pay

## 2021-03-27 ENCOUNTER — Other Ambulatory Visit (HOSPITAL_COMMUNITY): Payer: Self-pay

## 2021-03-29 ENCOUNTER — Telehealth: Payer: Self-pay | Admitting: Family Medicine

## 2021-03-29 DIAGNOSIS — R748 Abnormal levels of other serum enzymes: Secondary | ICD-10-CM

## 2021-03-29 NOTE — Telephone Encounter (Signed)
Nurses  Patient had elevated liver enzymes.  Had a ultrasound in September  This ultrasound showed fatty liver.  I am not sure if the patient is aware of the results of this test.  Patient does need to do a follow-up liver profile in November with a follow-up office visit with Dr. Lacinda Axon for further evaluation of elevated liver enzymes.

## 2021-03-30 NOTE — Telephone Encounter (Signed)
Left message to return call. Also sent my chart message to patient. Lab orders placed.

## 2021-03-31 NOTE — Telephone Encounter (Signed)
Pt did read my chart message sent to him

## 2021-04-07 IMAGING — DX DG CHEST 2V
2 series · 2 of 2 positions shown · non-contrast
Comparison: 12/17/2019

CLINICAL DATA: Wheezing and cough

EXAM:
CHEST - 2 VIEW

[chest pa]
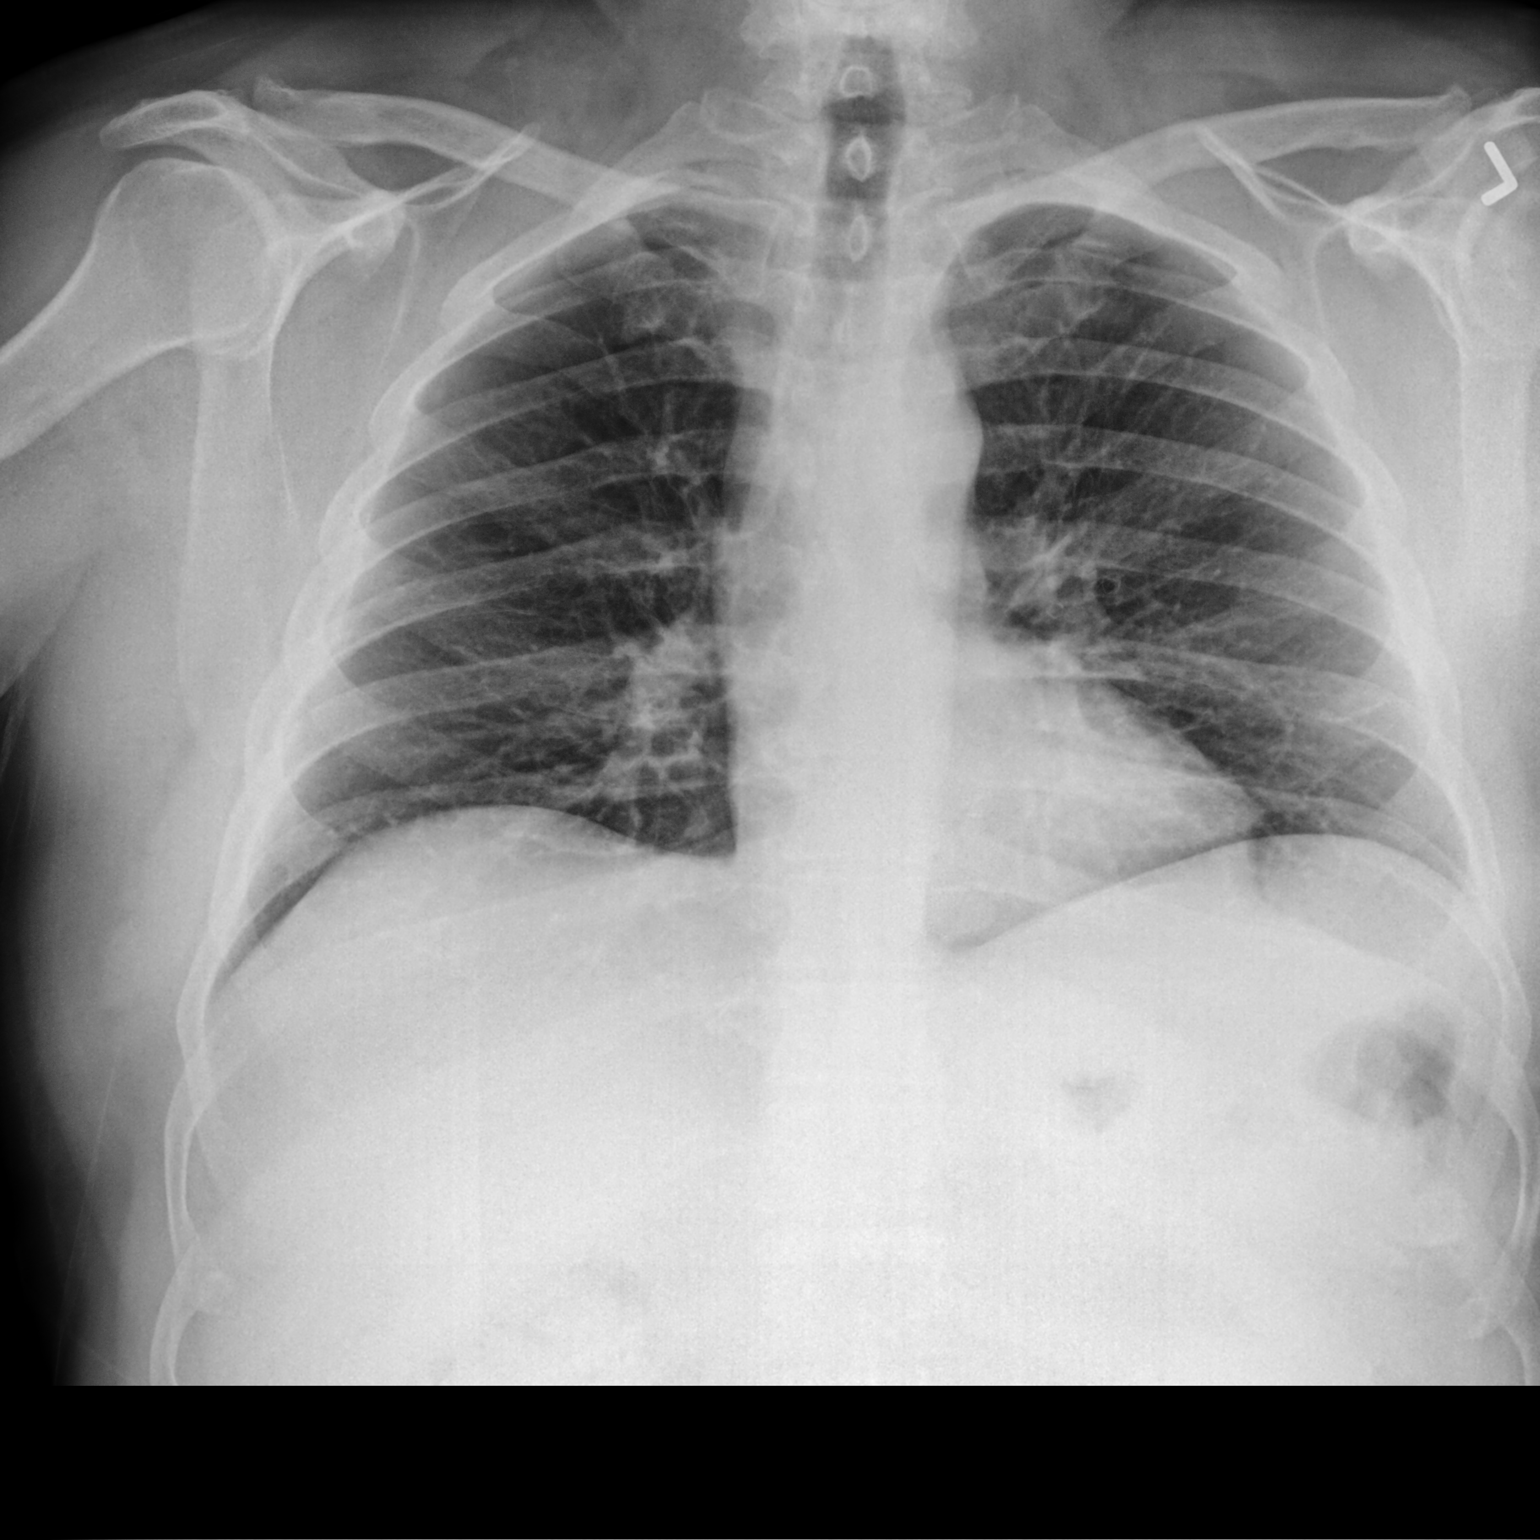

[chest lat]
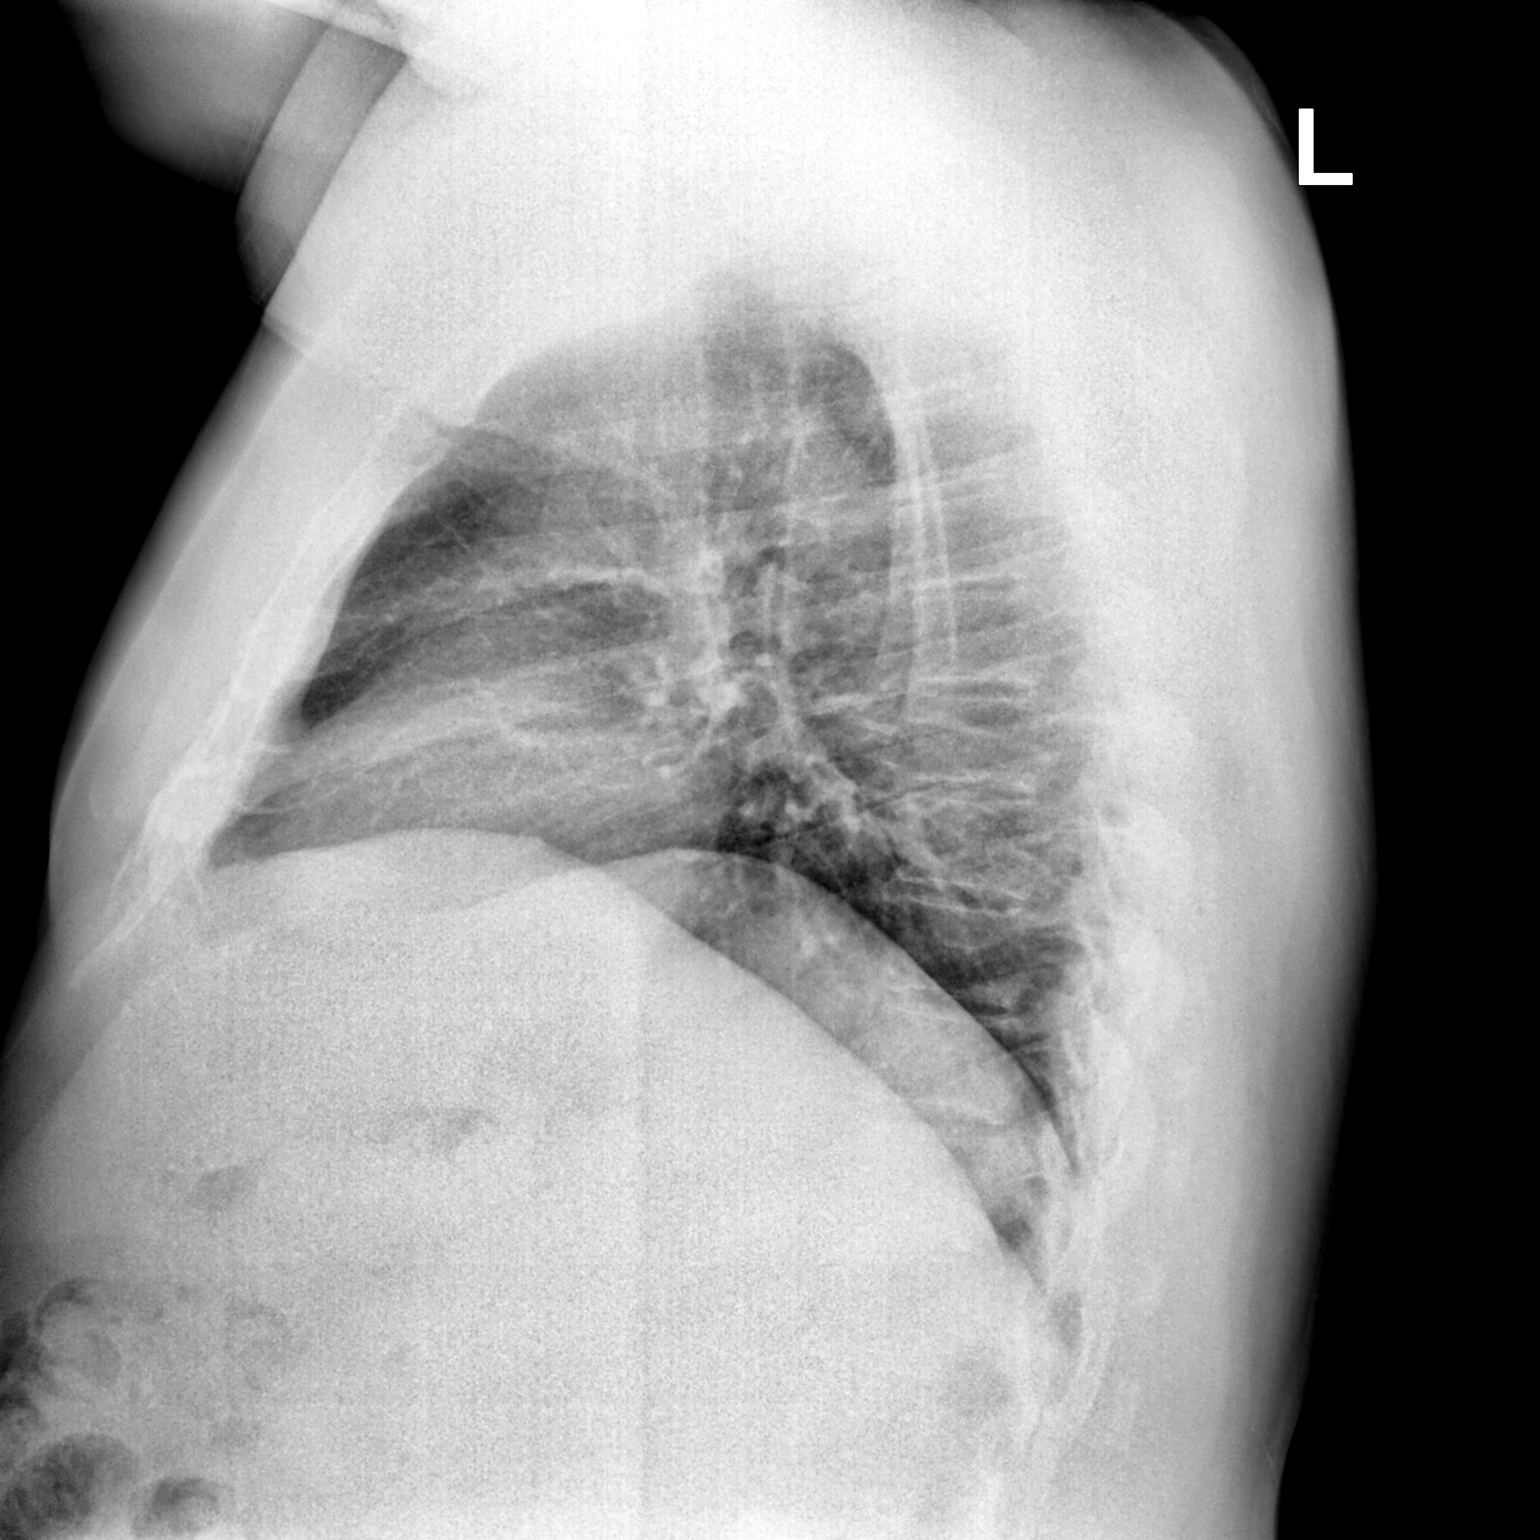

[2 of 2 positions shown; findings below may reference images not displayed]

FINDINGS: The heart size and mediastinal contours are within normal limits.
Both lungs are clear. The visualized skeletal structures are
unremarkable.
IMPRESSION: No active cardiopulmonary disease.

## 2021-05-07 DIAGNOSIS — R748 Abnormal levels of other serum enzymes: Secondary | ICD-10-CM | POA: Diagnosis not present

## 2021-05-08 LAB — HEPATIC FUNCTION PANEL
ALT: 69 IU/L — ABNORMAL HIGH (ref 0–44)
AST: 84 IU/L — ABNORMAL HIGH (ref 0–40)
Albumin: 4.8 g/dL (ref 3.8–4.9)
Alkaline Phosphatase: 67 IU/L (ref 44–121)
Bilirubin Total: 0.6 mg/dL (ref 0.0–1.2)
Bilirubin, Direct: 0.21 mg/dL (ref 0.00–0.40)
Total Protein: 6.8 g/dL (ref 6.0–8.5)

## 2021-05-11 ENCOUNTER — Other Ambulatory Visit (HOSPITAL_COMMUNITY): Payer: Self-pay

## 2021-05-11 ENCOUNTER — Other Ambulatory Visit: Payer: Self-pay

## 2021-05-11 ENCOUNTER — Ambulatory Visit: Payer: 59 | Admitting: Family Medicine

## 2021-05-11 ENCOUNTER — Encounter: Payer: Self-pay | Admitting: Family Medicine

## 2021-05-11 VITALS — BP 138/88 | HR 62 | Temp 98.2°F | Ht 71.0 in | Wt 268.4 lb

## 2021-05-11 DIAGNOSIS — J45991 Cough variant asthma: Secondary | ICD-10-CM

## 2021-05-11 DIAGNOSIS — E785 Hyperlipidemia, unspecified: Secondary | ICD-10-CM | POA: Insufficient documentation

## 2021-05-11 DIAGNOSIS — K760 Fatty (change of) liver, not elsewhere classified: Secondary | ICD-10-CM | POA: Diagnosis not present

## 2021-05-11 DIAGNOSIS — I10 Essential (primary) hypertension: Secondary | ICD-10-CM

## 2021-05-11 DIAGNOSIS — R7303 Prediabetes: Secondary | ICD-10-CM | POA: Insufficient documentation

## 2021-05-11 DIAGNOSIS — K219 Gastro-esophageal reflux disease without esophagitis: Secondary | ICD-10-CM

## 2021-05-11 DIAGNOSIS — E7800 Pure hypercholesterolemia, unspecified: Secondary | ICD-10-CM

## 2021-05-11 MED ORDER — BUDESONIDE-FORMOTEROL FUMARATE 80-4.5 MCG/ACT IN AERO
2.0000 | INHALATION_SPRAY | Freq: Two times a day (BID) | RESPIRATORY_TRACT | 4 refills | Status: DC
  Filled 2021-05-11 (×2): qty 10.2, 30d supply, fill #0
  Filled 2021-07-03: qty 10.2, 30d supply, fill #1
  Filled 2021-08-31: qty 10.2, 30d supply, fill #2
  Filled 2021-10-19: qty 10.2, 30d supply, fill #3
  Filled 2021-11-13: qty 10.2, 30d supply, fill #4

## 2021-05-11 MED ORDER — AMLODIPINE BESYLATE 5 MG PO TABS
5.0000 mg | ORAL_TABLET | Freq: Every day | ORAL | 1 refills | Status: DC
Start: 2021-05-11 — End: 2021-11-06
  Filled 2021-05-11 (×2): qty 90, 90d supply, fill #0
  Filled 2021-08-20: qty 90, 90d supply, fill #1

## 2021-05-11 MED ORDER — LOSARTAN POTASSIUM 100 MG PO TABS
100.0000 mg | ORAL_TABLET | Freq: Every day | ORAL | 1 refills | Status: DC
  Filled 2021-05-11 (×2): qty 90, 90d supply, fill #0
  Filled 2021-08-20: qty 90, 90d supply, fill #1

## 2021-05-11 MED ORDER — ALBUTEROL SULFATE HFA 108 (90 BASE) MCG/ACT IN AERS
2.0000 | INHALATION_SPRAY | RESPIRATORY_TRACT | 2 refills | Status: DC | PRN
  Filled 2021-05-11: qty 18, 16d supply, fill #0
  Filled 2021-05-11: qty 18, 17d supply, fill #0
  Filled 2021-07-03: qty 18, 16d supply, fill #1
  Filled 2021-09-15: qty 18, 16d supply, fill #2

## 2021-05-11 MED ORDER — ESOMEPRAZOLE MAGNESIUM 40 MG PO CPDR
40.0000 mg | DELAYED_RELEASE_CAPSULE | Freq: Two times a day (BID) | ORAL | 1 refills | Status: DC
  Filled 2021-05-11 (×2): qty 180, 90d supply, fill #0
  Filled 2021-11-13: qty 180, 90d supply, fill #1

## 2021-05-11 NOTE — Patient Instructions (Signed)
Recommend PCV 20. Ask pharmacist.  I have refilled your medications.  Follow up next year.  Take care  Dr. Lacinda Axon

## 2021-05-11 NOTE — Assessment & Plan Note (Signed)
Uncontrolled.  Needs lipid lowering therapy but declines at this time.

## 2021-05-11 NOTE — Progress Notes (Signed)
Subjective:  Patient ID: Christopher Bowers, male    DOB: [birth year]  Age: 55 y.o. MRN: [id number]  CC: Chief Complaint  Patient presents with   Hypertension    Pt states had ultrasound and showed fatty liver; states he has "reversed course" so that has been good.     HPI:  55 year old male with hypertension, prediabetes, hyperlipidemia, fatty liver/hepatic steatosis presents for follow-up.  Patient's hypertension is fairly well controlled.  He is compliant with losartan and amlodipine.  Needs refills.  Patient has known hepatic steatosis and chronically elevated liver enzymes.  He states that he is trying to eat healthy.  He is still drinking 4-5 alcoholic beverages daily.  We discussed the need to cut back.    GERD is stable on Nexium.  Has a history of cough variant asthma/upper airway cough syndrome.  He is compliant with Symbicort.  States that he is doing okay at this time.  Discussed pneumococcal vaccination.  He will consider getting this at his pharmacy.  Patient has known hyperlipidemia.  We discussed his most recent laboratory results with an LDL of 176.  I recommended treatment.  Patient states that he would like to repeat his blood work next year before pursuing treatment.  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 04/19/2013   Obesity 07/08/2012   Essential hypertension 07/08/2012    Social Hx   Social History   Socioeconomic History   Marital status: Married    Spouse name: Not on file   Number of children: 1   Years of education: college    Highest education level: Not on file  Occupational History   Occupation: Scientist, clinical (histocompatibility and immunogenetics): [employer]    Comment: truck  Tobacco Use   Smoking status: Never   Smokeless tobacco: Current    Types: Snuff    Last attempt to quit: 02/26/2015   Tobacco comments:    "occasional snuff"  Vaping Use   Vaping Use: Never used  Substance and Sexual Activity   Alcohol use: Yes    Alcohol/week: 5.0 standard drinks    Types: 5 Shots of liquor per week    Comment: 5-6 liquor drinks per day   Drug use: No   Sexual activity: Not on file  Other Topics Concern   Not on file  Social History Narrative   Patient drinks about 3 cups of caffeine daily.   Patient is right handed.   Social Determinants of Health   Financial Resource Strain: Not on file  Food Insecurity: Not on file  Transportation Needs: Not on file  Physical Activity: Not on file  Stress: Not on file  Social Connections: Not on file    Review of Systems  Constitutional: Negative.   Respiratory: Negative.    Cardiovascular: Negative.     Objective:  BP 138/88   Pulse 62   Temp 98.2 F (36.8 C)   Ht 5\' 11"  (1.803 m)   Wt 268 lb 6.4 oz (121.7 kg)   SpO2 97%   BMI 37.43 kg/m   BP/Weight 05/11/2021 5/0/0938 06/08/2991  Systolic  BP 765 465 035  Diastolic BP 88 87 86  Wt. (Lbs) 268.4 259 257  BMI 37.43 36.12 35.84    Physical Exam Vitals and nursing note reviewed.  Constitutional:      General: He is not in acute distress.    Appearance: Normal appearance. He is obese. He is not ill-appearing.  HENT:     Head: Normocephalic and atraumatic.  Cardiovascular:     Rate and Rhythm: Normal rate and regular rhythm.     Heart sounds: No murmur heard. Pulmonary:     Effort: Pulmonary effort is normal.     Breath sounds: Normal breath sounds. No wheezing, rhonchi or rales.  Neurological:     Mental Status: He is alert.  Psychiatric:        Mood and Affect: Mood normal.        Behavior: Behavior normal.     Lab Results  Component Value Date   WBC 6.6 01/29/2021   HGB 14.7 01/29/2021   HCT 43.4 01/29/2021   PLT 186 01/29/2021   GLUCOSE 137 (H) 01/29/2021   CHOL 238 (H) 01/29/2021   TRIG 89 01/29/2021   HDL 46 01/29/2021   LDLCALC 176 (H) 01/29/2021   ALT 69 (H) 05/07/2021   AST 84 (H) 05/07/2021   NA 139 01/29/2021   K 4.7 01/29/2021   CL 100 01/29/2021   CREATININE 1.03 01/29/2021   BUN 15 01/29/2021   CO2 22 01/29/2021   TSH 6.190 (H) 08/02/2013   HGBA1C 6.2 (H) 01/29/2021     Assessment & Plan:   Problem List Items Addressed This Visit       Cardiovascular and Mediastinum   Essential hypertension    Stable.  Continue losartan and amlodipine.  Refilled today.      Relevant Medications   amLODipine (NORVASC) 5 MG tablet   losartan (COZAAR) 100 MG tablet     Respiratory   Cough variant asthma    Stable.  Continue Symbicort.      Relevant Medications   budesonide-formoterol (SYMBICORT) 80-4.5 MCG/ACT inhaler   albuterol (VENTOLIN HFA) 108 (90 Base) MCG/ACT inhaler     Digestive   Fatty liver - Primary    We will continue to monitor this closely.  Advised to cut back on his alcohol consumption.  Patient states that he is going to be getting back in the gym.  He is trying to eat healthy.      GERD (gastroesophageal reflux disease)    Stable on Nexium.  Refilled today.      Relevant Medications   esomeprazole (NEXIUM) 40 MG capsule     Other   Hyperlipidemia    Uncontrolled.  Needs lipid lowering therapy but declines at this time.      Relevant Medications   amLODipine (NORVASC) 5 MG tablet   losartan (COZAAR) 100 MG tablet    Meds ordered this encounter  Medications   amLODipine (NORVASC) 5 MG tablet    Sig: Take 1 tablet (5 mg total) by mouth daily.    Dispense:  90 tablet    Refill:  1   budesonide-formoterol (SYMBICORT) 80-4.5 MCG/ACT inhaler    Sig: Inhale 2 puffs into the lungs in the morning and at bedtime.    Dispense:  10.2 g     Refill:  4   esomeprazole (NEXIUM) 40 MG capsule    Sig: Take 1 capsule (40 mg total) by mouth 2 (two) times daily before meals    Dispense:  180  capsule    Refill:  1   albuterol (VENTOLIN HFA) 108 (90 Base) MCG/ACT inhaler    Sig: Inhale 2 puffs into the lungs every 4 (four) hours as needed for wheezing or shortness of breath    Dispense:  18 g    Refill:  2   losartan (COZAAR) 100 MG tablet    Sig: Take 1 tablet (100 mg total) by mouth daily.    Dispense:  90 tablet    Refill:  1    Follow-up:  Return in about 3 months (around 08/09/2021).  [place]

## 2021-05-11 NOTE — Assessment & Plan Note (Signed)
Stable on Nexium.  Refilled today.

## 2021-05-11 NOTE — Assessment & Plan Note (Signed)
Stable.  Continue losartan and amlodipine.  Refilled today.

## 2021-05-11 NOTE — Assessment & Plan Note (Signed)
Stable.  Continue Symbicort. 

## 2021-05-11 NOTE — Assessment & Plan Note (Signed)
We will continue to monitor this closely.  Advised to cut back on his alcohol consumption.  Patient states that he is going to be getting back in the gym.  He is trying to eat healthy.

## 2021-07-03 ENCOUNTER — Other Ambulatory Visit (HOSPITAL_COMMUNITY): Payer: Self-pay

## 2021-07-06 ENCOUNTER — Encounter: Payer: Self-pay | Admitting: Diagnostic Neuroimaging

## 2021-07-06 ENCOUNTER — Ambulatory Visit: Payer: 59 | Admitting: Diagnostic Neuroimaging

## 2021-07-06 ENCOUNTER — Other Ambulatory Visit: Payer: Self-pay

## 2021-07-06 VITALS — BP 138/84 | HR 84 | Ht 71.0 in | Wt 269.0 lb

## 2021-07-06 DIAGNOSIS — R569 Unspecified convulsions: Secondary | ICD-10-CM | POA: Diagnosis not present

## 2021-07-06 NOTE — Progress Notes (Signed)
[facility]  PATIENT: Christopher Bowers DOB: [birth year]  REFERRING CLINICIAN: Elvia Collum M, DO HISTORY FROM: patient REASON FOR VISIT: follow up   HISTORICAL  CHIEF COMPLAINT:  Chief Complaint  Patient presents with   Seizures    Rm 7, 6 month FU "no seizure activity"    HISTORY OF PRESENT ILLNESS:   UPDATE (07/06/21, VRP): Since last visit, doing well. Symptoms are stable. No seizures. Now off wellbutrin.  UPDATE (12/30/20, VRP): 57 year old male here for evaluation of new onset seizure.  11/27/2020 patient was at home on the recliner watching a movie with his wife.  Without warning he made a noise and slumped over.  Patient does not recall any warning or prodromal symptoms.  His wife was a respiratory therapist came to his aid and noticed that he was slumped over and then began to have seizure activity.  Initially his arms and legs were extended and stiff and then he had shaking movements.  This lasted for about 30 seconds.  Then patient had a blank staring look on his face with his eyes roving back-and-forth.  Within a minute he was able to wake up and respond to his wife.  No tongue biting or incontinence.  No significant prolonged postictal confusion.  They called 911 for evaluation.  Patient was taken to local hospital in [facility] for evaluation.  He was diagnosed with new onset seizure and prescribed Keppra.  He did not fill this prescription.  A few days prior to this event patient had been diagnosed with COVID infection and prescribed prednisone, Z-Pak and Paxlovid.  Patient had taken first dose of Paxlovid on the day of the seizure.  This was the second time patient had COVID infection.  He has been vaccinated.  Patient did have a mild fever a few days prior to the seizure approximately of 100.2 degrees.  No other triggering or aggravating factors.  No previous seizures.  No family history of seizures.   PRIOR HPI (01/20/15. Dr. Jannifer Franklin): "[name] is a  56 year old right-handed white male with a history of difficulty with cognitive processing. The patient indicates that he has done very well since last seen. The patient underwent formal neuropsychological evaluation that did not show a true dementing illness, more of issues with focusing. The patient does have a long-standing history of depression, he has been on Wellbutrin for number of years, he feels that his mood has been roughly stable. The patient has begun taking notes at work for several weeks, and he believes that this process has helped his memory, he has not had to take notes as much recently. Overall, he functions well at work and at home. He will occasionally be forgetful, but this is not a significant issue for him. Overall, he feels much better regarding his memory. He has not gotten back into exercise, he believes that he is sleeping well at night with good energy during the day. He denies any other significant medical issues that have come up since last seen."   REVIEW OF SYSTEMS: Full 14 system review of systems performed and negative with exception of: as per HPI.   ALLERGIES: Allergies  Allergen Reactions   Augmentin [Amoxicillin-Pot Clavulanate] Rash    HOME MEDICATIONS: Outpatient Medications Prior to Visit  Medication Sig Dispense Refill   albuterol (VENTOLIN HFA) 108 (90 Base) MCG/ACT inhaler Inhale 2 puffs into the lungs every 4 (four) hours as needed for wheezing or shortness of breath 18 g 2   amLODipine (  NORVASC) 5 MG tablet Take 1 tablet (5 mg total) by mouth daily. 90 tablet 1   budesonide-formoterol (SYMBICORT) 80-4.5 MCG/ACT inhaler Inhale 2 puffs into the lungs in the morning and at bedtime. 10.2 g 4   Cholecalciferol (VITAMIN D3) 5000 UNITS TABS Take 5,000 Units by mouth daily. (when he remembers)     esomeprazole (NEXIUM) 40 MG capsule Take 1 capsule (40 mg total) by mouth 2 (two) times daily before meals 180 capsule 1   fluticasone (FLONASE) 50 MCG/ACT nasal  spray Place 2 sprays into both nostrils daily. 16 g 1   ibuprofen (ADVIL) 200 MG tablet Take 200 mg by mouth every 6 (six) hours as needed.     losartan (COZAAR) 100 MG tablet Take 1 tablet (100 mg total) by mouth daily. 90 tablet 1   No facility-administered medications prior to visit.    PAST MEDICAL HISTORY: Past Medical History:  Diagnosis Date   Carpal tunnel syndrome of right wrist    GERD (gastroesophageal reflux disease)    History of acute respiratory failure    Feb 2014  w/  CAP and ARDS  ---  resolved   History of exercise stress test    01-07-2010   -- negative for ischemia, no chest pain,  hypertensive response to exercise   History of gastritis    History of hiatal hernia    Hypertension    OSA (obstructive sleep apnea)    per pt study done 2013 OSA intolerant cpap  ,  pt uses oral appliance   Seizure ([place]) 11/27/2020    PAST SURGICAL HISTORY: Past Surgical History:  Procedure Laterality Date   CARPAL TUNNEL RELEASE Right 05/01/2015   Procedure: RIGHT CARPAL TUNNEL RELEASE;  Surgeon: Iran Planas, MD;  Location: [facility];  Service: Orthopedics;  Laterality: Right;  ANESTHESIA: LOCAL/IV SEDATION   COLONOSCOPY WITH PROPOFOL N/A 01/11/2019   Procedure: COLONOSCOPY WITH PROPOFOL;  Surgeon: Daneil Dolin, MD;  [facility];  Service: Endoscopy;  Laterality: N/A;  9:15am   POLYPECTOMY  01/11/2019   Procedure: POLYPECTOMY;  Surgeon: Daneil Dolin, MD;  [facility];  Service: Endoscopy;;  colon   TONSILLECTOMY  age 61   TRANSTHORACIC ECHOCARDIOGRAM  07-11-2012   LVSF  45-55%    FAMILY HISTORY: Family History  Problem Relation Age of Onset   Diabetes Father    Heart disease Father    Diabetes Son    Colon cancer Neg Hx     SOCIAL HISTORY: Social History   Socioeconomic History   Marital status: Married    Spouse name: Not on file   Number of children: 1   Years of education: college   Highest education level: Not on  file  Occupational History   Occupation: Scientist, clinical (histocompatibility and immunogenetics): [employer]    Comment: truck  Tobacco Use   Smoking status: Never   Smokeless tobacco: Current    Types: Snuff    Last attempt to quit: 02/26/2015   Tobacco comments:    "occasional snuff"  Vaping Use   Vaping Use: Never used  Substance and Sexual Activity   Alcohol use: Yes    Alcohol/week: 5.0 standard drinks    Types: 5 Shots of liquor per week    Comment: 5-6 liquor drinks per day   Drug use: No   Sexual activity: Not on file  Other Topics Concern   Not on file  Social History Narrative   Patient  drinks about 3 cups of caffeine daily.   Patient is right handed.   Social Determinants of Health   Financial Resource Strain: Not on file  Food Insecurity: Not on file  Transportation Needs: Not on file  Physical Activity: Not on file  Stress: Not on file  Social Connections: Not on file  Intimate Partner Violence: Not on file     PHYSICAL EXAM  GENERAL EXAM/CONSTITUTIONAL: Vitals:  Vitals:   07/06/21 1621  BP: 138/84  Pulse: 84  Weight: 269 lb (122 kg)  Height: 5\' 11"  (1.803 m)   Body mass index is 37.52 kg/m. Wt Readings from Last 3 Encounters:  07/06/21 269 lb (122 kg)  05/11/21 268 lb 6.4 oz (121.7 kg)  02/04/21 259 lb (117.5 kg)   Patient is in no distress; well developed, nourished and groomed; neck is supple  CARDIOVASCULAR: Examination of carotid arteries is normal; no carotid bruits Regular rate and rhythm, no murmurs Examination of peripheral vascular system by observation and palpation is normal  EYES: Ophthalmoscopic exam of optic discs and posterior segments is normal; no papilledema or hemorrhages No results found.  MUSCULOSKELETAL: Gait, strength, tone, movements noted in Neurologic exam below  NEUROLOGIC: MENTAL STATUS:  MMSE - Mini Mental State Exam 01/20/2015  Orientation to time 5  Orientation to Place 5  Registration 3  Attention/ Calculation 5  Recall 3   Language- name 2 objects 2  Language- repeat 1  Language- follow 3 step command 3  Language- read & follow direction 1  Write a sentence 1  Copy design 1  Total score 30   awake, alert, oriented to person, place and time recent and remote memory intact normal attention and concentration language fluent, comprehension intact, naming intact fund of knowledge appropriate  CRANIAL NERVE:  2nd - no papilledema on fundoscopic exam 2nd, 3rd, 4th, 6th - pupils equal and reactive to light, visual fields full to confrontation, extraocular muscles intact, no nystagmus 5th - facial sensation symmetric 7th - facial strength symmetric 8th - hearing intact 9th - palate elevates symmetrically, uvula midline 11th - shoulder shrug symmetric 12th - tongue protrusion midline  MOTOR:  normal bulk and tone, full strength in the BUE, BLE  SENSORY:  normal and symmetric to light touch, temperature, vibration  COORDINATION:  finger-nose-finger, fine finger movements normal  REFLEXES:  deep tendon reflexes present and symmetric  GAIT/STATION:  narrow based gait     DIAGNOSTIC DATA (LABS, IMAGING, TESTING) - I reviewed patient records, labs, notes, testing and imaging myself where available.  Lab Results  Component Value Date   WBC 6.6 01/29/2021   HGB 14.7 01/29/2021   HCT 43.4 01/29/2021   MCV 95 01/29/2021   PLT 186 01/29/2021      Component Value Date/Time   NA 139 01/29/2021 0817   K 4.7 01/29/2021 0817   CL 100 01/29/2021 0817   CO2 22 01/29/2021 0817   GLUCOSE 137 (H) 01/29/2021 0817   GLUCOSE 134 (H) 05/01/2015 1146   BUN 15 01/29/2021 0817   CREATININE 1.03 01/29/2021 0817   CREATININE 1.16 03/14/2013 1236   CALCIUM 9.9 01/29/2021 0817   PROT 6.8 05/07/2021 0818   ALBUMIN 4.8 05/07/2021 0818   AST 84 (H) 05/07/2021 0818   ALT 69 (H) 05/07/2021 0818   ALKPHOS 67 05/07/2021 0818   BILITOT 0.6 05/07/2021 0818   GFRNONAA 72 07/04/2020 0821   GFRAA 83 07/04/2020  0821   Lab Results  Component Value Date   CHOL 238 (  H) 01/29/2021   HDL 46 01/29/2021   LDLCALC 176 (H) 01/29/2021   TRIG 89 01/29/2021   CHOLHDL 5.2 (H) 01/29/2021   Lab Results  Component Value Date   HGBA1C 6.2 (H) 01/29/2021   Lab Results  Component Value Date   HXTAVWPV94 801 08/02/2013   Lab Results  Component Value Date   TSH 6.190 (H) 08/02/2013    08/14/13 MRI brain - Mild cerebral atrophy, otherwise unremarkable appearance of the brain.  02/04/21 MRI brain - Unremarkable MRI brain (with and without). No acute findings.  02/11/21 EEG - Normal EEG in the awake and drowsy states.    ASSESSMENT AND PLAN  56 y.o. year old male here with:  Dx:  1. New onset seizure ([place])     PLAN:  NEW ONSET SEIZURE (11/27/20 seizure x 1; possibly provoked by recent COVID infx, paxlovid use; chronic wellbutrin x 20 years) - doing well; monitor for now - seizure precautions reviewed  No follow-ups on file.    Penni Bombard, MD 11/02/5372, 8:27 PM Certified in Neurology, Neurophysiology and Neuroimaging  [facility] [address] [place], [place] [address] [phone]

## 2021-07-10 DIAGNOSIS — G5602 Carpal tunnel syndrome, left upper limb: Secondary | ICD-10-CM | POA: Diagnosis not present

## 2021-07-13 ENCOUNTER — Other Ambulatory Visit (HOSPITAL_COMMUNITY): Payer: Self-pay

## 2021-07-13 MED ORDER — DIAZEPAM 5 MG PO TABS
ORAL_TABLET | ORAL | 0 refills | Status: DC
  Filled 2021-07-13: qty 2, 1d supply, fill #0

## 2021-08-10 ENCOUNTER — Ambulatory Visit: Payer: 59 | Admitting: Family Medicine

## 2021-08-13 ENCOUNTER — Other Ambulatory Visit: Payer: Self-pay

## 2021-08-13 ENCOUNTER — Ambulatory Visit: Payer: 59 | Admitting: Family Medicine

## 2021-08-13 ENCOUNTER — Other Ambulatory Visit (HOSPITAL_COMMUNITY): Payer: Self-pay

## 2021-08-13 VITALS — BP 126/83 | HR 75 | Temp 98.1°F | Ht 71.0 in | Wt 266.0 lb

## 2021-08-13 DIAGNOSIS — E7800 Pure hypercholesterolemia, unspecified: Secondary | ICD-10-CM | POA: Diagnosis not present

## 2021-08-13 DIAGNOSIS — I10 Essential (primary) hypertension: Secondary | ICD-10-CM | POA: Diagnosis not present

## 2021-08-13 DIAGNOSIS — R7303 Prediabetes: Secondary | ICD-10-CM

## 2021-08-13 DIAGNOSIS — Z125 Encounter for screening for malignant neoplasm of prostate: Secondary | ICD-10-CM | POA: Diagnosis not present

## 2021-08-13 DIAGNOSIS — Z130 Encounter for screening for diseases of the blood and blood-forming organs and certain disorders involving the immune mechanism: Secondary | ICD-10-CM

## 2021-08-13 DIAGNOSIS — K760 Fatty (change of) liver, not elsewhere classified: Secondary | ICD-10-CM | POA: Diagnosis not present

## 2021-08-13 MED ORDER — ROSUVASTATIN CALCIUM 10 MG PO TABS
10.0000 mg | ORAL_TABLET | Freq: Every day | ORAL | 3 refills | Status: DC
  Filled 2021-08-13: qty 90, 90d supply, fill #0
  Filled 2021-11-06: qty 90, 90d supply, fill #1
  Filled 2022-02-04: qty 90, 90d supply, fill #2
  Filled 2022-05-11: qty 90, 90d supply, fill #3

## 2021-08-13 NOTE — Patient Instructions (Signed)
Medication as prescribed. ? ?Labs in 6 weeks. ? ?Follow up in June. ? ?Take care ? ?Dr. Lacinda Axon  ?

## 2021-08-13 NOTE — Progress Notes (Signed)
? ?Subjective:  ?Patient ID: Christopher Bowers, male    DOB: [birth year]  Age: 56 y.o. MRN: [id number] ? ?CC:  Follow up ? ?HPI: ? ?56 year old male presents for follow-up. ? ?Patient has obesity and fatty liver disease.  Chronically elevated LFTs.  He has made recent lifestyle changes.  He is exercising.  He has cut back on his alcohol intake.  He states that he is doing well. ? ?Hypertension is well controlled on amlodipine and losartan. ? ?Discussed patient's lipid panel results and recommendations for pharmacotherapy in addition to diet and lifestyle changes.  This may aid his fatty liver disease as well. ? ?Patient Active Problem List  ? Diagnosis Date Noted  ? Prediabetes 05/11/2021  ? Hyperlipidemia 05/11/2021  ? GERD (gastroesophageal reflux disease) 05/11/2021  ? Fatty liver 02/03/2021  ? Major depressive disorder with single episode, in remission ([place]) 01/08/2020  ? Cough variant asthma 03/22/2017  ? Generalized anxiety disorder 02/05/2017  ? Obstructive sleep apnea 07/21/2016  ? Obesity 07/08/2012  ? Essential hypertension 07/08/2012  ? ? ?Social Hx   ?Social History  ? ?Socioeconomic History  ? Marital status: Married  ?  Spouse name: Not on file  ? Number of children: 1  ? Years of education: college  ? Highest education level: Not on file  ?Occupational History  ? Occupation: Press photographer  ?  Employer: [employer]  ?  Comment: truck  ?Tobacco Use  ? Smoking status: Never  ? Smokeless tobacco: Current  ?  Types: Snuff  ?  Last attempt to quit: 02/26/2015  ? Tobacco comments:  ?  "occasional snuff"  ?Vaping Use  ? Vaping Use: Never used  ?Substance and Sexual Activity  ? Alcohol use: Yes  ?  Alcohol/week: 5.0 standard drinks  ?  Types: 5 Shots of liquor per week  ?  Comment: 5-6 liquor drinks per day  ? Drug use: No  ? Sexual activity: Not on file  ?Other Topics Concern  ? Not on file  ?Social History Narrative  ? Patient drinks about 3 cups of caffeine daily.  ? Patient is right handed.  ? ?Social Determinants of  Health  ? ?Financial Resource Strain: Not on file  ?Food Insecurity: Not on file  ?Transportation Needs: Not on file  ?Physical Activity: Not on file  ?Stress: Not on file  ?Social Connections: Not on file  ? ? ?Review of Systems  ?Constitutional: Negative.   ?Cardiovascular: Negative.   ? ?Objective:  ?BP 126/83   Pulse 75   Temp 98.1 ?F (36.7 ?C)   Ht 5' 11" (1.803 m)   Wt 266 lb (120.7 kg)   SpO2 97%   BMI 37.10 kg/m?  ? ?BP/Weight 08/13/2021 07/06/2021 05/11/2021  ?Systolic BP 845 364 680  ?Diastolic BP 83 84 88  ?Wt. (Lbs) 266 269 268.4  ?BMI 37.1 37.52 37.43  ? ? ?Physical Exam ?Vitals and nursing note reviewed.  ?Constitutional:   ?   General: He is not in acute distress. ?   Appearance: Normal appearance. He is obese. He is not ill-appearing.  ?HENT:  ?   Head: Normocephalic and atraumatic.  ?Eyes:  ?   General:     ?   Right eye: No discharge.     ?   Left eye: No discharge.  ?   Conjunctiva/sclera: Conjunctivae normal.  ?Cardiovascular:  ?   Rate and Rhythm: Normal rate and regular rhythm.  ?Pulmonary:  ?   Effort: Pulmonary effort is  normal.  ?   Breath sounds: Normal breath sounds. No wheezing, rhonchi or rales.  ?Neurological:  ?   Mental Status: He is alert.  ?Psychiatric:     ?   Mood and Affect: Mood normal.     ?   Behavior: Behavior normal.  ? ? ?Lab Results  ?Component Value Date  ? WBC 6.6 01/29/2021  ? HGB 14.7 01/29/2021  ? HCT 43.4 01/29/2021  ? PLT 186 01/29/2021  ? GLUCOSE 137 (H) 01/29/2021  ? CHOL 238 (H) 01/29/2021  ? TRIG 89 01/29/2021  ? HDL 46 01/29/2021  ? LDLCALC 176 (H) 01/29/2021  ? ALT 69 (H) 05/07/2021  ? AST 84 (H) 05/07/2021  ? NA 139 01/29/2021  ? K 4.7 01/29/2021  ? CL 100 01/29/2021  ? CREATININE 1.03 01/29/2021  ? BUN 15 01/29/2021  ? CO2 22 01/29/2021  ? TSH 6.190 (H) 08/02/2013  ? HGBA1C 6.2 (H) 01/29/2021  ? ? ? ?Assessment & Plan:  ? ?Problem List Items Addressed This Visit   ? ?  ? Cardiovascular and Mediastinum  ? Essential hypertension - Primary  ?  Stable.   Continue losartan and amlodipine. ?  ?  ? Relevant Medications  ? rosuvastatin (CRESTOR) 10 MG tablet  ?  ? Digestive  ? Fatty liver  ?  Advise continued exercise to promote weight loss.  He has cut back on his alcohol intake. ?  ?  ? Relevant Orders  ? CMP14+EGFR  ?  ? Other  ? Prediabetes  ? Relevant Orders  ? Hemoglobin A1c  ? Hyperlipidemia  ?  Starting Crestor.  Labs and 6 weeks. ?  ?  ? Relevant Medications  ? rosuvastatin (CRESTOR) 10 MG tablet  ? Other Relevant Orders  ? Lipid panel  ? ?Other Visit Diagnoses   ? ? Screening for deficiency anemia      ? Relevant Orders  ? CBC  ? Prostate cancer screening      ? Relevant Orders  ? PSA  ? ?  ? ? ?Meds ordered this encounter  ?Medications  ? rosuvastatin (CRESTOR) 10 MG tablet  ?  Sig: Take 1 tablet (10 mg total) by mouth daily.  ?  Dispense:  90 tablet  ?  Refill:  3  ? ?Thersa Salt DO ?[place] ? ?

## 2021-08-13 NOTE — Assessment & Plan Note (Signed)
Starting Crestor.  Labs and 6 weeks. ?

## 2021-08-13 NOTE — Assessment & Plan Note (Signed)
Advise continued exercise to promote weight loss.  He has cut back on his alcohol intake. ?

## 2021-08-13 NOTE — Assessment & Plan Note (Signed)
Stable.  Continue losartan and amlodipine. ?

## 2021-08-20 ENCOUNTER — Other Ambulatory Visit (HOSPITAL_COMMUNITY): Payer: Self-pay

## 2021-08-26 DIAGNOSIS — G5602 Carpal tunnel syndrome, left upper limb: Secondary | ICD-10-CM | POA: Diagnosis not present

## 2021-08-31 ENCOUNTER — Other Ambulatory Visit (HOSPITAL_COMMUNITY): Payer: Self-pay

## 2021-09-04 DIAGNOSIS — Z4789 Encounter for other orthopedic aftercare: Secondary | ICD-10-CM | POA: Diagnosis not present

## 2021-09-04 DIAGNOSIS — G5602 Carpal tunnel syndrome, left upper limb: Secondary | ICD-10-CM | POA: Diagnosis not present

## 2021-09-15 ENCOUNTER — Other Ambulatory Visit (HOSPITAL_COMMUNITY): Payer: Self-pay

## 2021-09-20 ENCOUNTER — Telehealth: Payer: 59 | Admitting: Family

## 2021-09-20 DIAGNOSIS — J069 Acute upper respiratory infection, unspecified: Secondary | ICD-10-CM | POA: Diagnosis not present

## 2021-09-20 MED ORDER — PREDNISONE 10 MG (21) PO TBPK
ORAL_TABLET | ORAL | 0 refills | Status: DC
Start: 2021-09-20 — End: 2021-09-25

## 2021-09-20 MED ORDER — BENZONATATE 100 MG PO CAPS
100.0000 mg | ORAL_CAPSULE | Freq: Three times a day (TID) | ORAL | 0 refills | Status: DC | PRN
Start: 2021-09-20 — End: 2021-11-13

## 2021-09-20 NOTE — Progress Notes (Signed)

## 2021-09-23 ENCOUNTER — Telehealth: Payer: Self-pay | Admitting: Adult Health

## 2021-09-23 ENCOUNTER — Encounter (HOSPITAL_COMMUNITY): Payer: Self-pay | Admitting: Emergency Medicine

## 2021-09-23 ENCOUNTER — Emergency Department (HOSPITAL_COMMUNITY): Payer: 59

## 2021-09-23 ENCOUNTER — Other Ambulatory Visit: Payer: Self-pay

## 2021-09-23 ENCOUNTER — Emergency Department (HOSPITAL_COMMUNITY)
Admission: EM | Admit: 2021-09-23 | Discharge: 2021-09-23 | Disposition: A | Discharge status: Home / Self Care | Payer: 59 | Attending: Emergency Medicine | Admitting: Emergency Medicine

## 2021-09-23 DIAGNOSIS — Z7951 Long term (current) use of inhaled steroids: Secondary | ICD-10-CM | POA: Insufficient documentation

## 2021-09-23 DIAGNOSIS — Z7952 Long term (current) use of systemic steroids: Secondary | ICD-10-CM | POA: Insufficient documentation

## 2021-09-23 DIAGNOSIS — R0602 Shortness of breath: Secondary | ICD-10-CM | POA: Diagnosis not present

## 2021-09-23 DIAGNOSIS — J45909 Unspecified asthma, uncomplicated: Secondary | ICD-10-CM | POA: Diagnosis not present

## 2021-09-23 DIAGNOSIS — J4550 Severe persistent asthma, uncomplicated: Secondary | ICD-10-CM | POA: Insufficient documentation

## 2021-09-23 DIAGNOSIS — J069 Acute upper respiratory infection, unspecified: Secondary | ICD-10-CM | POA: Diagnosis not present

## 2021-09-23 MED ORDER — PREDNISONE 20 MG PO TABS: 20.0000 mg | ORAL_TABLET | Freq: Two times a day (BID) | ORAL | 0 refills | Status: DC

## 2021-09-23 MED ORDER — ALBUTEROL SULFATE (2.5 MG/3ML) 0.083% IN NEBU
2.5000 mg | INHALATION_SOLUTION | Freq: Once | RESPIRATORY_TRACT | Status: AC
  Administered 2021-09-23: 2.5 mg via RESPIRATORY_TRACT
  Filled 2021-09-23: qty 3

## 2021-09-23 MED ORDER — ALBUTEROL SULFATE (2.5 MG/3ML) 0.083% IN NEBU: 2.5000 mg | INHALATION_SOLUTION | Freq: Four times a day (QID) | RESPIRATORY_TRACT | 12 refills | Status: DC | PRN

## 2021-09-23 MED ORDER — ALBUTEROL (5 MG/ML) CONTINUOUS INHALATION SOLN
10.0000 mg/h | INHALATION_SOLUTION | RESPIRATORY_TRACT | Status: AC
  Administered 2021-09-23: 10 mg/h via RESPIRATORY_TRACT

## 2021-09-23 MED ORDER — ALBUTEROL SULFATE (2.5 MG/3ML) 0.083% IN NEBU
INHALATION_SOLUTION | RESPIRATORY_TRACT | Status: AC
  Filled 2021-09-23: qty 12

## 2021-09-23 MED ORDER — DEXAMETHASONE 4 MG PO TABS
10.0000 mg | ORAL_TABLET | Freq: Once | ORAL | Status: AC
  Administered 2021-09-23: 10 mg via ORAL
  Filled 2021-09-23: qty 3

## 2021-09-23 MED ORDER — IPRATROPIUM-ALBUTEROL 0.5-2.5 (3) MG/3ML IN SOLN
3.0000 mL | Freq: Once | RESPIRATORY_TRACT | Status: AC
  Administered 2021-09-23: 3 mL via RESPIRATORY_TRACT
  Filled 2021-09-23: qty 3

## 2021-09-23 NOTE — ED Triage Notes (Signed)
Pt c/o sob about one week. Pt is on steroids and antibiotics from e-visit.  ?

## 2021-09-23 NOTE — ED Provider Notes (Signed)
?[facility] ?Provider Note ? ? ?CSN: [id number] ?Arrival date & time: 09/23/21  1856 ? ?  ? ?History ? ?Chief Complaint  ?Patient presents with  ? Shortness of Breath  ? ? ?Christopher Bowers is a 56 y.o. male. ? ?Patient presents to ER chief complaint of shortness of breath ongoing for about a week.  He states he is had a cough and congestion for about a week initially had fevers the first 2 days but has not had a fever for the past 3 to 4 days now.  He saw his primary care doctor on a virtual visit and was prescribed antibiotics which he finished and is on a prednisone taper still.  Denies any headache or chest pain no abdominal pain.  No vomiting or diarrhea. ? ? ?  ? ?Home Medications ?Prior to Admission medications   ?Medication Sig Start Date End Date Taking? Authorizing Provider  ?albuterol (PROVENTIL) (2.5 MG/3ML) 0.083% nebulizer solution Take 3 mLs (2.5 mg total) by nebulization every 6 (six) hours as needed for wheezing or shortness of breath. 09/23/21  Yes [place], Greggory Brandy, MD  ?albuterol (VENTOLIN HFA) 108 (90 Base) MCG/ACT inhaler Inhale 2 puffs into the lungs every 4 (four) hours as needed for wheezing or shortness of breath 05/11/21  Yes Cook, Jayce G, DO  ?amLODipine (NORVASC) 5 MG tablet Take 1 tablet (5 mg total) by mouth daily. 05/11/21  Yes Cook, Jayce G, DO  ?benzonatate (TESSALON PERLES) 100 MG capsule Take 1 capsule (100 mg total) by mouth 3 (three) times daily as needed. 09/20/21  Yes Hawks, Alyse Low A, FNP  ?budesonide-formoterol (SYMBICORT) 80-4.5 MCG/ACT inhaler Inhale 2 puffs into the lungs in the morning and at bedtime. 05/11/21 01/21/22 Yes Coral Spikes, DO  ?Cholecalciferol (VITAMIN D3) 5000 UNITS TABS Take 5,000 Units by mouth daily. (when he remembers)   Yes [provider]  ?esomeprazole (NEXIUM) 40 MG capsule Take 1 capsule (40 mg total) by mouth 2 (two) times daily before meals 05/11/21  Yes Cook, Jayce G, DO  ?fluticasone (FLONASE) 50 MCG/ACT nasal spray Place  2 sprays into both nostrils daily. 08/22/19  Yes Mikey Kirschner, MD  ?ibuprofen (ADVIL) 200 MG tablet Take 200 mg by mouth every 6 (six) hours as needed.   Yes [provider]  ?losartan (COZAAR) 100 MG tablet Take 1 tablet (100 mg total) by mouth daily. 05/11/21  Yes Cook, Jayce G, DO  ?predniSONE (STERAPRED UNI-PAK 21 TAB) 10 MG (21) TBPK tablet Use as directed ?Patient taking differently: 10 mg daily. 6,5,4,3,2,1 on dose 3 tablets daily 09/20/21  Yes Sharion Balloon, FNP  ?rosuvastatin (CRESTOR) 10 MG tablet Take 1 tablet (10 mg total) by mouth daily. 08/13/21  Yes Coral Spikes, DO  ?   ? ?Allergies    ?Lisinopril and Augmentin [amoxicillin-pot clavulanate]   ? ?Review of Systems   ?Review of Systems  ?Constitutional:  Negative for fever.  ?HENT:  Negative for ear pain and sore throat.   ?Eyes:  Negative for pain.  ?Respiratory:  Positive for cough and wheezing.   ?Cardiovascular:  Negative for chest pain.  ?Gastrointestinal:  Negative for abdominal pain.  ?Genitourinary:  Negative for flank pain.  ?Musculoskeletal:  Negative for back pain.  ?Skin:  Negative for color change and rash.  ?Neurological:  Negative for syncope.  ?All other systems reviewed and are negative. ? ?Physical Exam ?Updated Vital Signs ?BP (!) 147/88   Pulse 91   Temp 98.1 ?F (36.7 ?  C) (Oral)   Resp 16   Ht '5\' 11"'$  (1.803 m)   Wt 121 kg   SpO2 99%   BMI 37.21 kg/m?  ?Physical Exam ?Constitutional:   ?   Appearance: He is well-developed.  ?HENT:  ?   Head: Normocephalic.  ?   Nose: Nose normal.  ?Eyes:  ?   Extraocular Movements: Extraocular movements intact.  ?Cardiovascular:  ?   Rate and Rhythm: Normal rate.  ?Pulmonary:  ?   Effort: Pulmonary effort is normal.  ?   Breath sounds: No stridor. Wheezing present.  ?Skin: ?   Coloration: Skin is not jaundiced.  ?Neurological:  ?   Mental Status: He is alert. Mental status is at baseline.  ? ? ?ED Results / Procedures / Treatments   ?Labs ?(all labs ordered are listed, but only  abnormal results are displayed) ?Labs Reviewed - No data to display ? ?EKG ?EKG Interpretation ? ?Date/Time:  Wednesday September 23 2021 06:44:03 EDT ?Ventricular Rate:  82 ?PR Interval:  134 ?QRS Duration: 94 ?QT Interval:  374 ?QTC Calculation: 437 ?R Axis:   4 ?Text Interpretation: Sinus rhythm Left ventricular hypertrophy Confirmed by Veryl Speak [phone]) on 09/23/2021 6:49:03 AM ? ?Radiology ?DG Chest Port 1 View ? ?Result Date: 09/23/2021 ?CLINICAL DATA:  Shortness of breath for 1 week. EXAM: PORTABLE CHEST 1 VIEW COMPARISON:  06/19/2020 FINDINGS: The heart size and mediastinal contours are within normal limits. Both lungs are clear. The visualized skeletal structures are unremarkable. IMPRESSION: No active disease. Electronically Signed   By: Kathreen Devoid M.D.   On: 09/23/2021 07:52   ? ?Procedures ?Marland KitchenCritical Care ?Performed by: Luna Fuse, MD ?Authorized by: Luna Fuse, MD  ? ?Critical care provider statement:  ?  Critical care time (minutes):  40 ?  Critical care time was exclusive of:  Separately billable procedures and treating other patients and teaching time ?  Critical care was necessary to treat or prevent imminent or life-threatening deterioration of the following conditions:  Respiratory failure  ? ? ?Medications Ordered in ED ?Medications  ?albuterol (PROVENTIL,VENTOLIN) solution continuous neb (10 mg/hr Nebulization New Bag/Given 09/23/21 0949)  ?albuterol (PROVENTIL) (2.5 MG/3ML) 0.083% nebulizer solution (  Canceled Entry 09/23/21 0955)  ?dexamethasone (DECADRON) tablet 10 mg (10 mg Oral Given 09/23/21 0729)  ?ipratropium-albuterol (DUONEB) 0.5-2.5 (3) MG/3ML nebulizer solution 3 mL (3 mLs Nebulization Given 09/23/21 0731)  ?albuterol (PROVENTIL) (2.5 MG/3ML) 0.083% nebulizer solution 2.5 mg (2.5 mg Nebulization Given 09/23/21 0831)  ? ? ?ED Course/ Medical Decision Making/ A&P ?  ?                        ?Medical Decision Making ?Amount and/or Complexity of Data Reviewed ?Radiology:  ordered. ? ?Risk ?Prescription drug management. ? ? ?Cardiac monitor shows sinus rhythm. ? ?History also obtained from caregiver at bedside. ? ?Comorbidities influencing complexity include prior history of pneumonia respiratory failure and intubation. ? ?Chart review shows outpatient visit with family medicine doctor on September 20, 2021. ? ?Work-up includes chest x-ray which is unremarkable.  Vital signs show O2 saturation ranging from 91 to 94%. ? ?Patient given steroids and multiple breathing treatments.  Wheezes and symptoms appear improved.  Given multiple albuterol treatments, and a treatment of continual nebulized albuterol with significant improvement. ? ?We discussed observation stay in the hospital, however family states that they are able to procure a nebulizer machine and prefer to follow-up on an outpatient basis, requesting albuterol Nebules prescription. ? ?  I advised patient to follow-up up with their pulmonologist within the next 2 to 3 days, advised immediate return back to the ER for any worsening symptoms or any additional concerns.  Patient family expressed understanding discharged home in stable condition. ? ? ? ? ? ? ? ?Final Clinical Impression(s) / ED Diagnoses ?Final diagnoses:  ?Upper respiratory tract infection, unspecified type  ?Severe asthma, unspecified whether complicated, unspecified whether persistent  ? ? ?Rx / DC Orders ?ED Discharge Orders   ? ?      Ordered  ?  albuterol (PROVENTIL) (2.5 MG/3ML) 0.083% nebulizer solution  Every 6 hours PRN       ? 09/23/21 1015  ? ?  ?  ? ?  ? ? ?  ?Luna Fuse, MD ?09/23/21 1016 ? ?

## 2021-09-23 NOTE — Discharge Instructions (Addendum)
Call your pulmonologist today. ? ?Continue nebulized treatments at home and continue steroids at home. ? ?Return back to the ER if you have worsening symptoms. ?

## 2021-09-23 NOTE — Telephone Encounter (Signed)
Called the pt and there was no answer- LMTCB    

## 2021-09-23 NOTE — Telephone Encounter (Signed)
Spoke with the pt ?He states prescribed pred taper by ED today  ?Also neb meds to have at home given  ?He is wheezing and has some dry cough  ?He asked for rx for levaquin to "have on hand" in case pred did not help  ?I offered appt for tomorrow  ?He wants to just keep the one he has with TP for next wk  ?He is aware to call sooner if wants sooner appt  ?Pt not seen in over a year  ?Nothing further needed ?

## 2021-09-25 ENCOUNTER — Ambulatory Visit: Payer: 59 | Admitting: Nurse Practitioner

## 2021-09-25 ENCOUNTER — Other Ambulatory Visit: Payer: Self-pay

## 2021-09-25 ENCOUNTER — Encounter: Payer: Self-pay | Admitting: Nurse Practitioner

## 2021-09-25 VITALS — BP 137/90 | HR 74 | Temp 98.4°F | Ht 71.0 in | Wt 262.0 lb

## 2021-09-25 DIAGNOSIS — J4541 Moderate persistent asthma with (acute) exacerbation: Secondary | ICD-10-CM | POA: Diagnosis not present

## 2021-09-25 DIAGNOSIS — B9689 Other specified bacterial agents as the cause of diseases classified elsewhere: Secondary | ICD-10-CM | POA: Diagnosis not present

## 2021-09-25 DIAGNOSIS — J208 Acute bronchitis due to other specified organisms: Secondary | ICD-10-CM

## 2021-09-25 MED ORDER — PREDNISONE 20 MG PO TABS: ORAL_TABLET | ORAL | 0 refills | Status: DC

## 2021-09-25 MED ORDER — LEVOFLOXACIN 500 MG PO TABS: 500.0000 mg | ORAL_TABLET | Freq: Every day | ORAL | 0 refills | Status: DC

## 2021-09-25 NOTE — Progress Notes (Signed)
? ?  Subjective:  ? ? Patient ID: Christopher Bowers, male    DOB: June 21, 1965, 56 y.o.   MRN: [id number] ? ?HPI ?Cough and congestion with wheezing and SOB x 1 week  ?Has tried mucinex, symbicort and albuterol inhalers along with Neb Tx as needed. ?Went to local ER for major asthma exacerbation on 4/26.  Now on albuterol nebulizer which he took 4 times yesterday with his albuterol inhaler in between those doses.  All of the symptoms began a week ago.  Began having a fever last Saturday.  Felt somewhat better on Monday but on Tuesday symptoms began to get much worse.  No fever.  Sore throat with his coughing.  No ear pain.  Frequent cough mostly nonproductive.  States his chest is sore from coughing so much.  No headache.  Has appointment with pulmonology in 3 days.  Currently on prednisone 20 mg 1 p.o. twice daily for 3 days.  Has noticed minimal improvement.  States he had a similar problem this time last year.  Took Levaquin at that time which greatly helped his symptoms.  Non-smoker. ?Uses his Symbicort 80/4.52 puffs twice daily. ?Note patient had a very similar episode in April 2022. ? ?   ?Objective:  ? Physical Exam ?NAD.  Alert, oriented.  TMs clear effusion, no erythema.  Pharynx injected with green PND noted.  Neck supple with mild soft anterior adenopathy.  Mild shortness of breath noted with talking.  Normal color.  Mild tachypnea.  O2 sat 95%.  Lungs faint inspiratory and expiratory wheezes noted throughout lung fields with scattered faint expiratory crackles.  Heart regular rate rhythm. ? ?Today's Vitals  ? 09/25/21 1544  ?BP: 137/90  ?Pulse: 74  ?Temp: 98.4 ?F (36.9 ?C)  ?SpO2: 95%  ?Weight: 262 lb (118.8 kg)  ?Height: '5\' 11"'$  (1.803 m)  ? ?Body mass index is 36.54 kg/m?. ? ? ? ? ?   ?Assessment & Plan:  ?Acute bacterial bronchitis ? ?Moderate persistent asthma with acute exacerbation ?Meds ordered this encounter  ?Medications  ? predniSONE (DELTASONE) 20 MG tablet  ?  Sig: 3 po qd x 3 d then 2 po qd x 3 d then 1  po qd x 2 d  ?  Dispense:  17 tablet  ?  Refill:  0  ?  Order Specific Question:   Supervising Provider  ?  Answer:   Sallee Lange A [[id number]]  ? levofloxacin (LEVAQUIN) 500 MG tablet  ?  Sig: Take 1 tablet (500 mg total) by mouth daily.  ?  Dispense:  10 tablet  ?  Refill:  0  ?  Order Specific Question:   Supervising Provider  ?  Answer:   Sallee Lange A [[id number]]  ? ?Restart prednisone taper as directed. ?Add 10-day course of Levaquin. ?Continue albuterol as directed, should gradually see improvement over the next 72 hours. ?Follow-up with pulmonology on Monday as planned, go to ED or urgent care over the weekend if worse. ?Given samples of Mucinex DM for cough. ? ?

## 2021-09-27 ENCOUNTER — Encounter: Payer: Self-pay | Admitting: Nurse Practitioner

## 2021-09-28 ENCOUNTER — Encounter: Payer: Self-pay | Admitting: Nurse Practitioner

## 2021-09-28 ENCOUNTER — Ambulatory Visit: Payer: 59 | Admitting: Nurse Practitioner

## 2021-09-28 VITALS — BP 128/70 | HR 80 | Temp 97.9°F | Ht 71.0 in | Wt 263.0 lb

## 2021-09-28 DIAGNOSIS — R058 Other specified cough: Secondary | ICD-10-CM

## 2021-09-28 DIAGNOSIS — J3089 Other allergic rhinitis: Secondary | ICD-10-CM | POA: Diagnosis not present

## 2021-09-28 DIAGNOSIS — J4541 Moderate persistent asthma with (acute) exacerbation: Secondary | ICD-10-CM

## 2021-09-28 DIAGNOSIS — J309 Allergic rhinitis, unspecified: Secondary | ICD-10-CM | POA: Insufficient documentation

## 2021-09-28 DIAGNOSIS — J45991 Cough variant asthma: Secondary | ICD-10-CM | POA: Diagnosis not present

## 2021-09-28 LAB — POCT EXHALED NITRIC OXIDE: FeNO level (ppb): 10

## 2021-09-28 MED ORDER — ALBUTEROL SULFATE (2.5 MG/3ML) 0.083% IN NEBU: 2.5000 mg | INHALATION_SOLUTION | Freq: Four times a day (QID) | RESPIRATORY_TRACT | 4 refills | Status: AC | PRN

## 2021-09-28 NOTE — Assessment & Plan Note (Addendum)
I do suspect there was a large component of upper airway irritation related to his cough and symptoms given his significant nasal drainage.  Advised that he continue Flonase nasal spray and Allegra for postnasal drainage control and trigger prevention. Add in saline nasal irrigation 1-2 times a day until improved. Advised delsym 2 tsp Twice daily or change to mucinex DM for cough control. Blood tinged sputum likely related to drainage from nasal passages; given resolution with improvement of symptoms, advised to monitor and notify if this returns.  ?

## 2021-09-28 NOTE — Assessment & Plan Note (Addendum)
Somewhat controlled on Allegra. Feels like he usually flares this time of year but has ongoing allergy type symptoms through the year. Previous eosinophils 100 - consider rechecking CBC with diff and IgE once off prednisone and if elevated, send to allergist. If nl, could consider referral to ENT for further eval.  ?

## 2021-09-28 NOTE — Assessment & Plan Note (Signed)
Resolving exacerbation.  Currently on Levaquin course and extended prednisone taper prescribed by PCP. FeNO was normal today.  No evidence of bronchospasm on exam.  Overall seems to be recovering well.  Advised that he needs to be using Symbicort twice daily, regardless of symptoms.  We can discuss going to as needed dosing in the future if he has no further exacerbations.  This was his first exacerbation in almost a year requiring prednisone.  Instructed to notify us if his symptoms worsen or do not continue to improve.  Recent CXR was without acute process. ? ?Patient Instructions  ?Continue Albuterol inhaler 2 puffs or 3 mL neb every 6 hours as needed for shortness of breath or wheezing. Notify if symptoms persist despite rescue inhaler/neb use. ?Continue Symbicort 2 puffs Twice daily. Brush tongue and rinse mouth afterwards ?Continue Nexium 40 mg daily ?Continue flonase nasal spray 2 sprays each nostril ?Continue Allegra daily for allergies  ?Continue mucinex 600 mg Twice daily  ?Complete levaquin and prednisone courses as previously prescribed  ? ?Saline nasal irrigation 1-2 times a day ?Saline nasal gel At bedtime  ? ?Follow up in 4-6 weeks with Dr. Melvyn Novas or Alanson Aly. If symptoms do not improve or worsen, please contact office for sooner follow up or seek emergency care. ? ?

## 2021-09-28 NOTE — Progress Notes (Signed)
? ?'@Patient'$  ID: Christopher Bowers, male    DOB: 13-Oct-1965, 56 y.o.   MRN: [id number] ? ?Chief Complaint  ?Patient presents with  ? Acute Visit  ?  Pt states SOB for about a week now. Pt also states he has had a wheeze for about a week now also. Pt states that he has been around people that have been sick. But he thinks its the weather as well. Currently on prednisone since Wednesday. HE is on '20mg'$  as of today. Pt is also on Levaquin and started that on Friday from his PCP.   ? ? ?Referring provider: ?Coral Spikes, DO ? 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  He is a patient of Dr. Gustavus Bryant and last seen in office on 06/19/2020 by Parrett NP.  Past medical history significant for hypertension, GERD, fatty liver, obesity, GAD, MDD, prediabetes, HLD. ? ?TEST/EVENTS:  ?08/18/2020 PFTs: FVC 80, FEV1 90, ratio 87, TLC 98, DLCOunc 94.  No significant bronchodilator response; did have some mid flow reversibility ?09/23/2021 CXR 1 view: Both lungs were clear.  There was no evidence of cardiopulmonary disease. ? ?09/28/2021: Today-acute visit ?Patient presents today for increased shortness of breath, wheezing and cough.  He also had some significant nasal congestion and drainage.  His symptoms started about a week ago.  He was seen by his PCP last Sunday via virtual visit and was prescribed an antibiotic and prednisone.  He started feeling worse last Wednesday and went to the ED.  Was treated with steroid injection, nebulizer treatment and discharged with prednisone burst for suspected asthma exacerbation.  Saw PCP again on Friday for hospital follow-up.  Reported that he was feeling relatively the same.  He was prescribed Levaquin course and extended prednisone taper.  Today, he reports feeling better.  Still has a slight increase in his DOE from his baseline and his cough has not entirely resolved; however he has had significant improvement in his symptoms.  Feels like  Levaquin has made a big difference.  He was coughing up a little bit of blood and having some bloody nasal discharge last week but this has resolved.  He has not had any fevers, calf pain or swelling, chest pain.  He has been using albuterol neb twice a day.  He did restart using his Symbicort twice daily.  He does report that he does not consistently use this every day.  Does have a history of significant allergy symptoms and takes Allegra. ? ?FeNO 10 ppb ? ?Allergies  ?Allergen Reactions  ? Lisinopril   ? Augmentin [Amoxicillin-Pot Clavulanate] Rash  ? ? ?Immunization History  ?Administered Date(s) Administered  ? Influenza Split 03/19/2013, 02/28/2014, 02/20/2018  ? Influenza,inj,Quad PF,6+ Mos 02/20/2018, 06/19/2020  ? Influenza-Unspecified 03/10/2015, 05/05/2016, 04/03/2019, 05/07/2021  ? Moderna Sars-Covid-2 Vaccination 02/28/2020, 03/17/2020  ? Pneumococcal Polysaccharide-23 05/09/2013  ? ? ?Past Medical History:  ?Diagnosis Date  ? Carpal tunnel syndrome of right wrist   ? GERD (gastroesophageal reflux disease)   ? History of acute respiratory failure   ? Feb 2014  w/  CAP and ARDS  ---  resolved  ? History of exercise stress test   ? 01-07-2010   -- negative for ischemia, no chest pain,  hypertensive response to exercise  ? History of gastritis   ? History of hiatal hernia   ? Hypertension   ? OSA (obstructive sleep apnea)   ? per pt study done 2013 OSA intolerant cpap  ,  pt uses oral appliance  ? Seizure ([place]) 11/27/2020  ? ? ?Tobacco History: ?Social History  ? ?Tobacco Use  ?Smoking Status Never  ?Smokeless Tobacco Current  ? Types: Snuff  ? Last attempt to quit: 02/26/2015  ?Tobacco Comments  ? "occasional snuff"  ? ?Ready to quit: Not Answered ?Counseling given: Not Answered ?Tobacco comments: "occasional snuff" ? ? ?Outpatient Medications Prior to Visit  ?Medication Sig Dispense Refill  ? albuterol (VENTOLIN HFA) 108 (90 Base) MCG/ACT inhaler Inhale 2 puffs into the lungs every 4 (four) hours as  needed for wheezing or shortness of breath 18 g 2  ? amLODipine (NORVASC) 5 MG tablet Take 1 tablet (5 mg total) by mouth daily. 90 tablet 1  ? benzonatate (TESSALON PERLES) 100 MG capsule Take 1 capsule (100 mg total) by mouth 3 (three) times daily as needed. 20 capsule 0  ? budesonide-formoterol (SYMBICORT) 80-4.5 MCG/ACT inhaler Inhale 2 puffs into the lungs in the morning and at bedtime. 10.2 g 4  ? Cholecalciferol (VITAMIN D3) 5000 UNITS TABS Take 5,000 Units by mouth daily. (when he remembers)    ? esomeprazole (NEXIUM) 40 MG capsule Take 1 capsule (40 mg total) by mouth 2 (two) times daily before meals 180 capsule 1  ? fluticasone (FLONASE) 50 MCG/ACT nasal spray Place 2 sprays into both nostrils daily. 16 g 1  ? ibuprofen (ADVIL) 200 MG tablet Take 200 mg by mouth every 6 (six) hours as needed.    ? levofloxacin (LEVAQUIN) 500 MG tablet Take 1 tablet (500 mg total) by mouth daily. 10 tablet 0  ? losartan (COZAAR) 100 MG tablet Take 1 tablet (100 mg total) by mouth daily. 90 tablet 1  ? predniSONE (DELTASONE) 20 MG tablet 3 po qd x 3 d then 2 po qd x 3 d then 1 po qd x 2 d 17 tablet 0  ? rosuvastatin (CRESTOR) 10 MG tablet Take 1 tablet (10 mg total) by mouth daily. 90 tablet 3  ? albuterol (PROVENTIL) (2.5 MG/3ML) 0.083% nebulizer solution Take 3 mLs (2.5 mg total) by nebulization every 6 (six) hours as needed for wheezing or shortness of breath. 75 mL 12  ? ?No facility-administered medications prior to visit.  ? ? ? ?Review of Systems:  ? ?Constitutional: No weight loss or gain, night sweats, fevers, chills, fatigue, or lassitude. ?HEENT: No headaches, difficulty swallowing, tooth/dental problems, or sore throat. No sneezing, itching, ear ache +nasal congestion, post nasal drip (improved) ?CV:  No chest pain, orthopnea, PND, swelling in lower extremities, anasarca, dizziness, palpitations, syncope ?Resp: +shortness of breath with exertion (improving); productive cough (improving); minimal wheeze  (significantly improved). No excess mucus or change in color of mucus. No hemoptysis.  No chest wall deformity ?GI:  No heartburn, indigestion, abdominal pain, nausea, vomiting, diarrhea, change in bowel habits, loss of appetite, bloody stools.  ?Skin: No rash, lesions, ulcerations ?MSK:  No joint pain or swelling.  No decreased range of motion.  No back pain. ?Neuro: No dizziness or lightheadedness.  ?Psych: No depression or anxiety. Mood stable.  ? ? ? ?Physical Exam: ? ?BP 128/70 (BP Location: Right Arm, Patient Position: Sitting, Cuff Size: Normal)   Pulse 80   Temp 97.9 ?F (36.6 ?C) (Oral)   Ht '5\' 11"'$  (1.803 m)   Wt 263 lb (119.3 kg)   SpO2 95%   BMI 36.68 kg/m?  ? ?GEN: Pleasant, interactive, well-appearing; obese; in no acute distress. ?HEENT:  Normocephalic and atraumatic.  PERRLA. Sclera white. Nasal turbinates erythematous,  moist and patent bilaterally.  Clear rhinorrhea present. Oropharynx erythematous and moist, without exudate or edema. No lesions, ulcerations  ?NECK:  Supple w/ fair ROM. No JVD present. Normal carotid impulses w/o bruits. Thyroid symmetrical with no goiter or nodules palpated. No lymphadenopathy.   ?CV: RRR, no m/r/g, no peripheral edema. Pulses intact, +2 bilaterally. No cyanosis, pallor or clubbing. ?PULMONARY:  Unlabored, regular breathing. Clear bilaterally A&P w/o wheezes/rales/rhonchi. No accessory muscle use. No dullness to percussion. ?GI: BS present and normoactive. Soft, non-tender to palpation. No organomegaly or masses detected. No CVA tenderness. ?MSK: No erythema, warmth or tenderness. Cap refil <2 sec all extrem. No deformities or joint swelling noted.  ?Neuro: A/Ox3. No focal deficits noted.   ?Skin: Warm, no lesions or rashe ?Psych: Normal affect and behavior. Judgement and thought content appropriate.  ? ? ? ?Lab Results: ? ?CBC ?   ?Component Value Date/Time  ? WBC 6.6 01/29/2021 0817  ? WBC 10.9 (H) 03/22/2017 1710  ? RBC 4.56 01/29/2021 0817  ? RBC 5.20  03/22/2017 1710  ? HGB 14.7 01/29/2021 0817  ? HCT 43.4 01/29/2021 0817  ? PLT 186 01/29/2021 0817  ? MCV 95 01/29/2021 0817  ? MCH 32.2 01/29/2021 0817  ? MCH 31.0 09/26/2014 0740  ? MCHC 33.9 01/29/2021 0817  ? MCHC 33.6 10/2

## 2021-09-28 NOTE — Patient Instructions (Addendum)
Continue Albuterol inhaler 2 puffs or 3 mL neb every 6 hours as needed for shortness of breath or wheezing. Notify if symptoms persist despite rescue inhaler/neb use. ?Continue Symbicort 2 puffs Twice daily. Brush tongue and rinse mouth afterwards ?Continue Nexium 40 mg daily ?Continue flonase nasal spray 2 sprays each nostril ?Continue Allegra daily for allergies  ?Continue mucinex 600 mg Twice daily  ?Complete levaquin and prednisone courses as previously prescribed  ? ?Saline nasal irrigation 1-2 times a day ?Saline nasal gel At bedtime  ? ?Follow up in 4-6 weeks with Dr. Melvyn Novas or Alanson Aly. If symptoms do not improve or worsen, please contact office for sooner follow up or seek emergency care. ?

## 2021-10-02 ENCOUNTER — Ambulatory Visit: Payer: 59 | Admitting: Adult Health

## 2021-10-19 ENCOUNTER — Other Ambulatory Visit (HOSPITAL_COMMUNITY): Payer: Self-pay

## 2021-10-19 ENCOUNTER — Other Ambulatory Visit: Payer: Self-pay | Admitting: Family Medicine

## 2021-10-19 DIAGNOSIS — I10 Essential (primary) hypertension: Secondary | ICD-10-CM

## 2021-10-19 MED ORDER — ALBUTEROL SULFATE HFA 108 (90 BASE) MCG/ACT IN AERS
2.0000 | INHALATION_SPRAY | RESPIRATORY_TRACT | 2 refills | Status: DC | PRN
  Filled 2021-10-19: qty 18, 16d supply, fill #0
  Filled 2021-11-06: qty 18, 16d supply, fill #1
  Filled 2022-01-25: qty 6.7, 16d supply, fill #2
  Filled 2022-09-22: qty 6.7, 17d supply, fill #2

## 2021-10-19 MED ORDER — LOSARTAN POTASSIUM 100 MG PO TABS
100.0000 mg | ORAL_TABLET | Freq: Every day | ORAL | 1 refills | Status: DC
  Filled 2021-10-19 – 2021-11-06 (×2): qty 90, 90d supply, fill #0
  Filled 2022-02-04: qty 90, 90d supply, fill #1

## 2021-11-04 ENCOUNTER — Ambulatory Visit: Payer: 59 | Admitting: Family Medicine

## 2021-11-06 ENCOUNTER — Other Ambulatory Visit: Payer: Self-pay | Admitting: Family Medicine

## 2021-11-06 ENCOUNTER — Other Ambulatory Visit (HOSPITAL_COMMUNITY): Payer: Self-pay

## 2021-11-06 DIAGNOSIS — I10 Essential (primary) hypertension: Secondary | ICD-10-CM

## 2021-11-06 MED ORDER — AMLODIPINE BESYLATE 5 MG PO TABS
5.0000 mg | ORAL_TABLET | Freq: Every day | ORAL | 1 refills | Status: DC
  Filled 2021-11-06: qty 90, 90d supply, fill #0
  Filled 2022-02-04: qty 90, 90d supply, fill #1

## 2021-11-11 DIAGNOSIS — E7800 Pure hypercholesterolemia, unspecified: Secondary | ICD-10-CM | POA: Diagnosis not present

## 2021-11-11 DIAGNOSIS — Z130 Encounter for screening for diseases of the blood and blood-forming organs and certain disorders involving the immune mechanism: Secondary | ICD-10-CM | POA: Diagnosis not present

## 2021-11-11 DIAGNOSIS — Z125 Encounter for screening for malignant neoplasm of prostate: Secondary | ICD-10-CM | POA: Diagnosis not present

## 2021-11-11 DIAGNOSIS — R7303 Prediabetes: Secondary | ICD-10-CM | POA: Diagnosis not present

## 2021-11-11 DIAGNOSIS — K760 Fatty (change of) liver, not elsewhere classified: Secondary | ICD-10-CM | POA: Diagnosis not present

## 2021-11-12 LAB — CBC
Hematocrit: 42.3 % (ref 37.5–51.0)
Hemoglobin: 14.1 g/dL (ref 13.0–17.7)
MCH: 31.5 pg (ref 26.6–33.0)
MCHC: 33.3 g/dL (ref 31.5–35.7)
MCV: 94 fL (ref 79–97)
Platelets: 144 10*3/uL — ABNORMAL LOW (ref 150–450)
RBC: 4.48 x10E6/uL (ref 4.14–5.80)
RDW: 13.1 % (ref 11.6–15.4)
WBC: 6.7 10*3/uL (ref 3.4–10.8)

## 2021-11-12 LAB — CMP14+EGFR
ALT: 77 IU/L — ABNORMAL HIGH (ref 0–44)
AST: 80 IU/L — ABNORMAL HIGH (ref 0–40)
Albumin/Globulin Ratio: 2.1 (ref 1.2–2.2)
Albumin: 4.7 g/dL (ref 3.8–4.9)
Alkaline Phosphatase: 76 IU/L (ref 44–121)
BUN/Creatinine Ratio: 13 (ref 9–20)
BUN: 12 mg/dL (ref 6–24)
Bilirubin Total: 0.9 mg/dL (ref 0.0–1.2)
CO2: 22 mmol/L (ref 20–29)
Calcium: 9.4 mg/dL (ref 8.7–10.2)
Chloride: 103 mmol/L (ref 96–106)
Creatinine, Ser: 0.92 mg/dL (ref 0.76–1.27)
Globulin, Total: 2.2 g/dL (ref 1.5–4.5)
Glucose: 157 mg/dL — ABNORMAL HIGH (ref 70–99)
Potassium: 4.8 mmol/L (ref 3.5–5.2)
Sodium: 140 mmol/L (ref 134–144)
Total Protein: 6.9 g/dL (ref 6.0–8.5)
eGFR: 98 mL/min/{1.73_m2} (ref >59)

## 2021-11-12 LAB — PSA: Prostate Specific Ag, Serum: 0.9 ng/mL (ref 0.0–4.0)

## 2021-11-12 LAB — LIPID PANEL
Chol/HDL Ratio: 3.3 ratio (ref 0.0–5.0)
Cholesterol, Total: 155 mg/dL (ref 100–199)
HDL: 47 mg/dL (ref >39)
LDL Chol Calc (NIH): 94 mg/dL (ref 0–99)
Triglycerides: 69 mg/dL (ref 0–149)
VLDL Cholesterol Cal: 14 mg/dL (ref 5–40)

## 2021-11-12 LAB — HEMOGLOBIN A1C
Est. average glucose Bld gHb Est-mCnc: 166 mg/dL
Hgb A1c MFr Bld: 7.4 % — ABNORMAL HIGH (ref 4.8–5.6)

## 2021-11-13 ENCOUNTER — Ambulatory Visit: Payer: 59 | Admitting: Nurse Practitioner

## 2021-11-13 ENCOUNTER — Ambulatory Visit: Payer: 59 | Admitting: Family Medicine

## 2021-11-13 ENCOUNTER — Encounter: Payer: Self-pay | Admitting: Nurse Practitioner

## 2021-11-13 VITALS — BP 117/65 | Ht 71.0 in | Wt 266.8 lb

## 2021-11-13 DIAGNOSIS — I10 Essential (primary) hypertension: Secondary | ICD-10-CM

## 2021-11-13 DIAGNOSIS — J4540 Moderate persistent asthma, uncomplicated: Secondary | ICD-10-CM

## 2021-11-13 DIAGNOSIS — E7800 Pure hypercholesterolemia, unspecified: Secondary | ICD-10-CM | POA: Diagnosis not present

## 2021-11-13 DIAGNOSIS — R600 Localized edema: Secondary | ICD-10-CM | POA: Insufficient documentation

## 2021-11-13 DIAGNOSIS — E119 Type 2 diabetes mellitus without complications: Secondary | ICD-10-CM | POA: Diagnosis not present

## 2021-11-13 DIAGNOSIS — R0602 Shortness of breath: Secondary | ICD-10-CM | POA: Diagnosis not present

## 2021-11-13 NOTE — Assessment & Plan Note (Signed)
Resolved exacerbation.  Clinically improved with breathing at his baseline.  Recommended that he continue on Symbicort and as needed albuterol.  Continue Allegra for trigger prevention.  Can use Mucinex as needed for congestion.  Advised that he notify us if he starts to use albuterol more frequently or develops frequent cough again.  Patient Instructions  Continue Albuterol inhaler 2 puffs or 3 mL neb every 6 hours as needed for shortness of breath or wheezing. Notify if symptoms persist despite rescue inhaler/neb use. Continue Symbicort 2 puffs Twice daily. Brush tongue and rinse mouth afterwards Continue Nexium 40 mg daily Continue flonase nasal spray 2 sprays each nostril Continue Allegra daily for allergies  Continue mucinex 600 mg Twice daily as needed for chest congestion/cough   Follow up in 6 months with Christopher Bowers or Christopher Corday Wyka,NP. If symptoms do not improve or worsen, please contact office for sooner follow up or seek emergency care.

## 2021-11-13 NOTE — Progress Notes (Signed)
$'[name]$  ID: Christopher Bowers, male    DOB: February 17, 1966, 56 y.o.   MRN: [id number]  Chief Complaint  Patient presents with   Follow-up    Patient has no complaints.     Referring provider: Coral Spikes, DO  HPI: 56 year old male, never smoker followed for cough variant asthma and upper airway cough syndrome.  He is a patient of Dr. Gustavus Bryant and last seen in office on 06/19/2020 by Parrett NP.  Past medical history significant for hypertension, GERD, fatty liver, obesity, GAD, MDD, prediabetes, HLD.  TEST/EVENTS:  08/18/2020 PFTs: FVC 80, FEV1 90, ratio 87, TLC 98, DLCOunc 94.  No significant bronchodilator response; did have some mid flow reversibility 09/23/2021 CXR 1 view: Both lungs were clear.  There was no evidence of cardiopulmonary disease.  09/28/2021: OV with Everlynn Sagun NP for increased shortness of breath, wheezing and cough.  He also had some significant nasal congestion and drainage.  His symptoms started about a week ago.  He was seen by his PCP last Sunday via virtual visit and was prescribed an antibiotic and prednisone.  He started feeling worse last Wednesday and went to the ED.  Was treated with steroid injection, nebulizer treatment and discharged with prednisone burst for suspected asthma exacerbation.  Saw PCP again on Friday for hospital follow-up.  Reported that he was feeling relatively the same.  He was prescribed Levaquin course and extended prednisone taper.  Today, he reports feeling better.  Still has a slight increase in his DOE from his baseline and his cough has not entirely resolved; however he has had significant improvement in his symptoms.  Feels like Levaquin has made a big difference.  He was coughing up a little bit of blood and having some bloody nasal discharge last week but this has resolved.  He has not had any fevers, calf pain or swelling, chest pain.  He has been using albuterol neb twice a day.  He did restart using his Symbicort twice daily.  He does report that he  does not consistently use this every day.  Does have a history of significant allergy symptoms and takes Allegra. Seemed exacerbation was resolving with FeNO nl. Advised to complete levaquin and prednisone as previously prescribed. Discussed cough control and advised him on importance of compliance with maintenance inhaler therapy. Possible given his bloody nasal drainage that his hemoptysis was related to this; advised he use saline nasal gel at bedtime and back off on flonase if symptoms return.  11/13/2021: Today-follow-up Patient presents today for follow-up.  He is doing significantly better.  Cough has entirely resolved and no more episodes of hemoptysis.  He was able to complete his antibiotics and steroids without any difficulties.  Previous x-rays were all clear.  He does have some occasional swelling in his lower extremities, which usually occurs when he is out in the heat or sitting for a long time.  His wife is worried about this so he went and saw his PCP earlier today regarding this, BNP was drawn but has not resulted yet.  He has no significant cardiac history.  Does take amlodipine for hypertension.  He continues on Symbicort twice daily.  Has not had to use his rescue inhaler.  Does still take Mucinex every now and then if he feels like he is getting a little congested.  Takes Allegra daily for allergies.  Allergies  Allergen Reactions   Lisinopril    Augmentin [Amoxicillin-Pot Clavulanate] Rash    Immunization History  Administered Date(s)  Administered   Influenza Split 03/19/2013, 02/28/2014, 02/20/2018   Influenza,inj,Quad PF,6+ Mos 02/20/2018, 06/19/2020   Influenza-Unspecified 03/10/2015, 05/05/2016, 04/03/2019, 05/07/2021   Moderna Sars-Covid-2 Vaccination 02/28/2020, 03/17/2020   Pneumococcal Polysaccharide-23 05/09/2013    Past Medical History:  Diagnosis Date   Carpal tunnel syndrome of right wrist    GERD (gastroesophageal reflux disease)    History of acute  respiratory failure    Feb 2014  w/  CAP and ARDS  ---  resolved   History of exercise stress test    01-07-2010   -- negative for ischemia, no chest pain,  hypertensive response to exercise   History of gastritis    History of hiatal hernia    Hypertension    OSA (obstructive sleep apnea)    per pt study done 2013 OSA intolerant cpap  ,  pt uses oral appliance   Seizure ([place]) 11/27/2020    Tobacco History: Social History   Tobacco Use  Smoking Status Never  Smokeless Tobacco Current   Types: Snuff   Last attempt to quit: 02/26/2015  Tobacco Comments   "occasional snuff"   Ready to quit: Not Answered Counseling given: Not Answered Tobacco comments: "occasional snuff"   Outpatient Medications Prior to Visit  Medication Sig Dispense Refill   albuterol (PROVENTIL) (2.5 MG/3ML) 0.083% nebulizer solution Take 3 mLs (2.5 mg total) by nebulization every 6 (six) hours as needed for wheezing or shortness of breath. 75 mL 4   albuterol (VENTOLIN HFA) 108 (90 Base) MCG/ACT inhaler Inhale 2 puffs into the lungs every 4 (four) hours as needed for wheezing or shortness of breath 18 g 2   amLODipine (NORVASC) 5 MG tablet Take 1 tablet (5 mg total) by mouth daily. 90 tablet 1   budesonide-formoterol (SYMBICORT) 80-4.5 MCG/ACT inhaler Inhale 2 puffs into the lungs in the morning and at bedtime. 10.2 g 4   Cholecalciferol (VITAMIN D3) 5000 UNITS TABS Take 5,000 Units by mouth daily. (when he remembers)     esomeprazole (NEXIUM) 40 MG capsule Take 1 capsule (40 mg total) by mouth 2 (two) times daily before meals 180 capsule 1   fluticasone (FLONASE) 50 MCG/ACT nasal spray Place 2 sprays into both nostrils daily. 16 g 1   ibuprofen (ADVIL) 200 MG tablet Take 200 mg by mouth every 6 (six) hours as needed.     losartan (COZAAR) 100 MG tablet Take 1 tablet (100 mg total) by mouth daily. 90 tablet 1   rosuvastatin (CRESTOR) 10 MG tablet Take 1 tablet (10 mg total) by mouth daily. 90 tablet 3   No  facility-administered medications prior to visit.     Review of Systems:   Constitutional: No weight loss or gain, night sweats, fevers, chills, fatigue, or lassitude. HEENT: No headaches, difficulty swallowing, tooth/dental problems, or sore throat. No sneezing, itching, ear ache, nasal congestion, post nasal drip CV:  + Intermittent swelling in lower extremities. No chest pain, orthopnea, PND, anasarca, dizziness, palpitations, syncope Resp: +shortness of breath with strenuous activity (baseline). No excess mucus or change in color of mucus.  No cough.  No wheeze.  No hemoptysis.  No chest wall deformity GI:  No heartburn, indigestion, abdominal pain, nausea, vomiting, diarrhea, change in bowel habits, loss of appetite, bloody stools.  Skin: No rash, lesions, ulcerations MSK:  No joint pain or swelling.  No decreased range of motion.  No back pain. Neuro: No dizziness or lightheadedness.  Psych: No depression or anxiety. Mood stable.     Physical Exam:  BP 122/74 (BP Location: Right Arm, Patient Position: Sitting, Cuff Size: Large)   Pulse 87   Temp 98.5 F (36.9 C) (Oral)   Ht '5\' 11"'$  (1.803 m)   Wt 265 lb 12.8 oz (120.6 kg)   SpO2 95%   BMI 37.07 kg/m   GEN: Pleasant, interactive, well-appearing; obese; in no acute distress. HEENT:  Normocephalic and atraumatic.  PERRLA. Sclera white. Nasal turbinates pink, moist and patent bilaterally.  No rhinorrhea present. Oropharynx pink and moist, without exudate or edema. No lesions, ulcerations  NECK:  Supple w/ fair ROM. No JVD present. Normal carotid impulses w/o bruits. Thyroid symmetrical with no goiter or nodules palpated. No lymphadenopathy.   CV: RRR, no m/r/g, no peripheral edema. Pulses intact, +2 bilaterally. No cyanosis, pallor or clubbing. PULMONARY:  Unlabored, regular breathing. Clear bilaterally A&P w/o wheezes/rales/rhonchi. No accessory muscle use. No dullness to percussion. GI: BS present and normoactive. Soft,  non-tender to palpation. No organomegaly or masses detected. No CVA tenderness. MSK: No erythema, warmth or tenderness. Cap refil <2 sec all extrem. No deformities or joint swelling noted.  Neuro: A/Ox3. No focal deficits noted.   Skin: Warm, no lesions or rashe Psych: Normal affect and behavior. Judgement and thought content appropriate.     Lab Results:  CBC    Component Value Date/Time   WBC 6.7 11/11/2021 0835   WBC 10.9 (H) 03/22/2017 1710   RBC 4.48 11/11/2021 0835   RBC 5.20 03/22/2017 1710   HGB 14.1 11/11/2021 0835   HCT 42.3 11/11/2021 0835   PLT 144 (L) 11/11/2021 0835   MCV 94 11/11/2021 0835   MCH 31.5 11/11/2021 0835   MCH 31.0 09/26/2014 0740   MCHC 33.3 11/11/2021 0835   MCHC 33.6 03/22/2017 1710   RDW 13.1 11/11/2021 0835   LYMPHSABS 2.5 07/04/2020 0821   MONOABS 1.0 03/22/2017 1710   EOSABS 0.1 07/04/2020 0821   BASOSABS 0.1 07/04/2020 0821    BMET    Component Value Date/Time   NA 140 11/11/2021 0835   K 4.8 11/11/2021 0835   CL 103 11/11/2021 0835   CO2 22 11/11/2021 0835   GLUCOSE 157 (H) 11/11/2021 0835   GLUCOSE 134 (H) 05/01/2015 1146   BUN 12 11/11/2021 0835   CREATININE 0.92 11/11/2021 0835   CREATININE 1.16 03/14/2013 1236   CALCIUM 9.4 11/11/2021 0835   GFRNONAA 72 07/04/2020 0821   GFRAA 83 07/04/2020 0821    BNP    Component Value Date/Time   BNP 17.0 09/26/2014 0740     Imaging:  No results found.       Latest Ref Rng & Units 08/18/2020    3:44 PM  PFT Results  FVC-Pre L 4.04   FVC-Predicted Pre % 80   FVC-Post L 4.06   FVC-Predicted Post % 80   Pre FEV1/FVC % % 85   Post FEV1/FCV % % 87   FEV1-Pre L 3.44   FEV1-Predicted Pre % 88   FEV1-Post L 3.52   DLCO uncorrected ml/min/mmHg 27.45   DLCO UNC% % 94   DLVA Predicted % 112   TLC L 6.99   TLC % Predicted % 98   RV % Predicted % 129     Lab Results  Component Value Date   NITRICOXIDE 108 03/22/2017        Assessment & Plan:   Asthmatic  bronchitis, moderate persistent, uncomplicated Resolved exacerbation.  Clinically improved with breathing at his baseline.  Recommended that he continue on Symbicort and as  needed albuterol.  Continue Allegra for trigger prevention.  Can use Mucinex as needed for congestion.  Advised that he notify us if he starts to use albuterol more frequently or develops frequent cough again.  Patient Instructions  Continue Albuterol inhaler 2 puffs or 3 mL neb every 6 hours as needed for shortness of breath or wheezing. Notify if symptoms persist despite rescue inhaler/neb use. Continue Symbicort 2 puffs Twice daily. Brush tongue and rinse mouth afterwards Continue Nexium 40 mg daily Continue flonase nasal spray 2 sprays each nostril Continue Allegra daily for allergies  Continue mucinex 600 mg Twice daily as needed for chest congestion/cough   Follow up in 6 months with Dr. Melvyn Novas or Katie Cortavious Nix,NP. If symptoms do not improve or worsen, please contact office for sooner follow up or seek emergency care.    Bilateral lower extremity edema No significant cardiac history.  Appears euvolemic on exam today.  Being worked up by PCP.  He is on amlodipine so this could be the contributing cause; although suspect it is just some venous insufficiency given that it happens with sitting for extended periods or heat.   I spent 28 minutes of dedicated to the care of this patient on the date of this encounter to include pre-visit review of records, face-to-face time with the patient discussing conditions above, post visit ordering of testing, clinical documentation with the electronic health record, making appropriate referrals as documented, and communicating necessary findings to members of the patients care team.  Clayton Bibles, NP 11/13/2021  Pt aware and understands NP's role.

## 2021-11-13 NOTE — Patient Instructions (Signed)
Continue your current medications.  Keep working hard.  Follow up in December.  Dr. Lacinda Axon

## 2021-11-13 NOTE — Assessment & Plan Note (Signed)
No significant cardiac history.  Appears euvolemic on exam today.  Being worked up by PCP.  He is on amlodipine so this could be the contributing cause; although suspect it is just some venous insufficiency given that it happens with sitting for extended periods or heat.

## 2021-11-13 NOTE — Patient Instructions (Addendum)
Continue Albuterol inhaler 2 puffs or 3 mL neb every 6 hours as needed for shortness of breath or wheezing. Notify if symptoms persist despite rescue inhaler/neb use. Continue Symbicort 2 puffs Twice daily. Brush tongue and rinse mouth afterwards Continue Nexium 40 mg daily Continue flonase nasal spray 2 sprays each nostril Continue Allegra daily for allergies  Continue mucinex 600 mg Twice daily as needed for chest congestion/cough   Follow up in 6 months with Dr. Melvyn Novas or Katie Rickell Wiehe,NP. If symptoms do not improve or worsen, please contact office for sooner follow up or seek emergency care.

## 2021-11-14 ENCOUNTER — Other Ambulatory Visit (HOSPITAL_COMMUNITY): Payer: Self-pay

## 2021-11-14 LAB — BRAIN NATRIURETIC PEPTIDE: BNP: 16.7 pg/mL (ref 0.0–100.0)

## 2021-11-16 DIAGNOSIS — E119 Type 2 diabetes mellitus without complications: Secondary | ICD-10-CM | POA: Insufficient documentation

## 2021-11-16 DIAGNOSIS — R0602 Shortness of breath: Secondary | ICD-10-CM | POA: Insufficient documentation

## 2021-11-16 NOTE — Progress Notes (Signed)
Subjective:  Patient ID: Christopher Bowers, male    DOB: 11/06/1965  Age: 56 y.o. MRN: [id number]  CC: Chief Complaint  Patient presents with   Hypertension   Hyperlipidemia    Follow up on starting cholesterol meds Follow up on recent labs    HPI:  56 year old male with hypertension, OSA, hepatic steatosis, hyperlipidemia presents for follow-up.  Hypertension is at goal on amlodipine and losartan.  Patient's recent lab work revealed an A1c of 7.4.  New diagnosis of type 2 diabetes.  We will discuss treatment options and lifestyle changes today.  Lipids stable and relatively well controlled on Crestor.  Most recent LDL 94.   Patient reports that he has ongoing lower extremity edema.  Worse at the end of the day.  Improves with elevation.  He states that he has some ongoing shortness of breath.  His wife is concerned that he may have heart issues.  Denies chest pain.   Patient Active Problem List   Diagnosis Date Noted   SOB (shortness of breath) 11/16/2021   Type 2 diabetes mellitus ([place]) 11/16/2021   Bilateral lower extremity edema 11/13/2021   Upper airway cough syndrome 09/28/2021   Allergic rhinitis 09/28/2021   Hyperlipidemia 05/11/2021   GERD (gastroesophageal reflux disease) 05/11/2021   Fatty liver 02/03/2021   Major depressive disorder with single episode, in remission ([place]) 01/08/2020   Asthmatic bronchitis, moderate persistent, uncomplicated 78/29/5621   Generalized anxiety disorder 02/05/2017   Obstructive sleep apnea 07/21/2016   Obesity 07/08/2012   Essential hypertension 07/08/2012    Social Hx   Social History   Socioeconomic History   Marital status: Married    Spouse name: Not on file   Number of children: 1   Years of education: college   Highest education level: Not on file  Occupational History   Occupation: Scientist, clinical (histocompatibility and immunogenetics): [employer]    Comment: truck  Tobacco Use   Smoking status: Never   Smokeless tobacco: Current    Types:  Snuff    Last attempt to quit: 02/26/2015   Tobacco comments:    "occasional snuff"  Vaping Use   Vaping Use: Never used  Substance and Sexual Activity   Alcohol use: Yes    Alcohol/week: 5.0 standard drinks of alcohol    Types: 5 Shots of liquor per week    Comment: 5-6 liquor drinks per day   Drug use: No   Sexual activity: Not on file  Other Topics Concern   Not on file  Social History Narrative   Patient drinks about 3 cups of caffeine daily.   Patient is right handed.   Social Determinants of Health   Financial Resource Strain: Not on file  Food Insecurity: Not on file  Transportation Needs: Not on file  Physical Activity: Not on file  Stress: Not on file  Social Connections: Not on file    Review of Systems Per HPI  Objective:  BP 117/65   Ht '5\' 11"'$  (1.803 m)   Wt 266 lb 12.8 oz (121 kg)   BMI 37.21 kg/m      11/13/2021    4:41 PM 11/13/2021    1:58 PM 09/28/2021    9:31 AM  BP/Weight  Systolic BP 308 657 846  Diastolic BP 74 65 70  Wt. (Lbs) 265.8 266.8 263  BMI 37.07 kg/m2 37.21 kg/m2 36.68 kg/m2    Physical Exam Vitals and nursing note reviewed.  Constitutional:  General: He is not in acute distress.    Appearance: Normal appearance. He is obese.  HENT:     Head: Normocephalic and atraumatic.  Cardiovascular:     Rate and Rhythm: Normal rate and regular rhythm.  Pulmonary:     Effort: Pulmonary effort is normal.     Breath sounds: Normal breath sounds. No wheezing, rhonchi or rales.  Neurological:     Mental Status: He is alert.  Psychiatric:        Mood and Affect: Mood normal.        Behavior: Behavior normal.     Lab Results  Component Value Date   WBC 6.7 11/11/2021   HGB 14.1 11/11/2021   HCT 42.3 11/11/2021   PLT 144 (L) 11/11/2021   GLUCOSE 157 (H) 11/11/2021   CHOL 155 11/11/2021   TRIG 69 11/11/2021   HDL 47 11/11/2021   LDLCALC 94 11/11/2021   ALT 77 (H) 11/11/2021   AST 80 (H) 11/11/2021   NA 140 11/11/2021   K  4.8 11/11/2021   CL 103 11/11/2021   CREATININE 0.92 11/11/2021   BUN 12 11/11/2021   CO2 22 11/11/2021   TSH 6.190 (H) 08/02/2013   HGBA1C 7.4 (H) 11/11/2021     Assessment & Plan:   Problem List Items Addressed This Visit       Cardiovascular and Mediastinum   Essential hypertension - Primary    At goal.  Continue amlodipine and losartan.        Endocrine   Type 2 diabetes mellitus ([place])    New diagnosis.  Discussed dietary and lifestyle change.  Also discussed pharmacotherapy.  Patient wanted to try a trial of dietary and lifestyle changes before pursuing medication.  Follow-up in 6 months.        Other   Hyperlipidemia    Stable.  Continue Crestor.      SOB (shortness of breath)    BNP obtained and was normal.  Patient is followed by pulmonology.      Relevant Orders   Brain natriuretic peptide (Completed)    Follow-up:  Return in about 6 months (around 05/15/2022).  [place]

## 2021-11-16 NOTE — Assessment & Plan Note (Signed)
BNP obtained and was normal.  Patient is followed by pulmonology.

## 2021-11-16 NOTE — Assessment & Plan Note (Signed)
Stable. Continue Crestor. 

## 2021-11-16 NOTE — Assessment & Plan Note (Signed)
At goal. Continue amlodipine and losartan.  

## 2021-11-16 NOTE — Assessment & Plan Note (Signed)
New diagnosis.  Discussed dietary and lifestyle change.  Also discussed pharmacotherapy.  Patient wanted to try a trial of dietary and lifestyle changes before pursuing medication.  Follow-up in 6 months.

## 2021-12-07 ENCOUNTER — Telehealth: Payer: Self-pay | Admitting: Nurse Practitioner

## 2021-12-07 ENCOUNTER — Encounter: Payer: Self-pay | Admitting: Nurse Practitioner

## 2021-12-07 DIAGNOSIS — J209 Acute bronchitis, unspecified: Secondary | ICD-10-CM | POA: Diagnosis not present

## 2021-12-07 NOTE — Telephone Encounter (Signed)
Called patient. Someone answered the phone but the call got disconnected twice. Will attempt to call back later today.

## 2021-12-07 NOTE — Telephone Encounter (Signed)
Called patient's work number again. The person who answered stated that he was not there today. Attempted the mobile number again but no one answered.

## 2021-12-28 ENCOUNTER — Other Ambulatory Visit: Payer: Self-pay | Admitting: Family Medicine

## 2021-12-28 ENCOUNTER — Encounter: Payer: Self-pay | Admitting: Family Medicine

## 2021-12-28 MED ORDER — OZEMPIC (0.25 OR 0.5 MG/DOSE) 2 MG/3ML ~~LOC~~ SOPN
0.2500 mg | PEN_INJECTOR | SUBCUTANEOUS | 0 refills | Status: DC
  Filled 2021-12-28: qty 3, 28d supply, fill #0
  Filled 2022-02-04: qty 3, 42d supply, fill #0

## 2021-12-29 ENCOUNTER — Other Ambulatory Visit (HOSPITAL_COMMUNITY): Payer: Self-pay

## 2021-12-30 ENCOUNTER — Telehealth: Payer: Self-pay | Admitting: *Deleted

## 2021-12-30 NOTE — Telephone Encounter (Signed)
PA for Ozempic denied by insurance.  Denial reason per insurance- GLP-1 can only be prescribed as a step therapy and in conjunction with a trial and failure or current use of metformin, a metformin combination product and a covered  sulfonylurea or sulonylurea combination product. - not covered as first line treatment for DM

## 2022-01-04 NOTE — Telephone Encounter (Signed)
Left message to return call 

## 2022-01-04 NOTE — Telephone Encounter (Signed)
Pt returned call. Pt would like to try Metformin. Please advise. Thank you Elvina Sidle

## 2022-01-04 NOTE — Telephone Encounter (Signed)
Coral Spikes, DO     Please inform patient.

## 2022-01-05 ENCOUNTER — Other Ambulatory Visit (HOSPITAL_COMMUNITY): Payer: Self-pay

## 2022-01-05 ENCOUNTER — Other Ambulatory Visit: Payer: Self-pay | Admitting: Family Medicine

## 2022-01-05 MED ORDER — METFORMIN HCL 500 MG PO TABS
500.0000 mg | ORAL_TABLET | Freq: Two times a day (BID) | ORAL | 3 refills | Status: DC
  Filled 2022-01-05: qty 180, 90d supply, fill #0

## 2022-01-05 MED ORDER — METFORMIN HCL 500 MG PO TABS
500.0000 mg | ORAL_TABLET | Freq: Two times a day (BID) | ORAL | 3 refills | Status: DC
  Filled 2022-01-05: qty 180, 90d supply, fill #0
  Filled 2022-04-29: qty 180, 90d supply, fill #1
  Filled 2022-07-07 – 2022-07-22 (×2): qty 180, 90d supply, fill #2
  Filled 2022-09-22 – 2022-10-27 (×2): qty 180, 90d supply, fill #3

## 2022-01-06 ENCOUNTER — Other Ambulatory Visit (HOSPITAL_COMMUNITY): Payer: Self-pay

## 2022-01-06 NOTE — Telephone Encounter (Signed)
Cook, Jayce G, DO   ? ?Rx sent.   ? ?

## 2022-01-25 ENCOUNTER — Other Ambulatory Visit (HOSPITAL_COMMUNITY): Payer: Self-pay

## 2022-01-25 MED ORDER — ALBUTEROL SULFATE HFA 108 (90 BASE) MCG/ACT IN AERS
INHALATION_SPRAY | RESPIRATORY_TRACT | 0 refills | Status: DC
  Filled 2022-01-25: qty 6.7, 16d supply, fill #0

## 2022-02-04 ENCOUNTER — Other Ambulatory Visit: Payer: Self-pay | Admitting: Family Medicine

## 2022-02-04 ENCOUNTER — Other Ambulatory Visit (HOSPITAL_COMMUNITY): Payer: Self-pay

## 2022-02-04 DIAGNOSIS — K219 Gastro-esophageal reflux disease without esophagitis: Secondary | ICD-10-CM

## 2022-02-04 MED ORDER — ESOMEPRAZOLE MAGNESIUM 40 MG PO CPDR
40.0000 mg | DELAYED_RELEASE_CAPSULE | Freq: Two times a day (BID) | ORAL | 1 refills | Status: DC
Start: 2022-02-04 — End: 2022-09-22
  Filled 2022-02-04: qty 180, 90d supply, fill #0
  Filled 2022-05-11: qty 180, 90d supply, fill #1

## 2022-02-05 ENCOUNTER — Other Ambulatory Visit (HOSPITAL_COMMUNITY): Payer: Self-pay

## 2022-02-08 ENCOUNTER — Other Ambulatory Visit (HOSPITAL_COMMUNITY): Payer: Self-pay

## 2022-02-17 ENCOUNTER — Other Ambulatory Visit (HOSPITAL_COMMUNITY): Payer: Self-pay

## 2022-02-23 ENCOUNTER — Other Ambulatory Visit: Payer: Self-pay | Admitting: Family Medicine

## 2022-02-23 ENCOUNTER — Other Ambulatory Visit (HOSPITAL_COMMUNITY): Payer: Self-pay

## 2022-02-23 MED ORDER — ALBUTEROL SULFATE HFA 108 (90 BASE) MCG/ACT IN AERS
INHALATION_SPRAY | RESPIRATORY_TRACT | 0 refills | Status: DC
  Filled 2022-02-23: qty 6.7, 17d supply, fill #0

## 2022-02-23 MED ORDER — BUDESONIDE-FORMOTEROL FUMARATE 80-4.5 MCG/ACT IN AERO
2.0000 | INHALATION_SPRAY | Freq: Two times a day (BID) | RESPIRATORY_TRACT | 4 refills | Status: DC
  Filled 2022-02-23: qty 10.2, 30d supply, fill #0
  Filled 2022-04-05: qty 10.2, 30d supply, fill #1
  Filled 2022-04-29: qty 10.2, 30d supply, fill #2
  Filled 2022-07-07: qty 10.2, 30d supply, fill #3
  Filled 2022-08-06: qty 10.2, 30d supply, fill #4

## 2022-03-05 ENCOUNTER — Other Ambulatory Visit: Payer: Self-pay | Admitting: Family Medicine

## 2022-03-08 ENCOUNTER — Other Ambulatory Visit (HOSPITAL_COMMUNITY): Payer: Self-pay

## 2022-03-08 MED ORDER — ALBUTEROL SULFATE HFA 108 (90 BASE) MCG/ACT IN AERS
INHALATION_SPRAY | RESPIRATORY_TRACT | 0 refills | Status: DC
  Filled 2022-03-08: qty 6.7, 17d supply, fill #0

## 2022-03-16 ENCOUNTER — Other Ambulatory Visit: Payer: Self-pay | Admitting: Family Medicine

## 2022-03-17 MED ORDER — OZEMPIC (0.25 OR 0.5 MG/DOSE) 2 MG/3ML ~~LOC~~ SOPN
0.5000 mg | PEN_INJECTOR | SUBCUTANEOUS | 0 refills | Status: DC
  Filled 2022-03-17: qty 3, 28d supply, fill #0

## 2022-03-18 ENCOUNTER — Other Ambulatory Visit (HOSPITAL_COMMUNITY): Payer: Self-pay

## 2022-04-05 ENCOUNTER — Other Ambulatory Visit (HOSPITAL_COMMUNITY): Payer: Self-pay

## 2022-04-05 ENCOUNTER — Other Ambulatory Visit: Payer: Self-pay | Admitting: Family Medicine

## 2022-04-05 DIAGNOSIS — E119 Type 2 diabetes mellitus without complications: Secondary | ICD-10-CM

## 2022-04-06 ENCOUNTER — Other Ambulatory Visit (HOSPITAL_COMMUNITY): Payer: Self-pay

## 2022-04-06 MED ORDER — OZEMPIC (0.25 OR 0.5 MG/DOSE) 2 MG/3ML ~~LOC~~ SOPN
0.5000 mg | PEN_INJECTOR | SUBCUTANEOUS | 0 refills | Status: DC
  Filled 2022-04-06 – 2022-04-10 (×2): qty 3, 28d supply, fill #0

## 2022-04-06 MED ORDER — ALBUTEROL SULFATE HFA 108 (90 BASE) MCG/ACT IN AERS
2.0000 | INHALATION_SPRAY | RESPIRATORY_TRACT | 0 refills | Status: DC | PRN
  Filled 2022-04-06: qty 6.7, 17d supply, fill #0

## 2022-04-07 ENCOUNTER — Other Ambulatory Visit (HOSPITAL_COMMUNITY): Payer: Self-pay

## 2022-04-08 ENCOUNTER — Other Ambulatory Visit (HOSPITAL_COMMUNITY): Payer: Self-pay

## 2022-04-10 ENCOUNTER — Other Ambulatory Visit (HOSPITAL_COMMUNITY): Payer: Self-pay

## 2022-04-12 ENCOUNTER — Other Ambulatory Visit (HOSPITAL_COMMUNITY): Payer: Self-pay

## 2022-04-12 ENCOUNTER — Encounter: Payer: Self-pay | Admitting: Family Medicine

## 2022-04-13 ENCOUNTER — Other Ambulatory Visit (HOSPITAL_COMMUNITY): Payer: Self-pay

## 2022-04-13 ENCOUNTER — Other Ambulatory Visit: Payer: Self-pay | Admitting: Family Medicine

## 2022-04-13 MED ORDER — SEMAGLUTIDE (1 MG/DOSE) 4 MG/3ML ~~LOC~~ SOPN
1.0000 mg | PEN_INJECTOR | SUBCUTANEOUS | 1 refills | Status: DC
  Filled 2022-04-13 – 2022-05-03 (×3): qty 3, 28d supply, fill #0
  Filled 2022-05-27: qty 3, 28d supply, fill #1

## 2022-04-27 DIAGNOSIS — E119 Type 2 diabetes mellitus without complications: Secondary | ICD-10-CM | POA: Diagnosis not present

## 2022-04-28 LAB — HEMOGLOBIN A1C
Est. average glucose Bld gHb Est-mCnc: 140 mg/dL
Hgb A1c MFr Bld: 6.5 % — ABNORMAL HIGH (ref 4.8–5.6)

## 2022-04-29 ENCOUNTER — Encounter: Payer: Self-pay | Admitting: *Deleted

## 2022-04-29 ENCOUNTER — Other Ambulatory Visit (HOSPITAL_COMMUNITY): Payer: Self-pay

## 2022-05-03 ENCOUNTER — Other Ambulatory Visit (HOSPITAL_COMMUNITY): Payer: Self-pay

## 2022-05-06 ENCOUNTER — Encounter: Payer: Self-pay | Admitting: Family Medicine

## 2022-05-11 ENCOUNTER — Other Ambulatory Visit (HOSPITAL_COMMUNITY): Payer: Self-pay

## 2022-05-11 ENCOUNTER — Other Ambulatory Visit: Payer: Self-pay | Admitting: Family Medicine

## 2022-05-11 DIAGNOSIS — I10 Essential (primary) hypertension: Secondary | ICD-10-CM

## 2022-05-11 MED ORDER — AMLODIPINE BESYLATE 5 MG PO TABS
5.0000 mg | ORAL_TABLET | Freq: Every day | ORAL | 1 refills | Status: DC
  Filled 2022-05-11: qty 90, 90d supply, fill #0
  Filled 2022-08-06: qty 90, 90d supply, fill #1

## 2022-05-11 MED ORDER — LOSARTAN POTASSIUM 100 MG PO TABS
100.0000 mg | ORAL_TABLET | Freq: Every day | ORAL | 1 refills | Status: DC
  Filled 2022-05-11: qty 90, 90d supply, fill #0
  Filled 2022-08-06: qty 90, 90d supply, fill #1

## 2022-05-18 ENCOUNTER — Ambulatory Visit: Payer: 59 | Admitting: Family Medicine

## 2022-05-18 ENCOUNTER — Ambulatory Visit: Payer: 59 | Admitting: Nurse Practitioner

## 2022-05-18 ENCOUNTER — Encounter: Payer: Self-pay | Admitting: Nurse Practitioner

## 2022-05-18 ENCOUNTER — Other Ambulatory Visit: Payer: Self-pay

## 2022-05-18 VITALS — BP 128/70 | HR 80 | Temp 98.0°F | Wt 270.4 lb

## 2022-05-18 VITALS — BP 118/74 | HR 71 | Ht 71.0 in | Wt 268.4 lb

## 2022-05-18 DIAGNOSIS — J3089 Other allergic rhinitis: Secondary | ICD-10-CM

## 2022-05-18 DIAGNOSIS — I10 Essential (primary) hypertension: Secondary | ICD-10-CM

## 2022-05-18 DIAGNOSIS — E7800 Pure hypercholesterolemia, unspecified: Secondary | ICD-10-CM

## 2022-05-18 DIAGNOSIS — J4540 Moderate persistent asthma, uncomplicated: Secondary | ICD-10-CM

## 2022-05-18 DIAGNOSIS — E119 Type 2 diabetes mellitus without complications: Secondary | ICD-10-CM

## 2022-05-18 DIAGNOSIS — K219 Gastro-esophageal reflux disease without esophagitis: Secondary | ICD-10-CM | POA: Diagnosis not present

## 2022-05-18 MED ORDER — MONTELUKAST SODIUM 10 MG PO TABS
10.0000 mg | ORAL_TABLET | Freq: Every day | ORAL | 5 refills | Status: DC
  Filled 2022-05-18: qty 30, 30d supply, fill #0
  Filled 2022-06-11: qty 30, 30d supply, fill #1
  Filled 2022-07-07: qty 30, 30d supply, fill #2
  Filled 2022-08-06: qty 30, 30d supply, fill #3
  Filled 2022-09-22: qty 30, 30d supply, fill #4
  Filled 2022-10-27: qty 30, 30d supply, fill #5

## 2022-05-18 NOTE — Assessment & Plan Note (Signed)
Possible postnasal drainage component to symptoms. He will continue intranasal steroid and daily antihistamine. We are adding on singulair for allergies/asthma.

## 2022-05-18 NOTE — Assessment & Plan Note (Signed)
Tolerating Crestor.  Continue. 

## 2022-05-18 NOTE — Assessment & Plan Note (Signed)
BP well-controlled on losartan and amlodipine.  Continue.

## 2022-05-18 NOTE — Progress Notes (Signed)
Subjective:  Patient ID: Christopher Bowers, male    DOB: [birth year]  Age: 56 y.o. MRN: [id number]  CC: Chief Complaint  Patient presents with   Follow-up    medications    HPI:  56 year old male with hypertension, type 2 diabetes, fatty liver disease, hyperlipidemia presents for follow-up.  Patient is overall doing well.  Last A1c was at the end of November and was 6.5.  He is on metformin and is also on Ozempic.  He states that he has had some nausea associated with the Ozempic but otherwise is doing well.  He feels like he has been losing weight and he states that he has more endurance.  Patient's hypertension is well-controlled on losartan and amlodipine.  Lipids have been well-controlled on Crestor.  Tolerating.  No reported side effects.  Patient follows with pulmonology regarding asthmatic bronchitis.  He states that he is doing well at this time.  Patient Active Problem List   Diagnosis Date Noted   Type 2 diabetes mellitus ([place]) 11/16/2021   Bilateral lower extremity edema 11/13/2021   Allergic rhinitis 09/28/2021   Hyperlipidemia 05/11/2021   GERD (gastroesophageal reflux disease) 05/11/2021   Fatty liver 02/03/2021   Major depressive disorder with single episode, in remission ([place]) 01/08/2020   Asthmatic bronchitis, moderate persistent, uncomplicated 07/37/1062   Generalized anxiety disorder 02/05/2017   Obstructive sleep apnea 07/21/2016   Obesity 07/08/2012   Essential hypertension 07/08/2012    Social Hx   Social History   Socioeconomic History   Marital status: Married    Spouse name: Not on file   Number of children: 1   Years of education: college   Highest education level: Not on file  Occupational History   Occupation: Scientist, clinical (histocompatibility and immunogenetics): [employer]    Comment: truck  Tobacco Use   Smoking status: Never   Smokeless tobacco: Current    Types: Snuff    Last attempt to quit: 02/26/2015   Tobacco comments:    "occasional snuff"  Vaping Use    Vaping Use: Never used  Substance and Sexual Activity   Alcohol use: Yes    Alcohol/week: 5.0 standard drinks of alcohol    Types: 5 Shots of liquor per week    Comment: 5-6 liquor drinks per day   Drug use: No   Sexual activity: Not on file  Other Topics Concern   Not on file  Social History Narrative   Patient drinks about 3 cups of caffeine daily.   Patient is right handed.   Social Determinants of Health   Financial Resource Strain: Not on file  Food Insecurity: Not on file  Transportation Needs: Not on file  Physical Activity: Not on file  Stress: Not on file  Social Connections: Not on file    Review of Systems Per HPI  Objective:  BP 128/70   Pulse 80   Temp 98 F (36.7 C) (Oral)   Wt 270 lb 6.4 oz (122.7 kg)   SpO2 97%   BMI 37.71 kg/m      05/18/2022    8:31 AM 11/13/2021    4:41 PM 11/13/2021    1:58 PM  BP/Weight  Systolic BP 694 854 627  Diastolic BP 70 74 65  Wt. (Lbs) 270.4 265.8 266.8  BMI 37.71 kg/m2 37.07 kg/m2 37.21 kg/m2    Physical Exam Vitals and nursing note reviewed.  Constitutional:      General: He is not in acute distress.  Appearance: Normal appearance. He is obese.  HENT:     Head: Normocephalic and atraumatic.  Cardiovascular:     Rate and Rhythm: Normal rate and regular rhythm.  Pulmonary:     Effort: Pulmonary effort is normal.     Breath sounds: Normal breath sounds. No wheezing, rhonchi or rales.  Neurological:     Mental Status: He is alert.  Psychiatric:        Mood and Affect: Mood normal.        Behavior: Behavior normal.     Lab Results  Component Value Date   WBC 6.7 11/11/2021   HGB 14.1 11/11/2021   HCT 42.3 11/11/2021   PLT 144 (L) 11/11/2021   GLUCOSE 157 (H) 11/11/2021   CHOL 155 11/11/2021   TRIG 69 11/11/2021   HDL 47 11/11/2021   LDLCALC 94 11/11/2021   ALT 77 (H) 11/11/2021   AST 80 (H) 11/11/2021   NA 140 11/11/2021   K 4.8 11/11/2021   CL 103 11/11/2021   CREATININE 0.92 11/11/2021    BUN 12 11/11/2021   CO2 22 11/11/2021   TSH 6.190 (H) 08/02/2013   HGBA1C 6.5 (H) 04/27/2022     Assessment & Plan:   Problem List Items Addressed This Visit       Cardiovascular and Mediastinum   Essential hypertension    BP well-controlled on losartan and amlodipine.  Continue.        Respiratory   Asthmatic bronchitis, moderate persistent, uncomplicated    Stable currently.        Endocrine   Type 2 diabetes mellitus ([place]) - Primary    Diabetes is at goal.  Advised to get a yearly eye exam. Planning for urine ACR at next visit.  Continue current dosing of Ozempic and metformin.        Other   Hyperlipidemia    Tolerating Crestor.  Continue.      Follow-up:  Return in about 6 months (around 11/17/2022).  [place]

## 2022-05-18 NOTE — Patient Instructions (Addendum)
Continue Albuterol inhaler 2 puffs or 3 mL neb every 6 hours as needed for shortness of breath or wheezing. Notify if symptoms persist despite rescue inhaler/neb use. Increase Symbicort 2 puffs to Twice daily. Brush tongue and rinse mouth afterwards Continue Nexium 40 mg daily Continue flonase nasal spray 2 sprays each nostril Continue Allegra daily for allergies  Continue mucinex 600 mg Twice daily as needed for chest congestion/cough  Singulair (montelukast) 1 tab At bedtime for allergies/asthma    Follow up in 6 weeks with Dr. Melvyn Novas or Alanson Aly for medication follow up. If symptoms do not improve or worsen, please contact office for sooner follow up or seek emergency care.

## 2022-05-18 NOTE — Assessment & Plan Note (Signed)
Diabetes is at goal.  Advised to get a yearly eye exam. Planning for urine ACR at next visit.  Continue current dosing of Ozempic and metformin.

## 2022-05-18 NOTE — Patient Instructions (Signed)
Continue your medications.  Labs before your next visit in 6 months.  Take care  Dr. Lacinda Axon

## 2022-05-18 NOTE — Progress Notes (Signed)
$'@Patient'K$  ID: Christopher Bowers, male    DOB: Feb 27, 1966, 56 y.o.   MRN: [id number]  Chief Complaint  Patient presents with   Follow-up    Pt f/u he is feeling well, breathing is fine. 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  He is a patient of Dr. Gustavus Bryant and last seen in office on 11/13/2021 by [facility] NP.  Past medical history significant for hypertension, GERD, fatty liver, obesity, GAD, MDD, prediabetes, HLD.  TEST/EVENTS:  08/18/2020 PFTs: FVC 80, FEV1 90, ratio 87, TLC 98, DLCOunc 94.  No significant bronchodilator response; did have some mid flow reversibility 09/23/2021 CXR 1 view: Both lungs were clear.  There was no evidence of cardiopulmonary disease.  09/28/2021: OV with Sumedha Munnerlyn NP for increased shortness of breath, wheezing and cough.  He also had some significant nasal congestion and drainage.  His symptoms started about a week ago.  He was seen by his PCP last Sunday via virtual visit and was prescribed an antibiotic and prednisone.  He started feeling worse last Wednesday and went to the ED.  Was treated with steroid injection, nebulizer treatment and discharged with prednisone burst for suspected asthma exacerbation.  Saw PCP again on Friday for hospital follow-up.  Reported that he was feeling relatively the same.  He was prescribed Levaquin course and extended prednisone taper.  Today, he reports feeling better.  Still has a slight increase in his DOE from his baseline and his cough has not entirely resolved; however he has had significant improvement in his symptoms.  Feels like Levaquin has made a big difference.  He was coughing up a little bit of blood and having some bloody nasal discharge last week but this has resolved.  He has not had any fevers, calf pain or swelling, chest pain.  He has been using albuterol neb twice a day.  He did restart using his  Symbicort twice daily.  He does report that he does not consistently use this every day.  Does have a history of significant allergy symptoms and takes Allegra. Seemed exacerbation was resolving with FeNO nl. Advised to complete levaquin and prednisone as previously prescribed. Discussed cough control and advised him on importance of compliance with maintenance inhaler therapy. Possible given his bloody nasal drainage that his hemoptysis was related to this; advised he use saline nasal gel at bedtime and back off on flonase if symptoms return.  11/13/2021: OV with Revel Stellmach NP for follow-up.  He is doing significantly better.  Cough has entirely resolved and no more episodes of hemoptysis.  He was able to complete his antibiotics and steroids without any difficulties.  Previous x-rays were all clear.  He does have some occasional swelling in his lower extremities, which usually occurs when he is out in the heat or sitting for a long time.  His wife is worried about this so he went and saw his PCP earlier today regarding this, BNP was drawn but has not resulted yet.  He has no significant cardiac history.  Does take amlodipine for hypertension.  He continues on Symbicort twice daily.  Has not had to use his rescue inhaler.  Does still take Mucinex every now and then if he feels like he is getting a little congested.  Takes Allegra daily for allergies.  05/18/2022: Today - follow up Patient presents today for follow-up.  He  has been stable since he was here last.  He has not had any flares requiring any more antibiotics or steroids.  No hospitalizations.  He does still have daily symptoms of chest congestion and dyspnea.  Has an occasional cough but describes it more as needing to clear his throat.  He does take Nexium and Mucinex at night, which seem to help with this.  He has been using his albuterol twice daily, once in the morning and once in the evening.  He is only been using his Symbicort once a day.  Denies any  fevers, chills, night sweats, orthopnea, PND, lower extremity swelling.  He takes Environmental manager for allergies which he feels are stable. Working on weight loss with Ozempic.   Allergies  Allergen Reactions   Lisinopril    Augmentin [Amoxicillin-Pot Clavulanate] Rash    Immunization History  Administered Date(s) Administered   Influenza Split 03/19/2013, 02/28/2014, 02/20/2018   Influenza,inj,Quad PF,6+ Mos 02/20/2018, 06/19/2020   Influenza-Unspecified 03/10/2015, 05/05/2016, 04/03/2019, 05/07/2021, 05/05/2022   Moderna Sars-Covid-2 Vaccination 02/28/2020, 03/17/2020   Pneumococcal Polysaccharide-23 05/09/2013    Past Medical History:  Diagnosis Date   Carpal tunnel syndrome of right wrist    GERD (gastroesophageal reflux disease)    History of acute respiratory failure    Feb 2014  w/  CAP and ARDS  ---  resolved   History of exercise stress test    01-07-2010   -- negative for ischemia, no chest pain,  hypertensive response to exercise   History of gastritis    History of hiatal hernia    Hypertension    OSA (obstructive sleep apnea)    per pt study done 2013 OSA intolerant cpap  ,  pt uses oral appliance   Seizure ([place]) 11/27/2020    Tobacco History: Social History   Tobacco Use  Smoking Status Never  Smokeless Tobacco Current   Types: Snuff   Last attempt to quit: 02/26/2015  Tobacco Comments   "occasional snuff"   Ready to quit: Not Answered Counseling given: Not Answered Tobacco comments: "occasional snuff"   Outpatient Medications Prior to Visit  Medication Sig Dispense Refill   albuterol (PROVENTIL) (2.5 MG/3ML) 0.083% nebulizer solution Take 3 mLs (2.5 mg total) by nebulization every 6 (six) hours as needed for wheezing or shortness of breath. 75 mL 4   albuterol (VENTOLIN HFA) 108 (90 Base) MCG/ACT inhaler Inhale 2 puffs into the lungs every 4 (four) hours as needed for wheezing or shortness of breath 18 g 2   albuterol (VENTOLIN HFA) 108 (90 Base)  MCG/ACT inhaler Inhale 2 puffs into the lungs every 4 (four) hours as needed for wheezing or shortness of breath. 6.7 g 0   amLODipine (NORVASC) 5 MG tablet Take 1 tablet (5 mg total) by mouth daily. 90 tablet 1   budesonide-formoterol (SYMBICORT) 80-4.5 MCG/ACT inhaler Inhale 2 puffs into the lungs in the morning and at bedtime. 10.2 g 4   Cholecalciferol (VITAMIN D3) 5000 UNITS TABS Take 5,000 Units by mouth daily. (when he remembers)     esomeprazole (NEXIUM) 40 MG capsule Take 1 capsule (40 mg total) by mouth 2 (two) times daily before meals 180 capsule 1   fluticasone (FLONASE) 50 MCG/ACT nasal spray Place 2 sprays into both nostrils daily. 16 g 1   ibuprofen (ADVIL) 200 MG tablet Take 200 mg by mouth every 6 (six) hours as needed.     losartan (COZAAR) 100 MG tablet Take 1 tablet (100 mg total) by mouth  daily. 90 tablet 1   metFORMIN (GLUCOPHAGE) 500 MG tablet Take 1 tablet (500 mg total) by mouth 2 (two) times daily with a meal. 180 tablet 3   rosuvastatin (CRESTOR) 10 MG tablet Take 1 tablet (10 mg total) by mouth daily. 90 tablet 3   Semaglutide, 1 MG/DOSE, 4 MG/3ML SOPN Inject 1 mg as directed once a week. 3 mL 1   No facility-administered medications prior to visit.     Review of Systems:   Constitutional: No weight loss or gain, night sweats, fevers, chills, fatigue, or lassitude. HEENT: No headaches, difficulty swallowing, tooth/dental problems, or sore throat. No sneezing, itching, ear ache, nasal congestion, post nasal drip CV:  + Intermittent swelling in lower extremities (chronic). No chest pain, orthopnea, PND, anasarca, dizziness, palpitations, syncope Resp: +shortness of breath with exertion; chest congestion in AM; occasional cough/throat clearing. No excess mucus or change in color of mucus.  No wheeze.  No hemoptysis.  No chest wall deformity GI:  +heartburn, indigestion (improves with Nexium). No abdominal pain, nausea, vomiting, diarrhea, change in bowel habits, loss of  appetite, bloody stools.  Skin: No rash, lesions, ulcerations MSK:  No joint pain or swelling.  No decreased range of motion.  No back pain. Neuro: No dizziness or lightheadedness.  Psych: No depression or anxiety. Mood stable.     Physical Exam:  BP 118/74   Pulse 71   Ht '5\' 11"'$  (1.803 m)   Wt 268 lb 6.4 oz (121.7 kg)   SpO2 96%   BMI 37.43 kg/m   GEN: Pleasant, interactive, well-appearing; obese; in no acute distress. HEENT:  Normocephalic and atraumatic.  PERRLA. Sclera white. Nasal turbinates pink, moist and patent bilaterally.  No rhinorrhea present. Oropharynx pink and moist, without exudate or edema. No lesions, ulcerations  NECK:  Supple w/ fair ROM. No JVD present. Normal carotid impulses w/o bruits. Thyroid symmetrical with no goiter or nodules palpated. No lymphadenopathy.   CV: RRR, no m/r/g, no peripheral edema. Pulses intact, +2 bilaterally. No cyanosis, pallor or clubbing. PULMONARY:  Unlabored, regular breathing. Clear bilaterally A&P w/o wheezes/rales/rhonchi. No accessory muscle use. No dullness to percussion. GI: BS present and normoactive. Soft, non-tender to palpation. No organomegaly or masses detected.  MSK: No erythema, warmth or tenderness. Cap refil <2 sec all extrem. No deformities or joint swelling noted.  Neuro: A/Ox3. No focal deficits noted.   Skin: Warm, no lesions or rashe Psych: Normal affect and behavior. Judgement and thought content appropriate.     Lab Results:  CBC    Component Value Date/Time   WBC 6.7 11/11/2021 0835   WBC 10.9 (H) 03/22/2017 1710   RBC 4.48 11/11/2021 0835   RBC 5.20 03/22/2017 1710   HGB 14.1 11/11/2021 0835   HCT 42.3 11/11/2021 0835   PLT 144 (L) 11/11/2021 0835   MCV 94 11/11/2021 0835   MCH 31.5 11/11/2021 0835   MCH 31.0 09/26/2014 0740   MCHC 33.3 11/11/2021 0835   MCHC 33.6 03/22/2017 1710   RDW 13.1 11/11/2021 0835   LYMPHSABS 2.5 07/04/2020 0821   MONOABS 1.0 03/22/2017 1710   EOSABS 0.1 07/04/2020  0821   BASOSABS 0.1 07/04/2020 0821    BMET    Component Value Date/Time   NA 140 11/11/2021 0835   K 4.8 11/11/2021 0835   CL 103 11/11/2021 0835   CO2 22 11/11/2021 0835   GLUCOSE 157 (H) 11/11/2021 0835   GLUCOSE 134 (H) 05/01/2015 1146   BUN 12 11/11/2021 0835  CREATININE 0.92 11/11/2021 0835   CREATININE 1.16 03/14/2013 1236   CALCIUM 9.4 11/11/2021 0835   GFRNONAA 72 07/04/2020 0821   GFRAA 83 07/04/2020 0821    BNP    Component Value Date/Time   BNP 16.7 11/13/2021 1447   BNP 17.0 09/26/2014 0740     Imaging:  No results found.       Latest Ref Rng & Units 08/18/2020    3:44 PM  PFT Results  FVC-Pre L 4.04   FVC-Predicted Pre % 80   FVC-Post L 4.06   FVC-Predicted Post % 80   Pre FEV1/FVC % % 85   Post FEV1/FCV % % 87   FEV1-Pre L 3.44   FEV1-Predicted Pre % 88   FEV1-Post L 3.52   DLCO uncorrected ml/min/mmHg 27.45   DLCO UNC% % 94   DLVA Predicted % 112   TLC L 6.99   TLC % Predicted % 98   RV % Predicted % 129     Lab Results  Component Value Date   NITRICOXIDE 108 03/22/2017        Assessment & Plan:   Asthmatic bronchitis, moderate persistent, uncomplicated Poorly controlled without acute exacerbation. He is still experiencing daily symptoms of dyspnea and chest congestion, likely related to decreased use of maintenance therapy. Cough/throat clearing seems to be more so related to postnasal drainage/GERD. Overusing SABA. Encouraged him to restart Symbicort twice a day and try to wean SABA use, if tolerated. We will add on singulair for trigger prevention; risks/benefits reviewed. Asthma action plan in place.   Patient Instructions  Continue Albuterol inhaler 2 puffs or 3 mL neb every 6 hours as needed for shortness of breath or wheezing. Notify if symptoms persist despite rescue inhaler/neb use. Increase Symbicort 2 puffs to Twice daily. Brush tongue and rinse mouth afterwards Continue Nexium 40 mg daily Continue flonase nasal  spray 2 sprays each nostril Continue Allegra daily for allergies  Continue mucinex 600 mg Twice daily as needed for chest congestion/cough  Singulair (montelukast) 1 tab At bedtime for allergies/asthma    Follow up in 6 weeks with Dr. Melvyn Novas or Alanson Aly for medication follow up. If symptoms do not improve or worsen, please contact office for sooner follow up or seek emergency care.   Allergic rhinitis Possible postnasal drainage component to symptoms. He will continue intranasal steroid and daily antihistamine. We are adding on singulair for allergies/asthma.   GERD (gastroesophageal reflux disease) Feels as though this is well controlled with Nexium. May need to consider change to twice daily PPI dosing or addition of pepcid if symptoms do not improve with above measures.     I spent 35 minutes of dedicated to the care of this patient on the date of this encounter to include pre-visit review of records, face-to-face time with the patient discussing conditions above, post visit ordering of testing, clinical documentation with the electronic health record, making appropriate referrals as documented, and communicating necessary findings to members of the patients care team.  Clayton Bibles, NP 05/18/2022  Pt aware and understands NP's role.

## 2022-05-18 NOTE — Assessment & Plan Note (Signed)
Feels as though this is well controlled with Nexium. May need to consider change to twice daily PPI dosing or addition of pepcid if symptoms do not improve with above measures.

## 2022-05-18 NOTE — Assessment & Plan Note (Signed)
Poorly controlled without acute exacerbation. He is still experiencing daily symptoms of dyspnea and chest congestion, likely related to decreased use of maintenance therapy. Cough/throat clearing seems to be more so related to postnasal drainage/GERD. Overusing SABA. Encouraged him to restart Symbicort twice a day and try to wean SABA use, if tolerated. We will add on singulair for trigger prevention; risks/benefits reviewed. Asthma action plan in place.   Patient Instructions  Continue Albuterol inhaler 2 puffs or 3 mL neb every 6 hours as needed for shortness of breath or wheezing. Notify if symptoms persist despite rescue inhaler/neb use. Increase Symbicort 2 puffs to Twice daily. Brush tongue and rinse mouth afterwards Continue Nexium 40 mg daily Continue flonase nasal spray 2 sprays each nostril Continue Allegra daily for allergies  Continue mucinex 600 mg Twice daily as needed for chest congestion/cough  Singulair (montelukast) 1 tab At bedtime for allergies/asthma    Follow up in 6 weeks with Dr. Melvyn Novas or Alanson Aly for medication follow up. If symptoms do not improve or worsen, please contact office for sooner follow up or seek emergency care.

## 2022-05-18 NOTE — Assessment & Plan Note (Signed)
Stable currently

## 2022-05-27 ENCOUNTER — Other Ambulatory Visit: Payer: Self-pay

## 2022-06-08 ENCOUNTER — Other Ambulatory Visit: Payer: Self-pay | Admitting: Family Medicine

## 2022-06-08 ENCOUNTER — Other Ambulatory Visit (HOSPITAL_COMMUNITY): Payer: Self-pay

## 2022-06-08 MED ORDER — OZEMPIC (1 MG/DOSE) 4 MG/3ML ~~LOC~~ SOPN
1.0000 mg | PEN_INJECTOR | SUBCUTANEOUS | 1 refills | Status: DC
  Filled 2022-06-08 – 2022-06-21 (×3): qty 3, 28d supply, fill #0
  Filled 2022-07-22: qty 3, 28d supply, fill #1

## 2022-06-09 ENCOUNTER — Other Ambulatory Visit (HOSPITAL_COMMUNITY): Payer: Self-pay

## 2022-06-11 ENCOUNTER — Other Ambulatory Visit: Payer: Self-pay | Admitting: Family Medicine

## 2022-06-11 ENCOUNTER — Other Ambulatory Visit: Payer: Self-pay

## 2022-06-11 ENCOUNTER — Other Ambulatory Visit (HOSPITAL_COMMUNITY): Payer: Self-pay

## 2022-06-11 MED ORDER — MOLNUPIRAVIR EUA 200MG CAPSULE: 4.0000 | ORAL_CAPSULE | Freq: Two times a day (BID) | ORAL | 0 refills | Status: AC

## 2022-06-11 MED ORDER — PREDNISONE 10 MG PO TABS: ORAL_TABLET | ORAL | 0 refills | Status: AC

## 2022-06-11 NOTE — Telephone Encounter (Signed)
No need for abx at this point as symptoms are viral in nature. Possible he may be having an associated asthma exacerbation with increased SOB and wheezing. Ok to send prednisone taper (10 mg tablets) 4 tabs for 2 days, then 3 tabs for 2 days, 2 tabs for 2 days, then 1 tab for 2 days, then stop. No refills. Take in AM with food.  Was he already put on an antiviral? If not, please send molnupiravir 4 capsules Twice daily for 5 days. Take with food.  Can use OTC mucinex, decongestant, saline rinses and flonase nasal spray for sinus symptoms. Tylenol OTC for pain/fevers. Asthma action plan: START nebs up to four times a day for worsening shortness of breath, wheezing and cough. If you symptoms do not improve in 24-48 hours, contact us for steroid course. If symptoms rapidly worsen, he has trouble talking or extreme difficulty breathing, or he is not getting relief from your nebulizer/rescue, go to the emergency department.

## 2022-06-21 ENCOUNTER — Other Ambulatory Visit: Payer: Self-pay

## 2022-06-21 ENCOUNTER — Other Ambulatory Visit (HOSPITAL_COMMUNITY): Payer: Self-pay

## 2022-07-07 ENCOUNTER — Ambulatory Visit: Payer: 59 | Admitting: Dermatology

## 2022-07-07 ENCOUNTER — Other Ambulatory Visit (HOSPITAL_COMMUNITY): Payer: Self-pay

## 2022-07-07 VITALS — BP 119/82 | HR 89

## 2022-07-07 DIAGNOSIS — L820 Inflamed seborrheic keratosis: Secondary | ICD-10-CM | POA: Diagnosis not present

## 2022-07-07 DIAGNOSIS — L918 Other hypertrophic disorders of the skin: Secondary | ICD-10-CM | POA: Diagnosis not present

## 2022-07-07 DIAGNOSIS — L814 Other melanin hyperpigmentation: Secondary | ICD-10-CM | POA: Diagnosis not present

## 2022-07-07 DIAGNOSIS — L578 Other skin changes due to chronic exposure to nonionizing radiation: Secondary | ICD-10-CM | POA: Diagnosis not present

## 2022-07-07 DIAGNOSIS — L821 Other seborrheic keratosis: Secondary | ICD-10-CM | POA: Diagnosis not present

## 2022-07-07 DIAGNOSIS — L720 Epidermal cyst: Secondary | ICD-10-CM | POA: Diagnosis not present

## 2022-07-07 NOTE — Progress Notes (Signed)
New Patient Visit  Subjective  Christopher Bowers is a 57 y.o. male who presents for the following: Growth (Back, months. Peeling.) and Skin tags (Neck, axilla, right flank, groin area. Bothersome, patient would like to have removed. ). Spots on back and side get irritated and sore.  The following portions of the chart were reviewed this encounter and updated as appropriate:       Review of Systems:  No other skin or systemic complaints except as noted in HPI or Assessment and Plan.  Objective  Well appearing patient in no apparent distress; mood and affect are within normal limits.  A focused examination was performed including face, trunk, extremities, groin. Relevant physical exam findings are noted in the Assessment and Plan.  R lat chest x 1, R spinal mid back x 2, R lower flank at waist x 1 (4) Erythematous stuck-on, waxy papule or plaque  L lat lower neck Firm subcutaneous nodule.    Assessment & Plan  Actinic Damage - chronic, secondary to cumulative UV radiation exposure/sun exposure over time - diffuse scaly erythematous macules with underlying dyspigmentation - Recommend daily broad spectrum sunscreen SPF 30+ to sun-exposed areas, reapply every 2 hours as needed.  - Recommend staying in the shade or wearing long sleeves, sun glasses (UVA+UVB protection) and wide brim hats (4-inch brim around the entire circumference of the hat). - Call for new or changing lesions.  Acrochordons (Skin Tags) - Removal desired by patient - Fleshy, skin-colored pedunculated papules - Benign appearing.  - Patient desires removal. Reviewed that this is not covered by insurance and they will be charged a cosmetic fee for removal. Patient signed non-covered consent.  - Prior to the procedure, reviewed the expected small wound. Also reviewed the risk of leaving a small scar and the small risk of infection.  PROCEDURE - The areas were prepped with isopropyl alcohol. A small amount of lidocaine 1%  with epinephrine was injected at the base of each lesion to achieve good local anesthesia. The skin tags were removed using a snip technique. Aluminum chloride was used for hemostasis. Petrolatum and a bandage were applied. The procedure was tolerated well. - Wound care was reviewed with the patient. They were advised to call with any concerns. Total number of treated acrochordons 10 (R neck x 3, L neck x 5, R med upper thigh/groin x 2)  Seborrheic Keratoses - Stuck-on, waxy, tan-brown papules and/or plaques, including back  - Benign-appearing - Discussed benign etiology and prognosis. - Observe - Call for any changes  Lentigines - Scattered tan macules - Due to sun exposure - Benign-appearing, observe - Recommend daily broad spectrum sunscreen SPF 30+ to sun-exposed areas, reapply every 2 hours as needed. - Call for any changes  Inflamed seborrheic keratosis (4) R lat chest x 1, R spinal mid back x 2, R lower flank at waist x 1  Symptomatic, irritating, patient would like treated.  Destruction of lesion - R lat chest x 1, R spinal mid back x 2, R lower flank at waist x 1  Destruction method: cryotherapy   Informed consent: discussed and consent obtained   Lesion destroyed using liquid nitrogen: Yes   Region frozen until ice ball extended beyond lesion: Yes   Outcome: patient tolerated procedure well with no complications   Post-procedure details: wound care instructions given   Additional details:  Prior to procedure, discussed risks of blister formation, small wound, skin dyspigmentation, or rare scar following cryotherapy. Recommend Vaseline ointment to treated  areas while healing.   Epidermal cyst L lat lower neck  Cyst with symptoms and/or recent change.  Discussed surgical excision to remove, including resulting scar and possible recurrence.  Patient will schedule for surgery. Pre-op information given.    Return for cyst excision - L lat lower neck.  IJamesetta Orleans,  CMA, am acting as scribe for Brendolyn Patty, MD .  Documentation: I have reviewed the above documentation for accuracy and completeness, and I agree with the above.  Brendolyn Patty MD

## 2022-07-07 NOTE — Patient Instructions (Addendum)
Cryotherapy Aftercare  Wash gently with soap and water everyday.   Apply Vaseline and Band-Aid daily until healed.     Seborrheic Keratosis  What causes seborrheic keratoses? Seborrheic keratoses are harmless, common skin growths that first appear during adult life.  As time goes by, more growths appear.  Some people may develop a large number of them.  Seborrheic keratoses appear on both covered and uncovered body parts.  They are not caused by sunlight.  The tendency to develop seborrheic keratoses can be inherited.  They vary in color from skin-colored to gray, brown, or even black.  They can be either smooth or have a rough, warty surface.   Seborrheic keratoses are superficial and look as if they were stuck on the skin.  Under the microscope this type of keratosis looks like layers upon layers of skin.  That is why at times the top layer may seem to fall off, but the rest of the growth remains and re-grows.    Treatment Seborrheic keratoses do not need to be treated, but can easily be removed in the office.  Seborrheic keratoses often cause symptoms when they rub on clothing or jewelry.  Lesions can be in the way of shaving.  If they become inflamed, they can cause itching, soreness, or burning.  Removal of a seborrheic keratosis can be accomplished by freezing, burning, or surgery. If any spot bleeds, scabs, or grows rapidly, please return to have it checked, as these can be an indication of a skin cancer.     Pre-Operative Instructions  You are scheduled for a surgical procedure at [facility]. We recommend you read the following instructions. If you have any questions or concerns, please call the office at [phone].  Shower and wash the entire body with soap and water the day of your surgery paying special attention to cleansing at and around the planned surgery site.  Avoid aspirin or aspirin containing products at least fourteen (14) days prior to your surgical  procedure and for at least one week (7 Days) after your surgical procedure. If you take aspirin on a regular basis for heart disease or history of stroke or for any other reason, we may recommend you continue taking aspirin but please notify us if you take this on a regular basis. Aspirin can cause more bleeding to occur during surgery as well as prolonged bleeding and bruising after surgery.   Avoid other nonsteroidal pain medications at least one week prior to surgery and at least one week prior to your surgery. These include medications such as Ibuprofen (Motrin, Advil and Nuprin), Naprosyn, Voltaren, Relafen, etc. If medications are used for therapeutic reasons, please inform us as they can cause increased bleeding or prolonged bleeding during and bruising after surgical procedures.   Please advise [place] if you are taking any "blood thinner" medications such as Coumadin or Dipyridamole or Plavix or similar medications. These cause increased bleeding and prolonged bleeding during procedures and bruising after surgical procedures. We may have to consider discontinuing these medications briefly prior to and shortly after your surgery if safe to do so.   Please inform us of all medications you are currently taking. All medications that are taken regularly should be taken the day of surgery as you always do. Nevertheless, we need to be informed of what medications you are taking prior to surgery to know whether they will affect the procedure or cause any complications.   Please inform us of any medication allergies. Also inform  us of whether you have allergies to Latex or rubber products or whether you have had any adverse reaction to Lidocaine or Epinephrine.  Please inform us of any prosthetic or artificial body parts such as artificial heart valve, joint replacements, etc., or similar condition that might require preoperative antibiotics.   We recommend avoidance of alcohol at least two weeks prior to  surgery and continued avoidance for at least two weeks after surgery.   We recommend discontinuation of tobacco smoking at least two weeks prior to surgery and continued abstinence for at least two weeks after surgery.  Do not plan strenuous exercise, strenuous work or strenuous lifting for approximately four weeks after your surgery.   We request if you are unable to make your scheduled surgical appointment, please call us at least a week in advance or as soon as you are aware of a problem so that we can cancel or reschedule the appointment.   You MAY TAKE TYLENOL (acetaminophen) for pain as it is not a blood thinner.   PLEASE PLAN TO BE IN TOWN FOR TWO WEEKS FOLLOWING SURGERY, THIS IS IMPORTANT SO YOU CAN BE CHECKED FOR DRESSING CHANGES, SUTURE REMOVAL AND TO MONITOR FOR POSSIBLE COMPLICATIONS.  Due to recent changes in healthcare laws, you may see results of your pathology and/or laboratory studies on MyChart before the doctors have had a chance to review them. We understand that in some cases there may be results that are confusing or concerning to you. Please understand that not all results are received at the same time and often the doctors may need to interpret multiple results in order to provide you with the best plan of care or course of treatment. Therefore, we ask that you please give [place] 2 business days to thoroughly review all your results before contacting the office for clarification. Should we see a critical lab result, you will be contacted sooner.   If You Need Anything After Your Visit  If you have any questions or concerns for your doctor, please call our main line at [phone] and press option 4 to reach your doctor's medical assistant. If no one answers, please leave a voicemail as directed and we will return your call as soon as possible. Messages left after 4 pm will be answered the following business day.   You may also send [place] a message via [place]. We typically respond  to MyChart messages within 1-2 business days.  For prescription refills, please ask your pharmacy to contact our office. Our fax number is [phone].  If you have an urgent issue when the clinic is closed that cannot wait until the next business day, you can page your doctor at the number below.    Please note that while we do our best to be available for urgent issues outside of office hours, we are not available 24/7.   If you have an urgent issue and are unable to reach [place], you may choose to seek medical care at your doctor's office, retail clinic, urgent care center, or emergency room.  If you have a medical emergency, please immediately call 911 or go to the emergency department.  Pager Numbers  - Dr. Nehemiah Massed: [phone]  - Dr. Laurence Ferrari: [phone]  - Dr. Nicole Kindred: [phone]  In the event of inclement weather, please call our main line at [phone] for an update on the status of any delays or closures.  Dermatology Medication Tips: Please keep the boxes that topical medications come in in order to help  keep track of the instructions about where and how to use these. Pharmacies typically print the medication instructions only on the boxes and not directly on the medication tubes.   If your medication is too expensive, please contact our office at [phone] option 4 or send [place] a message through [place].   We are unable to tell what your co-pay for medications will be in advance as this is different depending on your insurance coverage. However, we may be able to find a substitute medication at lower cost or fill out paperwork to get insurance to cover a needed medication.   If a prior authorization is required to get your medication covered by your insurance company, please allow [place] 1-2 business days to complete this process.  Drug prices often vary depending on where the prescription is filled and some pharmacies may offer cheaper prices.  The website www.goodrx.com  contains coupons for medications through different pharmacies. The prices here do not account for what the cost may be with help from insurance (it may be cheaper with your insurance), but the website can give you the price if you did not use any insurance.  - You can print the associated coupon and take it with your prescription to the pharmacy.  - You may also stop by our office during regular business hours and pick up a GoodRx coupon card.  - If you need your prescription sent electronically to a different pharmacy, notify our office through [facility] or by phone at [phone] option 4.     Si Usted Necesita Algo Despus de Su Visita  Tambin puede enviarnos un mensaje a travs de Pharmacist, community. Por lo general respondemos a los mensajes de MyChart en el transcurso de 1 a 2 das hbiles.  Para renovar recetas, por favor pida a su farmacia que se ponga en contacto con nuestra oficina. Harland Dingwall de fax es [place] [phone].  Si tiene un asunto urgente cuando la clnica est cerrada y que no puede esperar hasta el siguiente da hbil, puede llamar/localizar a su doctor(a) al nmero que aparece a continuacin.   Por favor, tenga en cuenta que aunque hacemos todo lo posible para estar disponibles para asuntos urgentes fuera del horario de [place], no estamos disponibles las 24 horas del da, los 7 das de la [place].   Si tiene un problema urgente y no puede comunicarse con nosotros, puede optar por buscar atencin mdica  en el consultorio de su doctor(a), en una clnica privada, en un centro de atencin urgente o en una sala de emergencias.  Si tiene Engineering geologist, por favor llame inmediatamente al 911 o vaya a la sala de emergencias.  Nmeros de bper  - Dr. Nehemiah Massed: [phone]  - Dra. Moye: [phone]  - Dra. Nicole Kindred: [phone]  En caso de inclemencias del [place], por favor llame a Johnsie Kindred principal al [phone] para una actualizacin sobre el [place]  de cualquier retraso o cierre.  Consejos para la medicacin en dermatologa: Por favor, guarde las cajas en las que vienen los medicamentos de uso tpico para ayudarle a seguir las instrucciones sobre dnde y cmo usarlos. Las farmacias generalmente imprimen las instrucciones del medicamento slo en las cajas y no directamente en los tubos del [place].   Si su medicamento es muy caro, por favor, pngase en contacto con Zigmund Daniel llamando al [phone] y presione la opcin 4 o envenos un mensaje a travs de Pharmacist, community.   No podemos decirle cul ser su copago por los  medicamentos por adelantado ya que esto es diferente dependiendo de la cobertura de su seguro. Sin embargo, es posible que podamos encontrar un medicamento sustituto a Electrical engineer un formulario para que el seguro cubra el medicamento que se considera necesario.   Si se requiere una autorizacin previa para que su compaa de seguros [place] su medicamento, por favor permtanos de 1 a 2 das hbiles para completar este proceso.  Los precios de los medicamentos varan con frecuencia dependiendo del Environmental consultant de dnde se surte la receta y alguna farmacias pueden ofrecer precios ms baratos.  El sitio web www.goodrx.com tiene cupones para medicamentos de Airline pilot. Los precios aqu no tienen en cuenta lo que podra costar con la ayuda del seguro (puede ser ms barato con su seguro), pero el sitio web puede darle el precio si no utiliz Research scientist (physical sciences).  - Puede imprimir el cupn correspondiente y llevarlo con su receta a la farmacia.  - Tambin puede pasar por nuestra oficina durante el horario de atencin regular y Charity fundraiser una tarjeta de cupones de GoodRx.  - Si necesita que su receta se enve electrnicamente a una farmacia diferente, informe a nuestra oficina a travs de MyChart de [place] o por telfono llamando al [phone] y presione la opcin 4.

## 2022-07-08 ENCOUNTER — Other Ambulatory Visit: Payer: Self-pay

## 2022-07-22 ENCOUNTER — Other Ambulatory Visit (HOSPITAL_COMMUNITY): Payer: Self-pay

## 2022-07-22 ENCOUNTER — Other Ambulatory Visit: Payer: Self-pay

## 2022-08-06 ENCOUNTER — Other Ambulatory Visit: Payer: Self-pay | Admitting: Family Medicine

## 2022-08-06 ENCOUNTER — Other Ambulatory Visit: Payer: Self-pay

## 2022-08-09 MED ORDER — OZEMPIC (1 MG/DOSE) 4 MG/3ML ~~LOC~~ SOPN
1.0000 mg | PEN_INJECTOR | SUBCUTANEOUS | 1 refills | Status: DC
  Filled 2022-08-09 – 2022-08-16 (×3): qty 3, 28d supply, fill #0
  Filled 2022-09-22: qty 3, 28d supply, fill #1

## 2022-08-09 MED ORDER — ROSUVASTATIN CALCIUM 10 MG PO TABS
10.0000 mg | ORAL_TABLET | Freq: Every day | ORAL | 1 refills | Status: DC
  Filled 2022-08-09: qty 90, 90d supply, fill #0
  Filled 2022-09-22 – 2022-11-06 (×2): qty 90, 90d supply, fill #1

## 2022-08-09 MED ORDER — ALBUTEROL SULFATE HFA 108 (90 BASE) MCG/ACT IN AERS
2.0000 | INHALATION_SPRAY | RESPIRATORY_TRACT | 0 refills | Status: DC | PRN
  Filled 2022-08-09: qty 6.7, 17d supply, fill #0

## 2022-08-10 ENCOUNTER — Other Ambulatory Visit (HOSPITAL_COMMUNITY): Payer: Self-pay

## 2022-08-10 ENCOUNTER — Other Ambulatory Visit: Payer: Self-pay

## 2022-08-16 ENCOUNTER — Other Ambulatory Visit: Payer: Self-pay

## 2022-09-09 ENCOUNTER — Encounter: Payer: Self-pay | Admitting: Family Medicine

## 2022-09-22 ENCOUNTER — Other Ambulatory Visit: Payer: Self-pay

## 2022-09-22 ENCOUNTER — Other Ambulatory Visit: Payer: Self-pay | Admitting: Family Medicine

## 2022-09-22 ENCOUNTER — Other Ambulatory Visit (HOSPITAL_COMMUNITY): Payer: Self-pay

## 2022-09-22 DIAGNOSIS — K219 Gastro-esophageal reflux disease without esophagitis: Secondary | ICD-10-CM

## 2022-09-22 DIAGNOSIS — I10 Essential (primary) hypertension: Secondary | ICD-10-CM

## 2022-09-22 MED ORDER — AMLODIPINE BESYLATE 5 MG PO TABS
5.0000 mg | ORAL_TABLET | Freq: Every day | ORAL | 0 refills | Status: DC
  Filled 2022-09-22 – 2022-11-06 (×2): qty 90, 90d supply, fill #0

## 2022-09-22 MED ORDER — LOSARTAN POTASSIUM 100 MG PO TABS
100.0000 mg | ORAL_TABLET | Freq: Every day | ORAL | 0 refills | Status: DC
  Filled 2022-09-22 – 2022-11-06 (×2): qty 90, 90d supply, fill #0

## 2022-09-22 MED ORDER — ESOMEPRAZOLE MAGNESIUM 40 MG PO CPDR
40.0000 mg | DELAYED_RELEASE_CAPSULE | Freq: Two times a day (BID) | ORAL | 0 refills | Status: DC
  Filled 2022-09-22: qty 180, 90d supply, fill #0

## 2022-09-22 MED ORDER — BUDESONIDE-FORMOTEROL FUMARATE 80-4.5 MCG/ACT IN AERO
2.0000 | INHALATION_SPRAY | Freq: Two times a day (BID) | RESPIRATORY_TRACT | 1 refills | Status: DC
  Filled 2022-09-22: qty 10.2, 30d supply, fill #0
  Filled 2022-10-27: qty 10.2, 30d supply, fill #1

## 2022-09-23 ENCOUNTER — Encounter (HOSPITAL_COMMUNITY): Payer: Self-pay

## 2022-09-23 ENCOUNTER — Other Ambulatory Visit: Payer: Self-pay

## 2022-09-23 ENCOUNTER — Other Ambulatory Visit (HOSPITAL_COMMUNITY): Payer: Self-pay

## 2022-10-27 ENCOUNTER — Other Ambulatory Visit (HOSPITAL_COMMUNITY): Payer: Self-pay

## 2022-11-02 ENCOUNTER — Encounter: Payer: Self-pay | Admitting: Family Medicine

## 2022-11-02 ENCOUNTER — Other Ambulatory Visit: Payer: Self-pay | Admitting: Family Medicine

## 2022-11-02 ENCOUNTER — Other Ambulatory Visit: Payer: Self-pay

## 2022-11-02 DIAGNOSIS — Z125 Encounter for screening for malignant neoplasm of prostate: Secondary | ICD-10-CM

## 2022-11-02 DIAGNOSIS — K760 Fatty (change of) liver, not elsewhere classified: Secondary | ICD-10-CM

## 2022-11-02 DIAGNOSIS — E7800 Pure hypercholesterolemia, unspecified: Secondary | ICD-10-CM

## 2022-11-02 DIAGNOSIS — E119 Type 2 diabetes mellitus without complications: Secondary | ICD-10-CM

## 2022-11-02 DIAGNOSIS — Z130 Encounter for screening for diseases of the blood and blood-forming organs and certain disorders involving the immune mechanism: Secondary | ICD-10-CM

## 2022-11-02 DIAGNOSIS — I10 Essential (primary) hypertension: Secondary | ICD-10-CM

## 2022-11-02 MED ORDER — OZEMPIC (1 MG/DOSE) 4 MG/3ML ~~LOC~~ SOPN
1.0000 mg | PEN_INJECTOR | SUBCUTANEOUS | 1 refills | Status: DC
  Filled 2022-11-02: qty 3, 28d supply, fill #0

## 2022-11-03 NOTE — Telephone Encounter (Signed)
Cook, Jayce G, DO     CBC, CMP, Lipid, A1C, urine microalbumin, PSA  Thank you

## 2022-11-06 ENCOUNTER — Other Ambulatory Visit (HOSPITAL_COMMUNITY): Payer: Self-pay

## 2022-11-08 ENCOUNTER — Other Ambulatory Visit: Payer: Self-pay

## 2022-11-11 DIAGNOSIS — E7800 Pure hypercholesterolemia, unspecified: Secondary | ICD-10-CM | POA: Diagnosis not present

## 2022-11-11 DIAGNOSIS — K760 Fatty (change of) liver, not elsewhere classified: Secondary | ICD-10-CM | POA: Diagnosis not present

## 2022-11-11 DIAGNOSIS — E119 Type 2 diabetes mellitus without complications: Secondary | ICD-10-CM | POA: Diagnosis not present

## 2022-11-11 DIAGNOSIS — Z125 Encounter for screening for malignant neoplasm of prostate: Secondary | ICD-10-CM | POA: Diagnosis not present

## 2022-11-11 DIAGNOSIS — Z130 Encounter for screening for diseases of the blood and blood-forming organs and certain disorders involving the immune mechanism: Secondary | ICD-10-CM | POA: Diagnosis not present

## 2022-11-12 LAB — CMP14+EGFR
ALT: 96 IU/L — ABNORMAL HIGH (ref 0–44)
AST: 82 IU/L — ABNORMAL HIGH (ref 0–40)
Albumin/Globulin Ratio: 2.2
Albumin: 5 g/dL — ABNORMAL HIGH (ref 3.8–4.9)
Alkaline Phosphatase: 89 IU/L (ref 44–121)
BUN/Creatinine Ratio: 14 (ref 9–20)
BUN: 14 mg/dL (ref 6–24)
Bilirubin Total: 0.6 mg/dL (ref 0.0–1.2)
CO2: 24 mmol/L (ref 20–29)
Calcium: 10.2 mg/dL (ref 8.7–10.2)
Chloride: 99 mmol/L (ref 96–106)
Creatinine, Ser: 0.98 mg/dL (ref 0.76–1.27)
Globulin, Total: 2.3 g/dL (ref 1.5–4.5)
Glucose: 118 mg/dL — ABNORMAL HIGH (ref 70–99)
Potassium: 4.9 mmol/L (ref 3.5–5.2)
Sodium: 139 mmol/L (ref 134–144)
Total Protein: 7.3 g/dL (ref 6.0–8.5)
eGFR: 90 mL/min/{1.73_m2} (ref >59)

## 2022-11-12 LAB — LIPID PANEL
Chol/HDL Ratio: 3 ratio (ref 0.0–5.0)
Cholesterol, Total: 157 mg/dL (ref 100–199)
HDL: 53 mg/dL (ref >39)
LDL Chol Calc (NIH): 87 mg/dL (ref 0–99)
Triglycerides: 91 mg/dL (ref 0–149)
VLDL Cholesterol Cal: 17 mg/dL (ref 5–40)

## 2022-11-12 LAB — CBC WITH DIFFERENTIAL/PLATELET
Basophils Absolute: 0.1 10*3/uL (ref 0.0–0.2)
Basos: 1 %
EOS (ABSOLUTE): 0.1 10*3/uL (ref 0.0–0.4)
Eos: 2 %
Hematocrit: 41.2 % (ref 37.5–51.0)
Hemoglobin: 14 g/dL (ref 13.0–17.7)
Immature Grans (Abs): 0 10*3/uL (ref 0.0–0.1)
Immature Granulocytes: 0 %
Lymphocytes Absolute: 1.8 10*3/uL (ref 0.7–3.1)
Lymphs: 22 %
MCH: 30.7 pg (ref 26.6–33.0)
MCHC: 34 g/dL (ref 31.5–35.7)
MCV: 90 fL (ref 79–97)
Monocytes Absolute: 1 10*3/uL — ABNORMAL HIGH (ref 0.1–0.9)
Monocytes: 12 %
Neutrophils Absolute: 5.2 10*3/uL (ref 1.4–7.0)
Neutrophils: 63 %
Platelets: 160 10*3/uL (ref 150–450)
RBC: 4.56 x10E6/uL (ref 4.14–5.80)
RDW: 12.4 % (ref 11.6–15.4)
WBC: 8.2 10*3/uL (ref 3.4–10.8)

## 2022-11-12 LAB — MICROALBUMIN / CREATININE URINE RATIO
Creatinine, Urine: 80.7 mg/dL
Microalb/Creat Ratio: 16 mg/g creat (ref 0–29)
Microalbumin, Urine: 12.7 ug/mL

## 2022-11-12 LAB — HEMOGLOBIN A1C
Est. average glucose Bld gHb Est-mCnc: 137 mg/dL
Hgb A1c MFr Bld: 6.4 % — ABNORMAL HIGH (ref 4.8–5.6)

## 2022-11-12 LAB — PSA: Prostate Specific Ag, Serum: 0.8 ng/mL (ref 0.0–4.0)

## 2022-11-17 ENCOUNTER — Other Ambulatory Visit: Payer: Self-pay

## 2022-11-17 ENCOUNTER — Ambulatory Visit: Payer: Commercial Managed Care - PPO | Admitting: Nurse Practitioner

## 2022-11-17 ENCOUNTER — Other Ambulatory Visit (HOSPITAL_COMMUNITY): Payer: Self-pay

## 2022-11-17 ENCOUNTER — Ambulatory Visit: Payer: 59 | Admitting: Family Medicine

## 2022-11-17 VITALS — BP 124/78 | HR 80 | Temp 97.0°F | Ht 71.0 in | Wt 262.8 lb

## 2022-11-17 DIAGNOSIS — E7800 Pure hypercholesterolemia, unspecified: Secondary | ICD-10-CM | POA: Diagnosis not present

## 2022-11-17 DIAGNOSIS — I10 Essential (primary) hypertension: Secondary | ICD-10-CM | POA: Diagnosis not present

## 2022-11-17 DIAGNOSIS — Z7984 Long term (current) use of oral hypoglycemic drugs: Secondary | ICD-10-CM

## 2022-11-17 DIAGNOSIS — K760 Fatty (change of) liver, not elsewhere classified: Secondary | ICD-10-CM | POA: Diagnosis not present

## 2022-11-17 DIAGNOSIS — E119 Type 2 diabetes mellitus without complications: Secondary | ICD-10-CM

## 2022-11-17 MED ORDER — OZEMPIC (2 MG/DOSE) 8 MG/3ML ~~LOC~~ SOPN
2.0000 mg | PEN_INJECTOR | SUBCUTANEOUS | 1 refills | Status: DC
  Filled 2022-11-17 – 2022-11-19 (×3): qty 9, 84d supply, fill #0
  Filled 2022-11-25: qty 3, 28d supply, fill #0
  Filled 2022-12-18: qty 3, 28d supply, fill #1
  Filled 2023-01-15: qty 3, 28d supply, fill #2
  Filled 2023-02-11: qty 3, 28d supply, fill #3
  Filled 2023-03-16: qty 3, 28d supply, fill #4
  Filled 2023-04-03 – 2023-04-12 (×2): qty 3, 28d supply, fill #5

## 2022-11-17 NOTE — Assessment & Plan Note (Signed)
Needs to avoid alcohol.  Needs weight loss.  Currently on GLP-1.

## 2022-11-17 NOTE — Assessment & Plan Note (Signed)
Continue Crestor 

## 2022-11-17 NOTE — Progress Notes (Signed)
Subjective:  Patient ID: Christopher Bowers, male    DOB: 1966/05/23  Age: 57 y.o. MRN: [id number]  CC: Chief Complaint  Patient presents with   Hypertension   Diabetes    Fbs at home 118    HPI:  57 year old male with the below mentioned medical problems presents for follow-up.  Patient continues to have elevated liver enzymes.  This is secondary to alcohol use and fatty liver disease.  Patient needs weight loss.  Patient needs to avoid alcohol.  Lipids fairly well-controlled on Crestor.  Most recent LDL 87.  Would like this to be less than 70.  Type 2 diabetes is stable.  A1c at goal (currently 6.4).  Doing well on Ozempic and Metformin.  He is interested and increasing dose of Ozempic.  Patient has not had an eye exam in quite some time.  Needs to get this done.  We have upcoming availability for retinal scan.  Hypertension stable on amlodipine and losartan.  Patient Active Problem List   Diagnosis Date Noted   Type 2 diabetes mellitus (HCC) 11/16/2021   Allergic rhinitis 09/28/2021   Hyperlipidemia 05/11/2021   GERD (gastroesophageal reflux disease) 05/11/2021   Fatty liver 02/03/2021   Major depressive disorder with single episode, in remission (HCC) 01/08/2020   Asthmatic bronchitis, moderate persistent, uncomplicated 03/22/2017   Generalized anxiety disorder 02/05/2017   Obstructive sleep apnea 07/21/2016   Obesity 07/08/2012   Essential hypertension 07/08/2012    Social Hx   Social History   Socioeconomic History   Marital status: Married    Spouse name: Not on file   Number of children: 1   Years of education: college   Highest education level: Not on file  Occupational History   Occupation: Investment banker, corporate: [employer]    Comment: truck  Tobacco Use   Smoking status: Never   Smokeless tobacco: Current    Types: Snuff    Last attempt to quit: 02/26/2015   Tobacco comments:    "occasional snuff"  Vaping Use   Vaping Use: Never used  Substance and  Sexual Activity   Alcohol use: Yes    Alcohol/week: 5.0 standard drinks of alcohol    Types: 5 Shots of liquor per week    Comment: 5-6 liquor drinks per day   Drug use: No   Sexual activity: Not on file  Other Topics Concern   Not on file  Social History Narrative   Patient drinks about 3 cups of caffeine daily.   Patient is right handed.   Social Determinants of Health   Financial Resource Strain: Not on file  Food Insecurity: Not on file  Transportation Needs: Not on file  Physical Activity: Not on file  Stress: Not on file  Social Connections: Not on file    Review of Systems  Constitutional: Negative.   Respiratory: Negative.    Cardiovascular: Negative.    Objective:  BP 124/78   Pulse 80   Temp (!) 97 F (36.1 C)   Ht 5\' 11"  (1.803 m)   Wt 262 lb 12.8 oz (119.2 kg)   SpO2 96%   BMI 36.65 kg/m      11/17/2022    8:30 AM 07/07/2022   10:55 AM 05/18/2022   10:29 AM  BP/Weight  Systolic BP 124 119 118  Diastolic BP 78 82 74  Wt. (Lbs) 262.8  268.4  BMI 36.65 kg/m2  37.43 kg/m2    Physical Exam Vitals and nursing  note reviewed.  Constitutional:      General: He is not in acute distress.    Appearance: Normal appearance. He is obese.  HENT:     Head: Normocephalic and atraumatic.  Eyes:     General:        Right eye: No discharge.        Left eye: No discharge.     Conjunctiva/sclera: Conjunctivae normal.  Cardiovascular:     Rate and Rhythm: Normal rate and regular rhythm.  Pulmonary:     Effort: Pulmonary effort is normal.     Breath sounds: Normal breath sounds. No wheezing, rhonchi or rales.  Neurological:     Mental Status: He is alert.  Psychiatric:        Mood and Affect: Mood normal.        Behavior: Behavior normal.     Lab Results  Component Value Date   WBC 8.2 11/11/2022   HGB 14.0 11/11/2022   HCT 41.2 11/11/2022   PLT 160 11/11/2022   GLUCOSE 118 (H) 11/11/2022   CHOL 157 11/11/2022   TRIG 91 11/11/2022   HDL 53  11/11/2022   LDLCALC 87 11/11/2022   ALT 96 (H) 11/11/2022   AST 82 (H) 11/11/2022   NA 139 11/11/2022   K 4.9 11/11/2022   CL 99 11/11/2022   CREATININE 0.98 11/11/2022   BUN 14 11/11/2022   CO2 24 11/11/2022   TSH 6.190 (H) 08/02/2013   HGBA1C 6.4 (H) 11/11/2022     Assessment & Plan:   Problem List Items Addressed This Visit       Cardiovascular and Mediastinum   Essential hypertension - Primary    Stable.  Continue amlodipine and losartan.        Digestive   Fatty liver    Needs to avoid alcohol.  Needs weight loss.  Currently on GLP-1.        Endocrine   Type 2 diabetes mellitus (HCC)    Well-controlled.  Increasing Ozempic.      Relevant Medications   Semaglutide, 2 MG/DOSE, (OZEMPIC, 2 MG/DOSE,) 8 MG/3ML SOPN     Other   Hyperlipidemia    Continue Crestor.       Meds ordered this encounter  Medications   Semaglutide, 2 MG/DOSE, (OZEMPIC, 2 MG/DOSE,) 8 MG/3ML SOPN    Sig: Inject 2 mg into the skin once a week.    Dispense:  9 mL    Refill:  1    Follow-up:  Return in about 6 months (around 05/19/2023).  Everlene Other DO [facility]

## 2022-11-17 NOTE — Patient Instructions (Signed)
Continue your medications.  Planning for US of the liver in 2025.  Work on diet and exercise. Avoid/decrease alcohol intake.  Follow up in 6 months.

## 2022-11-17 NOTE — Assessment & Plan Note (Signed)
Stable.  Continue amlodipine and losartan. 

## 2022-11-17 NOTE — Assessment & Plan Note (Signed)
Well-controlled.  Increasing Ozempic.

## 2022-11-19 ENCOUNTER — Other Ambulatory Visit (HOSPITAL_COMMUNITY): Payer: Self-pay

## 2022-11-25 ENCOUNTER — Other Ambulatory Visit (HOSPITAL_COMMUNITY): Payer: Self-pay

## 2022-11-25 ENCOUNTER — Other Ambulatory Visit: Payer: Self-pay

## 2022-11-26 ENCOUNTER — Other Ambulatory Visit: Payer: Self-pay

## 2022-11-26 ENCOUNTER — Other Ambulatory Visit (HOSPITAL_BASED_OUTPATIENT_CLINIC_OR_DEPARTMENT_OTHER): Payer: Self-pay

## 2022-11-26 ENCOUNTER — Other Ambulatory Visit: Payer: Self-pay | Admitting: Family Medicine

## 2022-11-26 ENCOUNTER — Other Ambulatory Visit (HOSPITAL_COMMUNITY): Payer: Self-pay

## 2022-11-26 ENCOUNTER — Other Ambulatory Visit: Payer: Self-pay | Admitting: Nurse Practitioner

## 2022-11-26 DIAGNOSIS — J4540 Moderate persistent asthma, uncomplicated: Secondary | ICD-10-CM

## 2022-11-26 MED ORDER — MONTELUKAST SODIUM 10 MG PO TABS
10.0000 mg | ORAL_TABLET | Freq: Every day | ORAL | 5 refills | Status: DC
Start: 2022-11-26 — End: 2023-07-11
  Filled 2022-11-26 – 2022-12-09 (×3): qty 30, 30d supply, fill #0
  Filled 2022-12-18 – 2023-01-15 (×2): qty 30, 30d supply, fill #1
  Filled 2023-02-02 – 2023-02-11 (×2): qty 30, 30d supply, fill #2
  Filled 2023-03-16: qty 30, 30d supply, fill #3
  Filled 2023-04-03 – 2023-04-12 (×2): qty 30, 30d supply, fill #4
  Filled 2023-05-27: qty 30, 30d supply, fill #5

## 2022-11-26 MED ORDER — BUDESONIDE-FORMOTEROL FUMARATE 80-4.5 MCG/ACT IN AERO
2.0000 | INHALATION_SPRAY | Freq: Two times a day (BID) | RESPIRATORY_TRACT | 1 refills | Status: DC
  Filled 2022-11-26: qty 10.2, 30d supply, fill #0
  Filled 2023-02-02: qty 10.2, 30d supply, fill #1

## 2022-12-01 ENCOUNTER — Other Ambulatory Visit: Payer: Self-pay

## 2022-12-01 ENCOUNTER — Other Ambulatory Visit (HOSPITAL_COMMUNITY): Payer: Self-pay

## 2022-12-03 ENCOUNTER — Encounter: Payer: Self-pay | Admitting: Pharmacist

## 2022-12-03 ENCOUNTER — Other Ambulatory Visit: Payer: Self-pay

## 2022-12-07 ENCOUNTER — Other Ambulatory Visit (HOSPITAL_COMMUNITY): Payer: Self-pay

## 2022-12-08 ENCOUNTER — Other Ambulatory Visit: Payer: Self-pay

## 2022-12-10 ENCOUNTER — Other Ambulatory Visit: Payer: Self-pay

## 2022-12-10 ENCOUNTER — Other Ambulatory Visit (HOSPITAL_COMMUNITY): Payer: Self-pay

## 2022-12-18 ENCOUNTER — Other Ambulatory Visit: Payer: Self-pay | Admitting: Family Medicine

## 2022-12-18 ENCOUNTER — Other Ambulatory Visit (HOSPITAL_COMMUNITY): Payer: Self-pay

## 2022-12-18 DIAGNOSIS — K219 Gastro-esophageal reflux disease without esophagitis: Secondary | ICD-10-CM

## 2022-12-18 DIAGNOSIS — I10 Essential (primary) hypertension: Secondary | ICD-10-CM

## 2022-12-20 ENCOUNTER — Other Ambulatory Visit (HOSPITAL_COMMUNITY): Payer: Self-pay

## 2022-12-20 ENCOUNTER — Other Ambulatory Visit: Payer: Self-pay

## 2022-12-20 MED ORDER — METFORMIN HCL 500 MG PO TABS
500.0000 mg | ORAL_TABLET | Freq: Two times a day (BID) | ORAL | 3 refills | Status: DC
  Filled 2022-12-20 – 2023-02-02 (×2): qty 180, 90d supply, fill #0
  Filled 2023-05-01: qty 180, 90d supply, fill #1
  Filled 2023-07-05 – 2023-07-13 (×2): qty 180, 90d supply, fill #2
  Filled 2023-08-06 – 2023-10-25 (×3): qty 180, 90d supply, fill #3

## 2022-12-20 MED ORDER — ROSUVASTATIN CALCIUM 10 MG PO TABS
10.0000 mg | ORAL_TABLET | Freq: Every day | ORAL | 1 refills | Status: DC
  Filled 2022-12-20 – 2023-02-02 (×2): qty 90, 90d supply, fill #0
  Filled 2023-03-16 – 2023-05-03 (×2): qty 90, 90d supply, fill #1

## 2022-12-20 MED ORDER — ALBUTEROL SULFATE HFA 108 (90 BASE) MCG/ACT IN AERS
2.0000 | INHALATION_SPRAY | RESPIRATORY_TRACT | 0 refills | Status: DC | PRN
  Filled 2022-12-20: qty 6.7, 17d supply, fill #0

## 2022-12-20 MED ORDER — LOSARTAN POTASSIUM 100 MG PO TABS
100.0000 mg | ORAL_TABLET | Freq: Every day | ORAL | 1 refills | Status: DC
Start: 2022-12-20 — End: 2023-08-06
  Filled 2022-12-20 – 2023-02-02 (×2): qty 90, 90d supply, fill #0
  Filled 2023-03-16 – 2023-05-03 (×2): qty 90, 90d supply, fill #1

## 2022-12-20 MED ORDER — ESOMEPRAZOLE MAGNESIUM 40 MG PO CPDR
40.0000 mg | DELAYED_RELEASE_CAPSULE | Freq: Two times a day (BID) | ORAL | 1 refills | Status: DC
Start: 2022-12-20 — End: 2023-07-05
  Filled 2022-12-20: qty 180, 90d supply, fill #0
  Filled 2023-03-16: qty 180, 90d supply, fill #1

## 2022-12-20 MED ORDER — AMLODIPINE BESYLATE 5 MG PO TABS
5.0000 mg | ORAL_TABLET | Freq: Every day | ORAL | 1 refills | Status: DC
Start: 2022-12-20 — End: 2023-08-06
  Filled 2022-12-20 – 2023-02-02 (×2): qty 90, 90d supply, fill #0
  Filled 2023-03-16 – 2023-05-03 (×2): qty 90, 90d supply, fill #1

## 2022-12-29 ENCOUNTER — Ambulatory Visit: Payer: 59

## 2023-01-13 ENCOUNTER — Telehealth: Payer: 59 | Admitting: Physician Assistant

## 2023-01-13 DIAGNOSIS — B9689 Other specified bacterial agents as the cause of diseases classified elsewhere: Secondary | ICD-10-CM

## 2023-01-13 DIAGNOSIS — J4541 Moderate persistent asthma with (acute) exacerbation: Secondary | ICD-10-CM | POA: Diagnosis not present

## 2023-01-13 DIAGNOSIS — J069 Acute upper respiratory infection, unspecified: Secondary | ICD-10-CM

## 2023-01-13 MED ORDER — PREDNISONE 20 MG PO TABS: 40.0000 mg | ORAL_TABLET | Freq: Every day | ORAL | 0 refills | Status: DC

## 2023-01-13 MED ORDER — AZITHROMYCIN 250 MG PO TABS
ORAL_TABLET | ORAL | 0 refills | Status: AC
Start: 2023-01-13 — End: 2023-01-18

## 2023-01-13 NOTE — Progress Notes (Signed)
E-Visit for Cough  We are sorry that you are not feeling well.  Here is how we plan to help!  Based on your presentation I believe you most likely have A cough due to bacteria.  When patients have a fever and a productive cough with a change in color or increased sputum production, we are concerned about bacterial bronchitis.  If left untreated it can progress to pneumonia.  If your symptoms do not improve with your treatment plan it is important that you contact your provider.   I have prescribed Azithromyin 250 mg: two tablets now and then one tablet daily for 4 additonal days    In addition you may use A non-prescription cough medication called Mucinex DM: take 2 tablets every 12 hours.  Prednisone 20mg  Take 2 tablets (40mg ) daily with food for 5 days.  From your responses in the eVisit questionnaire you describe inflammation in the upper respiratory tract which is causing a significant cough.  This is commonly called Bronchitis and has four common causes:   Allergies Viral Infections Acid Reflux Bacterial Infection Allergies, viruses and acid reflux are treated by controlling symptoms or eliminating the cause. An example might be a cough caused by taking certain blood pressure medications. You stop the cough by changing the medication. Another example might be a cough caused by acid reflux. Controlling the reflux helps control the cough.  USE OF BRONCHODILATOR ("RESCUE") INHALERS: There is a risk from using your bronchodilator too frequently.  The risk is that over-reliance on a medication which only relaxes the muscles surrounding the breathing tubes can reduce the effectiveness of medications prescribed to reduce swelling and congestion of the tubes themselves.  Although you feel brief relief from the bronchodilator inhaler, your asthma may actually be worsening with the tubes becoming more swollen and filled with mucus.  This can delay other crucial treatments, such as oral steroid  medications. If you need to use a bronchodilator inhaler daily, several times per day, you should discuss this with your provider.  There are probably better treatments that could be used to keep your asthma under control.     HOME CARE Only take medications as instructed by your medical team. Complete the entire course of an antibiotic. Drink plenty of fluids and get plenty of rest. Avoid close contacts especially the very young and the elderly Cover your mouth if you cough or cough into your sleeve. Always remember to wash your hands A steam or ultrasonic humidifier can help congestion.   GET HELP RIGHT AWAY IF: You develop worsening fever. You become short of breath You cough up blood. Your symptoms persist after you have completed your treatment plan MAKE SURE YOU  Understand these instructions. Will watch your condition. Will get help right away if you are not doing well or get worse.    Thank you for choosing an e-visit.  Your e-visit answers were reviewed by a board certified advanced clinical practitioner to complete your personal care plan. Depending upon the condition, your plan could have included both over the counter or prescription medications.  Please review your pharmacy choice. Make sure the pharmacy is open so you can pick up prescription now. If there is a problem, you may contact your provider through [employer] and have the prescription routed to another pharmacy.  Your safety is important to [place]. If you have drug allergies check your prescription carefully.   For the next 24 hours you can use MyChart to ask questions about today's  visit, request a non-urgent call back, or ask for a work or school excuse. You will get an email in the next two days asking about your experience. I hope that your e-visit has been valuable and will speed your recovery.  I have spent 5 minutes in review of e-visit questionnaire, review and updating patient chart, medical decision  making and response to patient.   Margaretann Loveless, PA-C

## 2023-01-15 ENCOUNTER — Other Ambulatory Visit (HOSPITAL_COMMUNITY): Payer: Self-pay

## 2023-01-17 ENCOUNTER — Other Ambulatory Visit: Payer: Self-pay

## 2023-01-20 ENCOUNTER — Other Ambulatory Visit: Payer: Self-pay

## 2023-01-26 ENCOUNTER — Ambulatory Visit: Payer: 59 | Admitting: *Deleted

## 2023-01-26 DIAGNOSIS — E119 Type 2 diabetes mellitus without complications: Secondary | ICD-10-CM

## 2023-01-26 NOTE — Progress Notes (Signed)
   Subjective:    Patient ID: Christopher Bowers, male    DOB: 06-17-1965, 57 y.o.   MRN: [id number]  HPI   Christopher Bowers arrived 01/26/2023 and has given verbal consent to obtain images and complete their overdue diabetic retinal screening.  The images have been sent to an ophthalmologist or optometrist for review and interpretation.  Results will be sent back to [place], DO for review.  Patient has been informed they will be contacted when we receive the results via telephone or MyChart     Review of Systems     Objective:   Physical Exam        Assessment & Plan:

## 2023-02-02 ENCOUNTER — Other Ambulatory Visit: Payer: Self-pay | Admitting: Family Medicine

## 2023-02-02 ENCOUNTER — Other Ambulatory Visit: Payer: Self-pay

## 2023-02-02 ENCOUNTER — Other Ambulatory Visit (HOSPITAL_COMMUNITY): Payer: Self-pay

## 2023-02-02 MED ORDER — ALBUTEROL SULFATE HFA 108 (90 BASE) MCG/ACT IN AERS
2.0000 | INHALATION_SPRAY | RESPIRATORY_TRACT | 2 refills | Status: DC | PRN
  Filled 2023-02-02: qty 6.7, 16d supply, fill #0
  Filled 2023-04-03: qty 6.7, 16d supply, fill #1
  Filled 2023-07-05: qty 6.7, 16d supply, fill #2

## 2023-02-02 NOTE — Progress Notes (Signed)
We have received your retinal screening report and it shows no diabetic retinopathy.I have printed and routed results to provider.

## 2023-02-08 ENCOUNTER — Encounter: Payer: Self-pay | Admitting: Family Medicine

## 2023-02-11 ENCOUNTER — Other Ambulatory Visit: Payer: Self-pay

## 2023-02-11 ENCOUNTER — Other Ambulatory Visit (HOSPITAL_COMMUNITY): Payer: Self-pay

## 2023-02-15 ENCOUNTER — Ambulatory Visit: Payer: 59 | Admitting: Dermatology

## 2023-03-01 ENCOUNTER — Other Ambulatory Visit: Payer: Self-pay

## 2023-03-01 ENCOUNTER — Ambulatory Visit: Payer: 59 | Admitting: Dermatology

## 2023-03-01 ENCOUNTER — Other Ambulatory Visit (HOSPITAL_COMMUNITY): Payer: Self-pay

## 2023-03-01 ENCOUNTER — Encounter: Payer: Self-pay | Admitting: Dermatology

## 2023-03-01 VITALS — BP 130/84 | HR 81

## 2023-03-01 DIAGNOSIS — L739 Follicular disorder, unspecified: Secondary | ICD-10-CM

## 2023-03-01 DIAGNOSIS — L720 Epidermal cyst: Secondary | ICD-10-CM | POA: Diagnosis not present

## 2023-03-01 DIAGNOSIS — T148XXA Other injury of unspecified body region, initial encounter: Secondary | ICD-10-CM

## 2023-03-01 DIAGNOSIS — S0001XA Abrasion of scalp, initial encounter: Secondary | ICD-10-CM

## 2023-03-01 DIAGNOSIS — L821 Other seborrheic keratosis: Secondary | ICD-10-CM

## 2023-03-01 MED ORDER — CLINDAMYCIN PHOSPHATE 1 % EX SOLN
CUTANEOUS | 2 refills | Status: DC
  Filled 2023-03-01: qty 60, 30d supply, fill #0
  Filled 2023-04-03: qty 60, 30d supply, fill #1
  Filled 2023-05-03: qty 60, 30d supply, fill #2

## 2023-03-01 NOTE — Patient Instructions (Addendum)
Start Clindamycin solution twice daily as needed.  Start applying hibiclens wash to the scalp when you shower      Pre-Operative Instructions  You are scheduled for a surgical procedure at [facility]. We recommend you read the following instructions. If you have any questions or concerns, please call the office at [phone].  Shower and wash the entire body with soap and water the day of your surgery paying special attention to cleansing at and around the planned surgery site.  Avoid aspirin or aspirin containing products at least fourteen (14) days prior to your surgical procedure and for at least one week (7 Days) after your surgical procedure. If you take aspirin on a regular basis for heart disease or history of stroke or for any other reason, we may recommend you continue taking aspirin but please notify us if you take this on a regular basis. Aspirin can cause more bleeding to occur during surgery as well as prolonged bleeding and bruising after surgery.   Avoid other nonsteroidal pain medications at least one week prior to surgery and at least one week prior to your surgery. These include medications such as Ibuprofen (Motrin, Advil and Nuprin), Naprosyn, Voltaren, Relafen, etc. If medications are used for therapeutic reasons, please inform us as they can cause increased bleeding or prolonged bleeding during and bruising after surgical procedures.   Please advise [place] if you are taking any "blood thinner" medications such as Coumadin or Dipyridamole or Plavix or similar medications. These cause increased bleeding and prolonged bleeding during procedures and bruising after surgical procedures. We may have to consider discontinuing these medications briefly prior to and shortly after your surgery if safe to do so.   Please inform us of all medications you are currently taking. All medications that are taken regularly should be taken the day of surgery as you always do.  Nevertheless, we need to be informed of what medications you are taking prior to surgery to know whether they will affect the procedure or cause any complications.   Please inform us of any medication allergies. Also inform us of whether you have allergies to Latex or rubber products or whether you have had any adverse reaction to Lidocaine or Epinephrine.  Please inform us of any prosthetic or artificial body parts such as artificial heart valve, joint replacements, etc., or similar condition that might require preoperative antibiotics.   We recommend avoidance of alcohol at least two weeks prior to surgery and continued avoidance for at least two weeks after surgery.   We recommend discontinuation of tobacco smoking at least two weeks prior to surgery and continued abstinence for at least two weeks after surgery.  Do not plan strenuous exercise, strenuous work or strenuous lifting for approximately four weeks after your surgery.   We request if you are unable to make your scheduled surgical appointment, please call us at least a week in advance or as soon as you are aware of a problem so that we can cancel or reschedule the appointment.   You MAY TAKE TYLENOL (acetaminophen) for pain as it is not a blood thinner.   PLEASE PLAN TO BE IN TOWN FOR TWO WEEKS FOLLOWING SURGERY, THIS IS IMPORTANT SO YOU CAN BE CHECKED FOR DRESSING CHANGES, SUTURE REMOVAL AND TO MONITOR FOR POSSIBLE COMPLICATIONS.     Due to recent changes in healthcare laws, you may see results of your pathology and/or laboratory studies on MyChart before the doctors have had a chance to review them. We  understand that in some cases there may be results that are confusing or concerning to you. Please understand that not all results are received at the same time and often the doctors may need to interpret multiple results in order to provide you with the best plan of care or course of treatment. Therefore, we ask that you please give  [place] 2 business days to thoroughly review all your results before contacting the office for clarification. Should we see a critical lab result, you will be contacted sooner.   If You Need Anything After Your Visit  If you have any questions or concerns for your doctor, please call our main line at [phone] and press option 4 to reach your doctor's medical assistant. If no one answers, please leave a voicemail as directed and we will return your call as soon as possible. Messages left after 4 pm will be answered the following business day.   You may also send [place] a message via MyChart. We typically respond to MyChart messages within 1-2 business days.  For prescription refills, please ask your pharmacy to contact our office. Our fax number is [phone].  If you have an urgent issue when the clinic is closed that cannot wait until the next business day, you can page your doctor at the number below.    Please note that while we do our best to be available for urgent issues outside of office hours, we are not available 24/7.   If you have an urgent issue and are unable to reach [place], you may choose to seek medical care at your doctor's office, retail clinic, urgent care center, or emergency room.  If you have a medical emergency, please immediately call 911 or go to the emergency department.  Pager Numbers  - Dr. Gwen Pounds: [phone]  - Dr. Roseanne Reno: [phone]  - Dr. Katrinka Blazing: [phone]   In the event of inclement weather, please call our main line at [phone] for an update on the status of any delays or closures.  Dermatology Medication Tips: Please keep the boxes that topical medications come in in order to help keep track of the instructions about where and how to use these. Pharmacies typically print the medication instructions only on the boxes and not directly on the medication tubes.   If your medication is too expensive, please contact our office at [phone] option 4  or send [place] a message through MyChart.   We are unable to tell what your co-pay for medications will be in advance as this is different depending on your insurance coverage. However, we may be able to find a substitute medication at lower cost or fill out paperwork to get insurance to cover a needed medication.   If a prior authorization is required to get your medication covered by your insurance company, please allow [place] 1-2 business days to complete this process.  Drug prices often vary depending on where the prescription is filled and some pharmacies may offer cheaper prices.  The website www.goodrx.com contains coupons for medications through different pharmacies. The prices here do not account for what the cost may be with help from insurance (it may be cheaper with your insurance), but the website can give you the price if you did not use any insurance.  - You can print the associated coupon and take it with your prescription to the pharmacy.  - You may also stop by our office during regular business hours and pick up a GoodRx coupon card.  - If  you need your prescription sent electronically to a different pharmacy, notify our office through [facility] or by phone at [phone] option 4.     Si Usted Necesita Algo Despus de Su Visita  Tambin puede enviarnos un mensaje a travs de Clinical cytogeneticist. Por lo general respondemos a los mensajes de MyChart en el transcurso de 1 a 2 das hbiles.  Para renovar recetas, por favor pida a su farmacia que se ponga en contacto con nuestra oficina. Annie Sable de fax es [place] [phone].  Si tiene un asunto urgente cuando la clnica est cerrada y que no puede esperar hasta el siguiente da hbil, puede llamar/localizar a su doctor(a) al nmero que aparece a continuacin.   Por favor, tenga en cuenta que aunque hacemos todo lo posible para estar disponibles para asuntos urgentes fuera del horario de [place], no estamos disponibles las 24 horas  del da, los 7 [address]  [address] de la [place].   Si tiene un problema urgente y no puede comunicarse con nosotros, puede optar por buscar atencin mdica  en el consultorio de su doctor(a), en una clnica privada, en un centro de atencin urgente o en una sala de emergencias.  Si tiene Engineer, drilling, por favor llame inmediatamente al 911 o vaya a la sala de emergencias.  Nmeros de bper  - Dr. Gwen Pounds: [phone]  - Dra. Roseanne Reno: [phone]  - Dr. Katrinka Blazing: [phone]   En caso de inclemencias del tiempo, por favor llame a Lacy Duverney principal al [phone] para una actualizacin sobre el [place] de cualquier retraso o cierre.  Consejos para la medicacin en dermatologa: Por favor, guarde las cajas en las que vienen los medicamentos de uso tpico para ayudarle a seguir las instrucciones sobre dnde y cmo usarlos. Las farmacias generalmente imprimen las instrucciones del medicamento slo en las cajas y no directamente en los tubos del [place].   Si su medicamento es muy caro, por favor, pngase en contacto con Rolm Gala llamando al [phone] y presione la opcin 4 o envenos un mensaje a travs de Clinical cytogeneticist.   No podemos decirle cul ser su copago por los medicamentos por adelantado ya que esto es diferente dependiendo de la cobertura de su seguro. Sin embargo, es posible que podamos encontrar un medicamento sustituto a Audiological scientist un formulario para que el seguro cubra el medicamento que se considera necesario.   Si se requiere una autorizacin previa para que su compaa de seguros [place] su medicamento, por favor permtanos de 1 a 2 das hbiles para completar [address]  Los precios de los medicamentos varan con frecuencia dependiendo del Environmental consultant de dnde se surte la receta y alguna farmacias pueden ofrecer precios ms baratos.  El sitio web www.goodrx.com tiene cupones para medicamentos de Health and safety inspector. Los precios aqu no tienen en cuenta lo que  podra costar con la ayuda del seguro (puede ser ms barato con su seguro), pero el sitio web puede darle el precio si no utiliz Tourist information centre manager.  - Puede imprimir el cupn correspondiente y llevarlo con su receta a la farmacia.  - Tambin puede pasar por nuestra oficina durante el horario de atencin regular y Education officer, museum una tarjeta de cupones de GoodRx.  - Si necesita que su receta se enve electrnicamente a una farmacia diferente, informe a nuestra oficina a travs de MyChart de [place] o por telfono llamando al [phone] y presione la opcin 4.

## 2023-03-01 NOTE — Progress Notes (Signed)
   Follow-Up Visit   Subjective  Christopher Bowers is a 57 y.o. male who presents for the following: Spots on scalp. Dur: few weeks. Thought was an ingrown hair but will not resolve. Scabs over. Has not bled, denies itching or pain. No personal Hx of skin cancer.   Check cyst on left side of neck. Dur: years. Would like to discuss removal.   The patient has spots, moles and lesions to be evaluated, some may be new or changing and the patient may have concern these could be cancer.    The following portions of the chart were reviewed this encounter and updated as appropriate: medications, allergies, medical history  Review of Systems:  No other skin or systemic complaints except as noted in HPI or Assessment and Plan.  Objective  Well appearing patient in no apparent distress; mood and affect are within normal limits.  A focused examination was performed of the following areas: Scalp, face, neck  Relevant physical exam findings are noted in the Assessment and Plan.         Assessment & Plan   Scalp folliculitis secondary to haircuts with resultant excoriations  Exam: excoriated pink papules and follicular pustules at left occipital scalp  Treatment:  Avoid scratching/picking area. Start Clindamycin solution twice daily as needed.  Start hibiclens wash daily in the shower Shave less frequently and less closely to surface of skin to reduce flares  EPIDERMAL INCLUSION CYST Exam: superficial cystic papule at left neck  Benign-appearing. Exam most consistent with an epidermal inclusion cyst. Discussed that a cyst is a benign growth that can grow over time and sometimes get irritated or inflamed. Recommend observation if it is not bothersome. Discussed option of surgical excision to remove it if it is growing, symptomatic, or other changes noted. Please call for new or changing lesions so they can be evaluated. - patient opts to return for excision  SEBORRHEIC KERATOSIS -  Stuck-on, waxy, tan-brown papules and/or plaques at legs - Benign-appearing - Discussed benign etiology and prognosis. - Observe - Call for any changes    Return for Cyst Excision, Next Available.  I, Lawson Radar, CMA, am acting as scribe for Elie Goody, MD.   Documentation: I have reviewed the above documentation for accuracy and completeness, and I agree with the above.  Elie Goody, MD

## 2023-03-16 ENCOUNTER — Other Ambulatory Visit (HOSPITAL_COMMUNITY): Payer: Self-pay

## 2023-03-17 ENCOUNTER — Other Ambulatory Visit: Payer: Self-pay

## 2023-03-17 ENCOUNTER — Other Ambulatory Visit (HOSPITAL_COMMUNITY): Payer: Self-pay

## 2023-04-03 ENCOUNTER — Other Ambulatory Visit: Payer: Self-pay | Admitting: Family Medicine

## 2023-04-04 ENCOUNTER — Other Ambulatory Visit (HOSPITAL_COMMUNITY): Payer: Self-pay

## 2023-04-04 ENCOUNTER — Other Ambulatory Visit: Payer: Self-pay

## 2023-04-04 MED ORDER — BUDESONIDE-FORMOTEROL FUMARATE 80-4.5 MCG/ACT IN AERO
2.0000 | INHALATION_SPRAY | Freq: Two times a day (BID) | RESPIRATORY_TRACT | 1 refills | Status: DC
  Filled 2023-04-04: qty 10.2, 30d supply, fill #0
  Filled 2023-05-01: qty 10.2, 30d supply, fill #1

## 2023-04-05 ENCOUNTER — Other Ambulatory Visit (HOSPITAL_COMMUNITY): Payer: Self-pay

## 2023-04-12 ENCOUNTER — Other Ambulatory Visit: Payer: Self-pay

## 2023-04-22 ENCOUNTER — Encounter: Payer: Self-pay | Admitting: Family Medicine

## 2023-05-01 ENCOUNTER — Other Ambulatory Visit (HOSPITAL_COMMUNITY): Payer: Self-pay

## 2023-05-02 ENCOUNTER — Other Ambulatory Visit: Payer: Self-pay

## 2023-05-03 ENCOUNTER — Encounter: Payer: Self-pay | Admitting: Physician Assistant

## 2023-05-03 ENCOUNTER — Ambulatory Visit (HOSPITAL_COMMUNITY)
Admission: RE | Admit: 2023-05-03 | Discharge: 2023-05-03 | Disposition: A | Discharge status: Home / Self Care | Payer: 59 | Source: Ambulatory Visit | Attending: Physician Assistant | Admitting: Physician Assistant

## 2023-05-03 ENCOUNTER — Other Ambulatory Visit: Payer: Self-pay

## 2023-05-03 ENCOUNTER — Ambulatory Visit (INDEPENDENT_AMBULATORY_CARE_PROVIDER_SITE_OTHER): Payer: 59 | Admitting: Physician Assistant

## 2023-05-03 VITALS — BP 122/72 | Temp 97.1°F | Ht 71.0 in | Wt 262.0 lb

## 2023-05-03 DIAGNOSIS — Z8709 Personal history of other diseases of the respiratory system: Secondary | ICD-10-CM

## 2023-05-03 DIAGNOSIS — R0602 Shortness of breath: Secondary | ICD-10-CM | POA: Diagnosis not present

## 2023-05-03 DIAGNOSIS — R918 Other nonspecific abnormal finding of lung field: Secondary | ICD-10-CM | POA: Diagnosis not present

## 2023-05-03 DIAGNOSIS — J069 Acute upper respiratory infection, unspecified: Secondary | ICD-10-CM | POA: Insufficient documentation

## 2023-05-03 MED ORDER — PREDNISONE 20 MG PO TABS
ORAL_TABLET | ORAL | 0 refills | Status: AC
Start: 2023-05-03 — End: 2023-05-12

## 2023-05-03 NOTE — Progress Notes (Signed)
Acute Office Visit  Subjective:     Patient ID: Christopher Bowers, male    DOB: [birth year], 57 y.o.   MRN: [id number]   Patient presents today with concerns of cough, sore throat, and headache that began Sunday evening. He states symptoms began Sunday evening but worsened Monday morning. He reports negative covid and flu tests. Patient relates history of respiratory failure due to pneumonia 10 years ago. He relates hypervigilance to cold symptoms to avoid worsening illness after his hospitalized pneumonia. He been using albuterol and tylenol/NSAIDs at home for OTC relief. He is up to date with seasonal influenza vaccine. Patient requests prednisone and antibiotics at today's visit.     Review of Systems  HENT:  Positive for sore throat.   Respiratory:  Positive for cough.   Neurological:  Positive for headaches.  All other systems reviewed and are negative.       Objective:     BP 122/72   Temp (!) 97.1 F (36.2 C) (Oral)   Ht 5\' 11"  (1.803 m)   Wt 262 lb (118.8 kg)   BMI 36.54 kg/m   Physical Exam Constitutional:      Appearance: Normal appearance.  HENT:     Nose: Nose normal.     Mouth/Throat:     Mouth: Mucous membranes are moist.     Pharynx: Oropharynx is clear.  Eyes:     Extraocular Movements: Extraocular movements intact.     Pupils: Pupils are equal, round, and reactive to light.  Cardiovascular:     Rate and Rhythm: Normal rate and regular rhythm.  Pulmonary:     Effort: Pulmonary effort is normal.     Breath sounds: Normal breath sounds. No stridor. No wheezing, rhonchi or rales.  Skin:    General: Skin is dry.  Neurological:     General: No focal deficit present.     Mental Status: He is alert and oriented to person, place, and time.  Psychiatric:        Mood and Affect: Mood normal.        Thought Content: Thought content normal.     No results found for any visits on 05/03/23.      Assessment & Plan:  Acute URI -     predniSONE; Take 1  tablet (20 mg total) by mouth 2 (two) times daily with a meal for 3 days, THEN 1 tablet (20 mg total) daily with breakfast for 3 days, THEN 0.5 tablets (10 mg total) daily with breakfast for 3 days.  Dispense: 10.5 tablet; Refill: 0 -     DG Chest 2 View; Future  History of asthma -     predniSONE; Take 1 tablet (20 mg total) by mouth 2 (two) times daily with a meal for 3 days, THEN 1 tablet (20 mg total) daily with breakfast for 3 days, THEN 0.5 tablets (10 mg total) daily with breakfast for 3 days.  Dispense: 10.5 tablet; Refill: 0  History of acute respiratory failure -     DG Chest 2 View; Future   Patient appears overall well today. His lungs were clear to auscultation bilaterally, no increased work of breathing or advantageous breath sounds today. I have no reason to believe patient is developing furthering illness at this time, however with significant history of pneumonia to respiratory failure, chest XR ordered today to rule out early development of pneumonia. Antibiotics not indicated at this time as symptoms have only recently began and physical exam was relatively  benign. He may continue albuterol inhaler use at home, as well as nebulizer treatments. Patient advised to monitor blood sugars closely while taking prednisone. He is to contact the office if his symptoms do not improve or get worse.   Return in one week if symptoms fail to improve.  Toni Amend Lum Stillinger, PA-C

## 2023-05-03 NOTE — Patient Instructions (Signed)
Let me know if your symptoms are not improving.   Take care!

## 2023-05-04 ENCOUNTER — Other Ambulatory Visit: Payer: Self-pay

## 2023-05-05 ENCOUNTER — Other Ambulatory Visit: Payer: Self-pay

## 2023-05-05 ENCOUNTER — Other Ambulatory Visit: Payer: Self-pay | Admitting: Family Medicine

## 2023-05-05 MED ORDER — OZEMPIC (2 MG/DOSE) 8 MG/3ML ~~LOC~~ SOPN
2.0000 mg | PEN_INJECTOR | SUBCUTANEOUS | 1 refills | Status: DC
  Filled 2023-05-05: qty 3, 28d supply, fill #0
  Filled 2023-06-09: qty 3, 28d supply, fill #1

## 2023-05-06 ENCOUNTER — Other Ambulatory Visit: Payer: Self-pay | Admitting: Family Medicine

## 2023-05-06 ENCOUNTER — Encounter: Payer: Self-pay | Admitting: Physician Assistant

## 2023-05-06 MED ORDER — DOXYCYCLINE HYCLATE 100 MG PO TABS: 100.0000 mg | ORAL_TABLET | Freq: Two times a day (BID) | ORAL | 0 refills | Status: DC

## 2023-05-28 ENCOUNTER — Other Ambulatory Visit (HOSPITAL_COMMUNITY): Payer: Self-pay

## 2023-05-30 ENCOUNTER — Other Ambulatory Visit: Payer: Self-pay

## 2023-06-06 ENCOUNTER — Other Ambulatory Visit: Payer: Self-pay | Admitting: Family Medicine

## 2023-06-06 ENCOUNTER — Encounter: Payer: Self-pay | Admitting: Family Medicine

## 2023-06-06 DIAGNOSIS — Z130 Encounter for screening for diseases of the blood and blood-forming organs and certain disorders involving the immune mechanism: Secondary | ICD-10-CM

## 2023-06-06 DIAGNOSIS — E119 Type 2 diabetes mellitus without complications: Secondary | ICD-10-CM

## 2023-06-06 DIAGNOSIS — K760 Fatty (change of) liver, not elsewhere classified: Secondary | ICD-10-CM

## 2023-06-06 DIAGNOSIS — E7800 Pure hypercholesterolemia, unspecified: Secondary | ICD-10-CM

## 2023-06-08 DIAGNOSIS — Z130 Encounter for screening for diseases of the blood and blood-forming organs and certain disorders involving the immune mechanism: Secondary | ICD-10-CM | POA: Diagnosis not present

## 2023-06-08 DIAGNOSIS — E119 Type 2 diabetes mellitus without complications: Secondary | ICD-10-CM | POA: Diagnosis not present

## 2023-06-08 DIAGNOSIS — E7800 Pure hypercholesterolemia, unspecified: Secondary | ICD-10-CM | POA: Diagnosis not present

## 2023-06-09 ENCOUNTER — Other Ambulatory Visit (HOSPITAL_COMMUNITY): Payer: Self-pay

## 2023-06-09 ENCOUNTER — Other Ambulatory Visit (HOSPITAL_BASED_OUTPATIENT_CLINIC_OR_DEPARTMENT_OTHER): Payer: Self-pay

## 2023-06-09 ENCOUNTER — Other Ambulatory Visit: Payer: Self-pay | Admitting: Family Medicine

## 2023-06-09 ENCOUNTER — Other Ambulatory Visit: Payer: Self-pay

## 2023-06-09 LAB — LIPID PANEL
Chol/HDL Ratio: 3 {ratio} (ref 0.0–5.0)
Cholesterol, Total: 154 mg/dL (ref 100–199)
HDL: 52 mg/dL (ref >39)
LDL Chol Calc (NIH): 89 mg/dL (ref 0–99)
Triglycerides: 68 mg/dL (ref 0–149)
VLDL Cholesterol Cal: 13 mg/dL (ref 5–40)

## 2023-06-09 LAB — CBC
Hematocrit: 43 % (ref 37.5–51.0)
Hemoglobin: 14.1 g/dL (ref 13.0–17.7)
MCH: 30.3 pg (ref 26.6–33.0)
MCHC: 32.8 g/dL (ref 31.5–35.7)
MCV: 92 fL (ref 79–97)
Platelets: 165 10*3/uL (ref 150–450)
RBC: 4.66 x10E6/uL (ref 4.14–5.80)
RDW: 12 % (ref 11.6–15.4)
WBC: 8.8 10*3/uL (ref 3.4–10.8)

## 2023-06-09 LAB — CMP14+EGFR
ALT: 59 [IU]/L — ABNORMAL HIGH (ref 0–44)
AST: 46 [IU]/L — ABNORMAL HIGH (ref 0–40)
Albumin: 4.7 g/dL (ref 3.8–4.9)
Alkaline Phosphatase: 73 [IU]/L (ref 44–121)
BUN/Creatinine Ratio: 12 (ref 9–20)
BUN: 12 mg/dL (ref 6–24)
Bilirubin Total: 0.6 mg/dL (ref 0.0–1.2)
CO2: 24 mmol/L (ref 20–29)
Calcium: 9.9 mg/dL (ref 8.7–10.2)
Chloride: 101 mmol/L (ref 96–106)
Creatinine, Ser: 1 mg/dL (ref 0.76–1.27)
Globulin, Total: 2.5 g/dL (ref 1.5–4.5)
Glucose: 99 mg/dL (ref 70–99)
Potassium: 4.9 mmol/L (ref 3.5–5.2)
Sodium: 142 mmol/L (ref 134–144)
Total Protein: 7.2 g/dL (ref 6.0–8.5)
eGFR: 88 mL/min/{1.73_m2} (ref >59)

## 2023-06-09 LAB — MICROALBUMIN / CREATININE URINE RATIO
Creatinine, Urine: 31.6 mg/dL
Microalb/Creat Ratio: 10 mg/g{creat} (ref 0–29)
Microalbumin, Urine: 3.2 ug/mL

## 2023-06-09 LAB — HEMOGLOBIN A1C
Est. average glucose Bld gHb Est-mCnc: 143 mg/dL
Hgb A1c MFr Bld: 6.6 % — ABNORMAL HIGH (ref 4.8–5.6)

## 2023-06-09 MED ORDER — BUDESONIDE-FORMOTEROL FUMARATE 80-4.5 MCG/ACT IN AERO
2.0000 | INHALATION_SPRAY | Freq: Two times a day (BID) | RESPIRATORY_TRACT | 1 refills | Status: DC
  Filled 2023-06-09: qty 10.2, 30d supply, fill #0
  Filled 2023-08-06: qty 10.2, 30d supply, fill #1

## 2023-06-10 ENCOUNTER — Ambulatory Visit: Payer: Commercial Managed Care - PPO | Admitting: Family Medicine

## 2023-06-10 ENCOUNTER — Other Ambulatory Visit: Payer: Self-pay

## 2023-06-10 VITALS — BP 125/82 | HR 74 | Temp 98.5°F | Ht 71.0 in | Wt 268.4 lb

## 2023-06-10 DIAGNOSIS — I10 Essential (primary) hypertension: Secondary | ICD-10-CM | POA: Diagnosis not present

## 2023-06-10 DIAGNOSIS — K760 Fatty (change of) liver, not elsewhere classified: Secondary | ICD-10-CM | POA: Diagnosis not present

## 2023-06-10 DIAGNOSIS — E7800 Pure hypercholesterolemia, unspecified: Secondary | ICD-10-CM

## 2023-06-10 DIAGNOSIS — E1169 Type 2 diabetes mellitus with other specified complication: Secondary | ICD-10-CM | POA: Diagnosis not present

## 2023-06-10 DIAGNOSIS — E119 Type 2 diabetes mellitus without complications: Secondary | ICD-10-CM

## 2023-06-10 MED ORDER — TIRZEPATIDE 2.5 MG/0.5ML ~~LOC~~ SOAJ
2.5000 mg | SUBCUTANEOUS | 0 refills | Status: DC
  Filled 2023-06-10: qty 2, 28d supply, fill #0

## 2023-06-10 NOTE — Patient Instructions (Signed)
 [place] ordered.  Follow up in 6 months.  Take care  Dr. Adriana Simas

## 2023-06-12 NOTE — Assessment & Plan Note (Signed)
 A1c at goal.  Patient interested in switching medication.  I advised him that insurance is unlikely to cover.  However, given his request I will send it in.  Continue metformin.  Will continue Ozempic.  We will see if we can get [facility] approved.

## 2023-06-12 NOTE — Assessment & Plan Note (Signed)
Stable.  Continue amlodipine and losartan. 

## 2023-06-12 NOTE — Assessment & Plan Note (Signed)
 Spoke with GI physician Dr. Marletta Lor.  He recommended obtaining ultrasound now.  Order placed.

## 2023-06-12 NOTE — Assessment & Plan Note (Signed)
 Fair control.  Would like LDL below 70.  Will continue at current dose of Crestor for now.  Reassess at follow-up.

## 2023-06-12 NOTE — Progress Notes (Signed)
 Subjective:  Patient ID: Christopher Bowers, male    DOB: Aug 28, 1965  Age: 58 y.o. MRN: [id number]  CC:  Follow up   HPI:  58 year old male with the below mentioned medical problems presents for follow-up.  Patient continues to have elevated LFTs although numbers have improved.  He has MASLD.  He is on GLP-1.  Would benefit from further weight loss.  Will reach out to GI regarding frequency of ultrasound.  Would like better control of lipids.  Most recent LDL 89.  He is on Crestor  10 mg daily and tolerating.  A1c is at goal.  A1c 6.6.  Patient is currently on Ozempic  2 mg weekly.  He is interested in switching to another medication to continue to help his blood sugars and help promote weight loss.  He will discuss this today.  Patient Active Problem List   Diagnosis Date Noted   Metabolic dysfunction-associated steatotic liver disease (MASLD) 06/10/2023   Type 2 diabetes mellitus (HCC) 11/16/2021   Allergic rhinitis 09/28/2021   Hyperlipidemia 05/11/2021   GERD (gastroesophageal reflux disease) 05/11/2021   Major depressive disorder with single episode, in remission (HCC) 01/08/2020   Asthmatic bronchitis, moderate persistent, uncomplicated 03/22/2017   Generalized anxiety disorder 02/05/2017   Obstructive sleep apnea 07/21/2016   Obesity 07/08/2012   Essential hypertension 07/08/2012    Social Hx   Social History   Socioeconomic History   Marital status: Married    Spouse name: Not on file   Number of children: 1   Years of education: college   Highest education level: Not on file  Occupational History   Occupation: Investment Banker, Corporate: [employer]    Comment: truck  Tobacco Use   Smoking status: Never   Smokeless tobacco: Current    Types: Snuff    Last attempt to quit: 02/26/2015   Tobacco comments:    occasional snuff  Vaping Use   Vaping status: Never Used  Substance and Sexual Activity   Alcohol use: Yes    Alcohol/week: 5.0 standard drinks of alcohol     Types: 5 Shots of liquor per week    Comment: 5-6 liquor drinks per day   Drug use: No   Sexual activity: Not on file  Other Topics Concern   Not on file  Social History Narrative   Patient drinks about 3 cups of caffeine daily.   Patient is right handed.   Social Drivers of Corporate Investment Banker Strain: Not on file  Food Insecurity: Not on file  Transportation Needs: Not on file  Physical Activity: Not on file  Stress: Not on file  Social Connections: Not on file    Review of Systems  Respiratory: Negative.    Cardiovascular: Negative.    Objective:  BP 125/82   Pulse 74   Temp 98.5 F (36.9 C)   Ht 5' 11 (1.803 m)   Wt 268 lb 6.4 oz (121.7 kg)   SpO2 96%   BMI 37.43 kg/m      06/10/2023    8:28 AM 05/03/2023    9:30 AM 03/01/2023    8:40 AM  BP/Weight  Systolic BP 125 122 130  Diastolic BP 82 72 84  Wt. (Lbs) 268.4 262   BMI 37.43 kg/m2 36.54 kg/m2     Physical Exam Vitals and nursing note reviewed.  Constitutional:      General: He is not in acute distress.    Appearance: Normal appearance.  HENT:  Head: Normocephalic and atraumatic.  Eyes:     General:        Right eye: No discharge.        Left eye: No discharge.     Conjunctiva/sclera: Conjunctivae normal.  Cardiovascular:     Rate and Rhythm: Normal rate and regular rhythm.  Pulmonary:     Effort: Pulmonary effort is normal.     Breath sounds: Normal breath sounds. No wheezing, rhonchi or rales.  Neurological:     Mental Status: He is alert.  Psychiatric:        Mood and Affect: Mood normal.        Behavior: Behavior normal.     Lab Results  Component Value Date   WBC 8.8 06/08/2023   HGB 14.1 06/08/2023   HCT 43.0 06/08/2023   PLT 165 06/08/2023   GLUCOSE 99 06/08/2023   CHOL 154 06/08/2023   TRIG 68 06/08/2023   HDL 52 06/08/2023   LDLCALC 89 06/08/2023   ALT 59 (H) 06/08/2023   AST 46 (H) 06/08/2023   NA 142 06/08/2023   K 4.9 06/08/2023   CL 101 06/08/2023    CREATININE 1.00 06/08/2023   BUN 12 06/08/2023   CO2 24 06/08/2023   TSH 6.190 (H) 08/02/2013   HGBA1C 6.6 (H) 06/08/2023     Assessment & Plan:   Problem List Items Addressed This Visit       Cardiovascular and Mediastinum   Essential hypertension   Stable.  Continue amlodipine  and losartan .        Digestive   Metabolic dysfunction-associated steatotic liver disease (MASLD)   Spoke with GI physician Dr. Cindie.  He recommended obtaining ultrasound now.  Order placed.       Relevant Orders   US  ABDOMEN RUQ W/ELASTOGRAPHY     Endocrine   Type 2 diabetes mellitus (HCC) - Primary   A1c at goal.  Patient interested in switching medication.  I advised him that insurance is unlikely to cover.  However, given his request I will send it in.  Continue metformin .  Will continue Ozempic .  We will see if we can get Mounjaro  approved.      Relevant Medications   tirzepatide  (MOUNJARO ) 2.5 MG/0.5ML Pen     Other   Hyperlipidemia   Fair control.  Would like LDL below 70.  Will continue at current dose of Crestor  for now.  Reassess at follow-up.       Meds ordered this encounter  Medications   tirzepatide  (MOUNJARO ) 2.5 MG/0.5ML Pen    Sig: Inject 2.5 mg into the skin once a week.    Dispense:  2 mL    Refill:  0    Follow-up:  6 months  Moriyah Byington Bluford DO [facility]

## 2023-06-13 ENCOUNTER — Encounter: Payer: Self-pay | Admitting: Dermatology

## 2023-06-13 ENCOUNTER — Other Ambulatory Visit (HOSPITAL_COMMUNITY): Payer: Self-pay

## 2023-06-15 ENCOUNTER — Telehealth: Payer: Self-pay

## 2023-06-15 ENCOUNTER — Encounter: Payer: Self-pay | Admitting: Dermatology

## 2023-06-15 ENCOUNTER — Other Ambulatory Visit: Payer: Self-pay

## 2023-06-15 ENCOUNTER — Ambulatory Visit: Payer: Commercial Managed Care - PPO | Admitting: Dermatology

## 2023-06-15 DIAGNOSIS — B079 Viral wart, unspecified: Secondary | ICD-10-CM

## 2023-06-15 DIAGNOSIS — D485 Neoplasm of uncertain behavior of skin: Secondary | ICD-10-CM

## 2023-06-15 DIAGNOSIS — L720 Epidermal cyst: Secondary | ICD-10-CM

## 2023-06-15 DIAGNOSIS — L821 Other seborrheic keratosis: Secondary | ICD-10-CM | POA: Diagnosis not present

## 2023-06-15 DIAGNOSIS — B078 Other viral warts: Secondary | ICD-10-CM

## 2023-06-15 MED ORDER — FLUOROURACIL 5 % EX CREA
TOPICAL_CREAM | CUTANEOUS | 1 refills | Status: AC
  Filled 2023-06-15: qty 40, 30d supply, fill #0

## 2023-06-15 NOTE — Patient Instructions (Addendum)
 Seborrheic Keratosis-brown spots on his back.   What causes seborrheic keratoses? Seborrheic keratoses are harmless, common skin growths that first appear during adult life.  As time goes by, more growths appear.  Some people may develop a large number of them.  Seborrheic keratoses appear on both covered and uncovered body parts.  They are not caused by sunlight.  The tendency to develop seborrheic keratoses can be inherited.  They vary in color from skin-colored to gray, brown, or even black.  They can be either smooth or have a rough, warty surface.   Seborrheic keratoses are superficial and look as if they were stuck on the skin.  Under the microscope this type of keratosis looks like layers upon layers of skin.  That is why at times the top layer may seem to fall off, but the rest of the growth remains and re-grows.    Treatment Seborrheic keratoses do not need to be treated, but can easily be removed in the office.  Seborrheic keratoses often cause symptoms when they rub on clothing or jewelry.  Lesions can be in the way of shaving.  If they become inflamed, they can cause itching, soreness, or burning.  Removal of a seborrheic keratosis can be accomplished by freezing, burning, or surgery. If any spot bleeds, scabs, or grows rapidly, please return to have it checked, as these can be an indication of a skin cancer.         [employer] WartStick is designed to remove warts   Start Efudex  cream apply to affected warts on his feet at bedtime.  Efudex  cream may cause irritation, if any irritation or discomfort may skip a few nights then restart     Wound Care Instructions for After Surgery  On the day following your surgery, you should begin doing daily dressing changes until your sutures are removed: Remove the bandage. Cleanse the wound gently with soap and water .  Make sure you then dry the skin surrounding the wound completely or the tape will not stick to the skin. Do not use cotton  balls on the wound. After the wound is clean and dry, apply the ointment (either prescription antibiotic prescribed by your doctor or plain Vaseline if nothing was prescribed) gently with a Q-tip. If you are using a bandaid to cover: Apply a bandaid large enough to cover the entire wound. If you do not have a bandaid large enough to cover the wound OR if you are sensitive to bandaid adhesive: Cut a non-stick pad (such as Telfa) to fit the size of the wound.  Cover the wound with the non-stick pad. If the wound is draining, you may want to add a small amount of gauze on top of the non-stick pad for a little added compression to the area. Use tape to seal the area completely.  For the next 1-2 weeks: Be sure to keep the wound moist with ointment 24/7 to ensure best healing. If you are unable to cover the wound with a bandage to hold the ointment in place, you may need to reapply the ointment several times a day. Do not bend over or lift heavy items to reduce the chance of elevated blood pressure to the wound. Do not participate in particularly strenuous activities.  Below is a list of dressing supplies you might need.  Cotton-tipped applicators - Q-tips Gauze pads (2x2 and/or 4x4) - All-Purpose Sponges New and clean tube of petroleum jelly (Vaseline) OR prescription antibiotic ointment if prescribed Either a bandaid large  enough to cover the entire wound OR non-stick dressing material (Telfa) and Tape (Paper or Hypafix)  FOR ADULT SURGERY PATIENTS: If you need something for pain relief, you may take 1 extra strength Tylenol  (acetaminophen ) and 2 ibuprofen (200 mg) together every 4 hours as needed. (Do not take these medications if you are allergic to them or if you know you cannot take them for any other reason). Typically you may only need pain medication for 1-3 days.   Comments on the Post-Operative Period Slight swelling and redness often appear around the wound. This is normal and will  disappear within several days following the surgery. The healing wound will drain a brownish-red-yellow discharge during healing. This is a normal phase of wound healing. As the wound begins to heal, the drainage may increase in amount. Again, this drainage is normal. Notify us  if the drainage becomes persistently bloody, excessively swollen, or intensely painful or develops a foul odor or red streaks.  The healing wound will also typically be itchy. This is normal. If you have severe or persistent pain, Notify us  if the discomfort is severe or persistent. Avoid alcoholic beverages when taking pain medicine.  In Case of Wound Hemorrhage A wound hemorrhage is when the bandage suddenly becomes soaked with bright red blood and flows profusely. If this happens, sit down or lie down with your head elevated. If the wound has a dressing on it, do not remove the dressing. Apply pressure to the existing gauze. If the wound is not covered, use a gauze pad to apply pressure and continue applying the pressure for 20 minutes without peeking. DO NOT COVER THE WOUND WITH A LARGE TOWEL OR WASH CLOTH. Release your hand from the wound site but do not remove the dressing. If the bleeding has stopped, gently clean around the wound. Leave the dressing in place for 24 hours if possible. This wait time allows the blood vessels to close off so that you do not spark a new round of bleeding by disrupting the newly clotted blood vessels with an immediate dressing change. If the bleeding does not subside, continue to hold pressure for 40 minutes. If bleeding continues, page your physician, contact an After Hours clinic or go to the Emergency Room.  Due to recent changes in healthcare laws, you may see results of your pathology and/or laboratory studies on MyChart before the doctors have had a chance to review them. We understand that in some cases there may be results that are confusing or concerning to you. Please understand that not  all results are received at the same time and often the doctors may need to interpret multiple results in order to provide you with the best plan of care or course of treatment. Therefore, we ask that you please give us  2 business days to thoroughly review all your results before contacting the office for clarification. Should we see a critical lab result, you will be contacted sooner.   If You Need Anything After Your Visit  If you have any questions or concerns for your doctor, please call our main line at [phone] and press option 4 to reach your doctor's medical assistant. If no one answers, please leave a voicemail as directed and we will return your call as soon as possible. Messages left after 4 pm will be answered the following business day.   You may also send us  a message via MyChart. We typically respond to MyChart messages within 1-2 business days.  For prescription refills, please  ask your pharmacy to contact our office. Our fax number is [phone].  If you have an urgent issue when the clinic is closed that cannot wait until the next business day, you can page your doctor at the number below.    Please note that while we do our best to be available for urgent issues outside of office hours, we are not available 24/7.   If you have an urgent issue and are unable to reach us , you may choose to seek medical care at your doctor's office, retail clinic, urgent care center, or emergency room.  If you have a medical emergency, please immediately call 911 or go to the emergency department.  Pager Numbers  - Dr. Bary Likes: [phone]  - Dr. Annette Barters: [phone]  - Dr. Felipe Horton: [phone]   In the event of inclement weather, please call our main line at [phone] for an update on the status of any delays or closures.  Dermatology Medication Tips: Please keep the boxes that topical medications come in in order to help keep track of the instructions about where and how to  use these. Pharmacies typically print the medication instructions only on the boxes and not directly on the medication tubes.   If your medication is too expensive, please contact our office at [phone] option 4 or send us  a message through MyChart.   We are unable to tell what your co-pay for medications will be in advance as this is different depending on your insurance coverage. However, we may be able to find a substitute medication at lower cost or fill out paperwork to get insurance to cover a needed medication.   If a prior authorization is required to get your medication covered by your insurance company, please allow us  1-2 business days to complete this process.  Drug prices often vary depending on where the prescription is filled and some pharmacies may offer cheaper prices.  The website www.goodrx.com contains coupons for medications through different pharmacies. The prices here do not account for what the cost may be with help from insurance (it may be cheaper with your insurance), but the website can give you the price if you did not use any insurance.  - You can print the associated coupon and take it with your prescription to the pharmacy.  - You may also stop by our office during regular business hours and pick up a GoodRx coupon card.  - If you need your prescription sent electronically to a different pharmacy, notify our office through [facility] or by phone at [phone] option 4.     Si Usted Necesita Algo Despus de Su Visita  Tambin puede enviarnos un mensaje a travs de Clinical cytogeneticist. Por lo general respondemos a los mensajes de MyChart en el transcurso de 1 a 2 das hbiles.  Para renovar recetas, por favor pida a su farmacia que se ponga en contacto con nuestra oficina. Franz Jacks de fax es [place] [phone].  Si tiene un asunto urgente cuando la clnica est cerrada y que no puede esperar hasta el siguiente da hbil, puede llamar/localizar a su  doctor(a) al nmero que aparece a continuacin.   Por favor, tenga en cuenta que aunque hacemos todo lo posible para estar disponibles para asuntos urgentes fuera del horario de [place], no estamos disponibles las 24 horas del da, los 7 [address]  [address] de la [place].   Si tiene un problema urgente y no puede comunicarse con nosotros, puede optar por buscar atencin mdica  en el consultorio de  su doctor(a), en una clnica privada, en un centro de atencin urgente o en una sala de emergencias.  Si tiene Engineer, drilling, por favor llame inmediatamente al 911 o vaya a la sala de emergencias.  Nmeros de bper  - Dr. Bary Likes: [phone]  - Dra. Annette Barters: [phone]  - Dr. Felipe Horton: [phone]   En caso de inclemencias del tiempo, por favor llame a Lajuan Pila principal al [phone] para una actualizacin sobre el [place] de cualquier retraso o cierre.  Consejos para la medicacin en dermatologa: Por favor, guarde las cajas en las que vienen los medicamentos de uso tpico para ayudarle a seguir las instrucciones sobre dnde y cmo usarlos. Las farmacias generalmente imprimen las instrucciones del medicamento slo en las cajas y no directamente en los tubos del [place].   Si su medicamento es muy caro, por favor, pngase en contacto con Bettyjane Brunet llamando al [phone] y presione la opcin 4 o envenos un mensaje a travs de Clinical cytogeneticist.   No podemos decirle cul ser su copago por los medicamentos por adelantado ya que esto es diferente dependiendo de la cobertura de su seguro. Sin embargo, es posible que podamos encontrar un medicamento sustituto a Audiological scientist un formulario para que el seguro cubra el medicamento que se considera necesario.   Si se requiere una autorizacin previa para que su compaa de seguros [place] su medicamento, por favor permtanos de 1 a 2 das hbiles para completar este proceso.  Los precios de los medicamentos varan con frecuencia dependiendo del  Environmental consultant de dnde se surte la receta y alguna farmacias pueden ofrecer precios ms baratos.  El sitio web www.goodrx.com tiene cupones para medicamentos de Health and safety inspector. Los precios aqu no tienen en cuenta lo que podra costar con la ayuda del seguro (puede ser ms barato con su seguro), pero el sitio web puede darle el precio si no utiliz Tourist information centre manager.  - Puede imprimir el cupn correspondiente y llevarlo con su receta a la farmacia.  - Tambin puede pasar por nuestra oficina durante el horario de atencin regular y Education officer, museum una tarjeta de cupones de GoodRx.  - Si necesita que su receta se enve electrnicamente a una farmacia diferente, informe a nuestra oficina a travs de MyChart de [place] o por telfono llamando al [phone] y presione la opcin 4.

## 2023-06-15 NOTE — Progress Notes (Signed)
Follow-Up Visit   Subjective  Christopher Bowers is a 58 y.o. male who presents for the following: Excision of cyst at left neck.  The patient has spots and lesions on his back and right foot to be evaluated.     The following portions of the chart were reviewed this encounter and updated as appropriate: medications, allergies, medical history  Review of Systems:  No other skin or systemic complaints except as noted in HPI or Assessment and Plan.  Objective  Well appearing patient in no apparent distress; mood and affect are within normal limits.  A focused examination was performed of the following areas: neck Relevant physical exam findings are noted in the Assessment and Plan.   Left Anterior Neck 0.5 cm subcutaneous papule  Assessment & Plan   OTHER VIRAL WARTS   SEBORRHEIC KERATOSES   EIC (EPIDERMAL INCLUSION CYST) Left Anterior Neck Skin excision - Left Anterior Neck  Total excision diameter (cm):  0.5 Informed consent: discussed and consent obtained   Timeout: patient name, date of birth, surgical site, and procedure verified   Procedure prep:  Patient was prepped and draped in usual sterile fashion Prep type:  Chlorhexidine Anesthesia: the lesion was anesthetized in a standard fashion   Anesthetic:  1% lidocaine w/ epinephrine 1-100,000 buffered w/ 8.4% NaHCO3 (3 cc lido w/epi) Instrument used: #15 blade   Hemostasis achieved with: pressure   Outcome: patient tolerated procedure well with no complications    Skin repair - Left Anterior Neck Complexity:  Intermediate Final length (cm):  1 Informed consent: discussed and consent obtained   Timeout: patient name, date of birth, surgical site, and procedure verified   Procedure prep:  Patient was prepped and draped in usual sterile fashion Prep type:  Chlorhexidine Anesthesia: the lesion was anesthetized in a standard fashion   Anesthetic:  1% lidocaine w/ epinephrine 1-100,000 buffered w/ 8.4% NaHCO3 Reason  for type of repair: reduce tension to allow closure, reduce the risk of dehiscence, infection, and necrosis, reduce subcutaneous dead space and avoid a hematoma, allow closure of the large defect and preserve normal anatomy   Undermining: edges could be approximated without difficulty   Subcutaneous layers (deep stitches):  Suture size:  4-0 Suture type: Monocryl (poliglecaprone 25)   Stitches:  Buried vertical mattress Fine/surface layer approximation (top stitches):  Suture removal (days):  0 Hemostasis achieved with: suture, pressure and electrodesiccation Outcome: patient tolerated procedure well with no complications   Post-procedure details: sterile dressing applied and wound care instructions given   Dressing type: petrolatum, bandage and pressure dressing   Additional details:  Superficial approximation with steri strips Specimen 1 - Surgical pathology Differential Diagnosis: cyst  Check Margins: No 0.5 cm subcutaneous papule   SEBORRHEIC KERATOSIS Back  - Stuck-on, waxy, tan-brown papules and/or plaques  - Benign-appearing - Discussed benign etiology and prognosis. - Observe - Call for any changes    WART Exam: verrucous papule on the right plantar foot   Counseling Discussed viral / HPV (Human Papilloma Virus) etiology and risk of spread /infectivity to other areas of body as well as to other people.  Multiple treatments and methods may be required to clear warts and it is possible treatment may not be successful.  Treatment risks include discoloration; scarring and there is still potential for wart recurrence.  Treatment Plan: Patient decline LN2  Start Efudex 5% cream apply to affected wart at bedtime  Start OTC Wartstick     Return if symptoms worsen or  fail to improve.  Anise Salvo, RMA, am acting as scribe for Elie Goody, MD .   Documentation: I have reviewed the above documentation for accuracy and completeness, and I agree with the  above.  Elie Goody, MD

## 2023-06-15 NOTE — Telephone Encounter (Signed)
 Advised pt to come for today's surgery at 11:30./sh

## 2023-06-16 LAB — SURGICAL PATHOLOGY

## 2023-06-17 ENCOUNTER — Ambulatory Visit (HOSPITAL_COMMUNITY): Payer: Commercial Managed Care - PPO

## 2023-06-20 ENCOUNTER — Telehealth: Payer: Self-pay

## 2023-06-20 NOTE — Telephone Encounter (Signed)
-----   Message from Lely sent at 06/16/2023  8:34 PM EST ----- Diagnosis left anterior neck :       EXCISION, EPIDERMOID CYST    Please call to share that excision removed benign cyst as expected and get update on surgical wound. Thank you.

## 2023-06-21 ENCOUNTER — Telehealth: Payer: Self-pay

## 2023-06-21 NOTE — Telephone Encounter (Signed)
Left voicemail returning patient's call about pathology results.

## 2023-06-25 ENCOUNTER — Other Ambulatory Visit: Payer: Self-pay | Admitting: Family Medicine

## 2023-06-25 ENCOUNTER — Other Ambulatory Visit: Payer: Self-pay | Admitting: Nurse Practitioner

## 2023-06-25 DIAGNOSIS — J4540 Moderate persistent asthma, uncomplicated: Secondary | ICD-10-CM

## 2023-06-27 ENCOUNTER — Other Ambulatory Visit (HOSPITAL_COMMUNITY): Payer: Self-pay

## 2023-06-27 ENCOUNTER — Other Ambulatory Visit: Payer: Self-pay

## 2023-06-27 MED ORDER — MOUNJARO 2.5 MG/0.5ML ~~LOC~~ SOAJ
2.5000 mg | SUBCUTANEOUS | 0 refills | Status: DC
  Filled 2023-06-27 – 2023-07-05 (×3): qty 2, 28d supply, fill #0

## 2023-07-04 ENCOUNTER — Ambulatory Visit (HOSPITAL_COMMUNITY)
Admission: RE | Admit: 2023-07-04 | Discharge: 2023-07-04 | Disposition: A | Discharge status: Home / Self Care | Payer: Commercial Managed Care - PPO | Source: Ambulatory Visit | Attending: Family Medicine | Admitting: Family Medicine

## 2023-07-04 DIAGNOSIS — K760 Fatty (change of) liver, not elsewhere classified: Secondary | ICD-10-CM | POA: Diagnosis not present

## 2023-07-05 ENCOUNTER — Other Ambulatory Visit: Payer: Self-pay | Admitting: Nurse Practitioner

## 2023-07-05 ENCOUNTER — Other Ambulatory Visit: Payer: Self-pay | Admitting: Family Medicine

## 2023-07-05 ENCOUNTER — Other Ambulatory Visit: Payer: Self-pay

## 2023-07-05 ENCOUNTER — Other Ambulatory Visit (HOSPITAL_COMMUNITY): Payer: Self-pay

## 2023-07-05 DIAGNOSIS — J4540 Moderate persistent asthma, uncomplicated: Secondary | ICD-10-CM

## 2023-07-05 DIAGNOSIS — K219 Gastro-esophageal reflux disease without esophagitis: Secondary | ICD-10-CM

## 2023-07-05 MED ORDER — ALBUTEROL SULFATE HFA 108 (90 BASE) MCG/ACT IN AERS
2.0000 | INHALATION_SPRAY | RESPIRATORY_TRACT | 1 refills | Status: DC | PRN
  Filled 2023-07-05: qty 6.7, 16d supply, fill #0
  Filled 2023-08-06 (×2): qty 6.7, 17d supply, fill #0
  Filled 2023-08-30: qty 6.7, 17d supply, fill #1

## 2023-07-05 MED ORDER — ESOMEPRAZOLE MAGNESIUM 40 MG PO CPDR
40.0000 mg | DELAYED_RELEASE_CAPSULE | Freq: Two times a day (BID) | ORAL | 1 refills | Status: DC
  Filled 2023-07-05: qty 180, 90d supply, fill #0
  Filled 2023-08-06 – 2023-09-29 (×3): qty 180, 90d supply, fill #1

## 2023-07-06 ENCOUNTER — Encounter: Payer: Self-pay | Admitting: Family Medicine

## 2023-07-08 ENCOUNTER — Other Ambulatory Visit (HOSPITAL_COMMUNITY): Payer: Self-pay

## 2023-07-11 ENCOUNTER — Telehealth: Payer: Self-pay | Admitting: Nurse Practitioner

## 2023-07-11 ENCOUNTER — Other Ambulatory Visit (HOSPITAL_COMMUNITY): Payer: Self-pay

## 2023-07-11 DIAGNOSIS — J4540 Moderate persistent asthma, uncomplicated: Secondary | ICD-10-CM

## 2023-07-11 MED ORDER — MONTELUKAST SODIUM 10 MG PO TABS
10.0000 mg | ORAL_TABLET | Freq: Every day | ORAL | 0 refills | Status: DC
  Filled 2023-07-11 (×2): qty 40, 40d supply, fill #0

## 2023-07-11 NOTE — Telephone Encounter (Signed)
 I called and spoke with pt. Pt stated he needed a refill on Singulair . I informed pt that I could send him a courtesy refill, but he must keep his appointment for anything further, Pt verbalized understanding. NFN

## 2023-07-11 NOTE — Telephone Encounter (Signed)
 PT needs Singulair  refill. OD for FU but I made an appt. His # is [phone]  Annis Kinder Long Out Pt.

## 2023-07-12 ENCOUNTER — Other Ambulatory Visit (HOSPITAL_COMMUNITY): Payer: Self-pay

## 2023-07-20 ENCOUNTER — Encounter: Payer: Self-pay | Admitting: Family Medicine

## 2023-08-06 ENCOUNTER — Other Ambulatory Visit: Payer: Self-pay | Admitting: Nurse Practitioner

## 2023-08-06 ENCOUNTER — Other Ambulatory Visit: Payer: Self-pay | Admitting: Family Medicine

## 2023-08-06 ENCOUNTER — Other Ambulatory Visit (HOSPITAL_COMMUNITY): Payer: Self-pay

## 2023-08-06 DIAGNOSIS — J4540 Moderate persistent asthma, uncomplicated: Secondary | ICD-10-CM

## 2023-08-06 DIAGNOSIS — I10 Essential (primary) hypertension: Secondary | ICD-10-CM

## 2023-08-08 ENCOUNTER — Other Ambulatory Visit (HOSPITAL_COMMUNITY): Payer: Self-pay

## 2023-08-08 ENCOUNTER — Other Ambulatory Visit: Payer: Self-pay

## 2023-08-08 ENCOUNTER — Other Ambulatory Visit: Payer: Self-pay | Admitting: Family Medicine

## 2023-08-08 MED ORDER — AMLODIPINE BESYLATE 5 MG PO TABS
5.0000 mg | ORAL_TABLET | Freq: Every day | ORAL | 1 refills | Status: DC
  Filled 2023-08-08: qty 90, 90d supply, fill #0
  Filled 2023-08-30 – 2023-10-25 (×3): qty 90, 90d supply, fill #1

## 2023-08-08 MED ORDER — LOSARTAN POTASSIUM 100 MG PO TABS
100.0000 mg | ORAL_TABLET | Freq: Every day | ORAL | 1 refills | Status: DC
  Filled 2023-08-08: qty 90, 90d supply, fill #0
  Filled 2023-08-30 – 2023-10-25 (×3): qty 90, 90d supply, fill #1

## 2023-08-08 MED ORDER — TIRZEPATIDE 5 MG/0.5ML ~~LOC~~ SOAJ
5.0000 mg | SUBCUTANEOUS | 0 refills | Status: DC
Start: 2023-08-08 — End: 2023-08-30
  Filled 2023-08-08: qty 2, 28d supply, fill #0

## 2023-08-08 MED ORDER — ROSUVASTATIN CALCIUM 10 MG PO TABS
10.0000 mg | ORAL_TABLET | Freq: Every day | ORAL | 1 refills | Status: DC
  Filled 2023-08-08: qty 90, 90d supply, fill #0
  Filled 2023-08-30 – 2023-10-25 (×3): qty 90, 90d supply, fill #1

## 2023-08-09 ENCOUNTER — Other Ambulatory Visit (HOSPITAL_COMMUNITY): Payer: Self-pay

## 2023-08-10 ENCOUNTER — Encounter (HOSPITAL_COMMUNITY): Payer: Self-pay

## 2023-08-10 ENCOUNTER — Other Ambulatory Visit (HOSPITAL_COMMUNITY): Payer: Self-pay

## 2023-08-17 ENCOUNTER — Encounter: Payer: Self-pay | Admitting: Nurse Practitioner

## 2023-08-17 ENCOUNTER — Other Ambulatory Visit: Payer: Self-pay

## 2023-08-17 ENCOUNTER — Ambulatory Visit: Payer: Commercial Managed Care - PPO | Admitting: Nurse Practitioner

## 2023-08-17 VITALS — BP 106/78 | HR 85 | Ht 71.0 in | Wt 267.6 lb

## 2023-08-17 DIAGNOSIS — J4540 Moderate persistent asthma, uncomplicated: Secondary | ICD-10-CM

## 2023-08-17 DIAGNOSIS — R058 Other specified cough: Secondary | ICD-10-CM | POA: Diagnosis not present

## 2023-08-17 DIAGNOSIS — J3089 Other allergic rhinitis: Secondary | ICD-10-CM

## 2023-08-17 MED ORDER — AZELASTINE HCL 0.1 % NA SOLN
2.0000 | Freq: Two times a day (BID) | NASAL | 5 refills | Status: AC
  Filled 2023-08-17: qty 30, 50d supply, fill #0
  Filled 2023-10-16: qty 30, 50d supply, fill #1
  Filled 2023-11-16 – 2023-12-01 (×2): qty 30, 50d supply, fill #2
  Filled 2024-01-20: qty 30, 50d supply, fill #3
  Filled 2024-04-01: qty 30, 50d supply, fill #4
  Filled 2024-06-10: qty 30, 50d supply, fill #5

## 2023-08-17 MED ORDER — MONTELUKAST SODIUM 10 MG PO TABS
10.0000 mg | ORAL_TABLET | Freq: Every day | ORAL | 11 refills | Status: AC
  Filled 2023-08-17: qty 30, 30d supply, fill #0
  Filled 2023-08-30 – 2023-09-15 (×2): qty 30, 30d supply, fill #1
  Filled 2023-09-29 – 2023-10-16 (×2): qty 30, 30d supply, fill #2
  Filled 2023-10-25 – 2023-11-16 (×2): qty 30, 30d supply, fill #3
  Filled 2023-12-14: qty 30, 30d supply, fill #4
  Filled 2024-01-10: qty 30, 30d supply, fill #5
  Filled 2024-02-09: qty 30, 30d supply, fill #6
  Filled 2024-03-09: qty 30, 30d supply, fill #7
  Filled 2024-04-06: qty 30, 30d supply, fill #8
  Filled 2024-04-30: qty 30, 30d supply, fill #9
  Filled 2024-06-05: qty 30, 30d supply, fill #10

## 2023-08-17 MED ORDER — BUDESONIDE-FORMOTEROL FUMARATE 80-4.5 MCG/ACT IN AERO
2.0000 | INHALATION_SPRAY | Freq: Two times a day (BID) | RESPIRATORY_TRACT | 6 refills | Status: DC
  Filled 2023-08-17 – 2023-08-30 (×2): qty 10.2, 30d supply, fill #0
  Filled 2023-09-29: qty 10.2, 30d supply, fill #1
  Filled 2023-10-25: qty 10.2, 30d supply, fill #2
  Filled 2023-11-16 – 2023-11-24 (×2): qty 10.2, 30d supply, fill #3
  Filled 2024-01-09: qty 10.2, 30d supply, fill #4
  Filled 2024-02-27: qty 10.2, 30d supply, fill #5
  Filled 2024-04-01: qty 10.2, 30d supply, fill #6

## 2023-08-17 NOTE — Progress Notes (Unsigned)
 @Patient  ID: Christopher Bowers, male    DOB: 1965/11/12, 58 y.o.   MRN: [id number]  Chief Complaint  Patient presents with   Follow-up    Patient is doing good.    Referring provider: Tommie Sams, DO  HPI: 58 year old male, never smoker followed for cough variant asthma and upper airway cough syndrome.  He is a patient of Dr. Thurston Hole and last seen in office on 05/18/2022 by [facility] NP.  Past medical history significant for hypertension, GERD, fatty liver, obesity, GAD, MDD, prediabetes, HLD.  TEST/EVENTS:  08/18/2020 PFTs: FVC 80, FEV1 90, ratio 87, TLC 98, DLCOunc 94.  No significant bronchodilator response; did have some mid flow reversibility 09/23/2021 CXR 1 view: Both lungs were clear.  There was no evidence of cardiopulmonary disease.  09/28/2021: OV with Karliah Kowalchuk NP for increased shortness of breath, wheezing and cough.  He also had some significant nasal congestion and drainage.  His symptoms started about a week ago.  He was seen by his PCP last Sunday via virtual visit and was prescribed an antibiotic and prednisone.  He started feeling worse last Wednesday and went to the ED.  Was treated with steroid injection, nebulizer treatment and discharged with prednisone burst for suspected asthma exacerbation.  Saw PCP again on Friday for hospital follow-up.  Reported that he was feeling relatively the same.  He was prescribed Levaquin course and extended prednisone taper.  Today, he reports feeling better.  Still has a slight increase in his DOE from his baseline and his cough has not entirely resolved; however he has had significant improvement in his symptoms.  Feels like Levaquin has made a big difference.  He was coughing up a little bit of blood and having some bloody nasal discharge last week but this has resolved.  He has not had any fevers, calf pain or swelling, chest pain.  He has been using albuterol neb twice a day.  He did restart using his Symbicort twice daily.  He does report that he does not  consistently use this every day.  Does have a history of significant allergy symptoms and takes Allegra. Seemed exacerbation was resolving with FeNO nl. Advised to complete levaquin and prednisone as previously prescribed. Discussed cough control and advised him on importance of compliance with maintenance inhaler therapy. Possible given his bloody nasal drainage that his hemoptysis was related to this; advised he use saline nasal gel at bedtime and back off on flonase if symptoms return.  11/13/2021: OV with Kacen Mellinger NP for follow-up.  He is doing significantly better.  Cough has entirely resolved and no more episodes of hemoptysis.  He was able to complete his antibiotics and steroids without any difficulties.  Previous x-rays were all clear.  He does have some occasional swelling in his lower extremities, which usually occurs when he is out in the heat or sitting for a long time.  His wife is worried about this so he went and saw his PCP earlier today regarding this, BNP was drawn but has not resulted yet.  He has no significant cardiac history.  Does take amlodipine for hypertension.  He continues on Symbicort twice daily.  Has not had to use his rescue inhaler.  Does still take Mucinex every now and then if he feels like he is getting a little congested.  Takes Allegra daily for allergies.  05/18/2022: OV with Timmy Cleverly NP for follow-up.  He has been stable since he was here last.  He has not had any  flares requiring any more antibiotics or steroids.  No hospitalizations.  He does still have daily symptoms of chest congestion and dyspnea.  Has an occasional cough but describes it more as needing to clear his throat.  He does take Nexium and Mucinex at night, which seem to help with this.  He has been using his albuterol twice daily, once in the morning and once in the evening.  He is only been using his Symbicort once a day.  Denies any fevers, chills, night sweats, orthopnea, PND, lower extremity swelling.  He takes  Equities trader for allergies which he feels are stable. Working on weight loss with Ozempic.   08/17/2023: Today - follow up Patient presents today for overdue follow up. He's been doing well for the most part. He did have a viral illness in November and developed bronchitis that he had to have steroids for. No abx. This was his only steroid course over the last year. Otherwise, he feels like his breathing has been doing pretty well. He does have some issues with postnasal drainage and mucus at night. Usually worse in allergy season. He's using flonase, mucinex, allegra and singulair. Sometimes has to use his albuterol, which helps clear things out. 2-3 times a week. He's on symbicort twice daily. No wheezing, fevers, chills, hemoptysis.   Allergies  Allergen Reactions   Lisinopril    Augmentin [Amoxicillin-Pot Clavulanate] Rash    Immunization History  Administered Date(s) Administered   Influenza Split 03/19/2013, 02/28/2014, 02/20/2018   Influenza,inj,Quad PF,6+ Mos 02/20/2018, 06/19/2020   Influenza-Unspecified 03/10/2015, 05/05/2016, 04/03/2019, 05/07/2021, 05/05/2022, 04/22/2023   Moderna Sars-Covid-2 Vaccination 02/28/2020, 03/17/2020   Pneumococcal Polysaccharide-23 05/09/2013    Past Medical History:  Diagnosis Date   Carpal tunnel syndrome of right wrist    GERD (gastroesophageal reflux disease)    History of acute respiratory failure    Feb 2014  w/  CAP and ARDS  ---  resolved   History of exercise stress test    01-07-2010   -- negative for ischemia, no chest pain,  hypertensive response to exercise   History of gastritis    History of hiatal hernia    Hypertension    OSA (obstructive sleep apnea)    per pt study done 2013 OSA intolerant cpap  ,  pt uses oral appliance   Seizure (HCC) 11/27/2020    Tobacco History: Social History   Tobacco Use  Smoking Status Never  Smokeless Tobacco Current   Types: Snuff   Last attempt to quit: 02/26/2015  Tobacco  Comments   "occasional snuff"   Ready to quit: Not Answered Counseling given: Not Answered Tobacco comments: "occasional snuff"   Outpatient Medications Prior to Visit  Medication Sig Dispense Refill   albuterol (PROVENTIL) (2.5 MG/3ML) 0.083% nebulizer solution Take 3 mLs (2.5 mg total) by nebulization every 6 (six) hours as needed for wheezing or shortness of breath. 75 mL 4   albuterol (VENTOLIN HFA) 108 (90 Base) MCG/ACT inhaler Inhale 2 puffs into the lungs every 4 (four) hours as needed for wheezing or shortness of breath 6.7 g 2   albuterol (VENTOLIN HFA) 108 (90 Base) MCG/ACT inhaler Inhale 2 puffs into the lungs every 4 (four) hours as needed for wheezing or shortness of breath. 6.7 g 1   amLODipine (NORVASC) 5 MG tablet Take 1 tablet (5 mg total) by mouth daily. 90 tablet 1   Cholecalciferol (VITAMIN D3) 5000 UNITS TABS Take 5,000 Units by mouth daily. (when he remembers)  clindamycin (CLEOCIN T) 1 % external solution Apply twice daily to affected areas on scalp as needed. 60 mL 2   esomeprazole (NEXIUM) 40 MG capsule Take 1 capsule (40 mg total) by mouth 2 (two) times daily before meals 180 capsule 1   fluorouracil (EFUDEX) 5 % cream Apply to affected area on the foot at bedtime 40 g 1   fluticasone (FLONASE) 50 MCG/ACT nasal spray Place 2 sprays into both nostrils daily. 16 g 1   ibuprofen (ADVIL) 200 MG tablet Take 200 mg by mouth every 6 (six) hours as needed.     losartan (COZAAR) 100 MG tablet Take 1 tablet (100 mg total) by mouth daily. 90 tablet 1   metFORMIN (GLUCOPHAGE) 500 MG tablet Take 1 tablet (500 mg total) by mouth 2 (two) times daily with a meal. 180 tablet 3   rosuvastatin (CRESTOR) 10 MG tablet Take 1 tablet (10 mg total) by mouth daily. 90 tablet 1   tirzepatide (MOUNJARO) 5 MG/0.5ML Pen Inject 5 mg into the skin once a week. 2 mL 0   budesonide-formoterol (SYMBICORT) 80-4.5 MCG/ACT inhaler Inhale 2 puffs into the lungs in the morning and at bedtime. 10.2 g 1    montelukast (SINGULAIR) 10 MG tablet Take 1 tablet (10 mg total) by mouth at bedtime. 40 tablet 0   No facility-administered medications prior to visit.     Review of Systems:   Constitutional: No weight loss or gain, night sweats, fevers, chills, fatigue, or lassitude. HEENT: No headaches, difficulty swallowing, tooth/dental problems, or sore throat. No sneezing, itching, ear ache + nasal congestion, post nasal drip CV:  + Intermittent swelling in lower extremities (chronic). No chest pain, orthopnea, PND, anasarca, dizziness, palpitations, syncope Resp: +shortness of breath with exertion (baseline); chest congestion in AM; occasional cough/throat clearing. No excess mucus or change in color of mucus.  No wheeze.  No hemoptysis.  No chest wall deformity GI:  +heartburn, indigestion (improves with Nexium). No abdominal pain, nausea, vomiting, diarrhea, change in bowel habits, loss of appetite, bloody stools.  Skin: No rash, lesions, ulcerations MSK:  No joint pain or swelling.  No decreased range of motion.  No back pain. Neuro: No dizziness or lightheadedness.  Psych: No depression or anxiety. Mood stable.     Physical Exam:  BP 106/78 (BP Location: Left Arm, Patient Position: Sitting)   Pulse 85   Ht 5\' 11"  (1.803 m)   Wt 267 lb 9.6 oz (121.4 kg)   SpO2 96%   BMI 37.32 kg/m   GEN: Pleasant, interactive, well-appearing; obese; in no acute distress. HEENT:  Normocephalic and atraumatic.  PERRLA. Sclera white. Nasal turbinates erythematous, moist and patent bilaterally.  No rhinorrhea present. Oropharynx pink and moist, without exudate or edema. No lesions, ulcerations  NECK:  Supple w/ fair ROM. No JVD present. Normal carotid impulses w/o bruits. Thyroid symmetrical with no goiter or nodules palpated. No lymphadenopathy.   CV: RRR, no m/r/g, no peripheral edema. Pulses intact, +2 bilaterally. No cyanosis, pallor or clubbing. PULMONARY:  Unlabored, regular breathing. Clear  bilaterally A&P w/o wheezes/rales/rhonchi. No accessory muscle use. No dullness to percussion. GI: BS present and normoactive. Soft, non-tender to palpation. No organomegaly or masses detected.  MSK: No erythema, warmth or tenderness. Cap refil <2 sec all extrem. No deformities or joint swelling noted.  Neuro: A/Ox3. No focal deficits noted.   Skin: Warm, no lesions or rashe Psych: Normal affect and behavior. Judgement and thought content appropriate.     Lab Results:  CBC    Component Value Date/Time   WBC 8.8 06/08/2023 0815   WBC 10.9 (H) 03/22/2017 1710   RBC 4.66 06/08/2023 0815   RBC 5.20 03/22/2017 1710   HGB 14.1 06/08/2023 0815   HCT 43.0 06/08/2023 0815   PLT 165 06/08/2023 0815   MCV 92 06/08/2023 0815   MCH 30.3 06/08/2023 0815   MCH 31.0 09/26/2014 0740   MCHC 32.8 06/08/2023 0815   MCHC 33.6 03/22/2017 1710   RDW 12.0 06/08/2023 0815   LYMPHSABS 1.8 11/11/2022 0842   MONOABS 1.0 03/22/2017 1710   EOSABS 0.1 11/11/2022 0842   BASOSABS 0.1 11/11/2022 0842    BMET    Component Value Date/Time   NA 142 06/08/2023 0815   K 4.9 06/08/2023 0815   CL 101 06/08/2023 0815   CO2 24 06/08/2023 0815   GLUCOSE 99 06/08/2023 0815   GLUCOSE 134 (H) 05/01/2015 1146   BUN 12 06/08/2023 0815   CREATININE 1.00 06/08/2023 0815   CREATININE 1.16 03/14/2013 1236   CALCIUM 9.9 06/08/2023 0815   GFRNONAA 72 07/04/2020 0821   GFRAA 83 07/04/2020 0821    BNP    Component Value Date/Time   BNP 16.7 11/13/2021 1447   BNP 17.0 09/26/2014 0740     Imaging:  No results found.  Administration History     None          Latest Ref Rng & Units 08/18/2020    3:44 PM  PFT Results  FVC-Pre L 4.04   FVC-Predicted Pre % 80   FVC-Post L 4.06   FVC-Predicted Post % 80   Pre FEV1/FVC % % 85   Post FEV1/FCV % % 87   FEV1-Pre L 3.44   FEV1-Predicted Pre % 88   FEV1-Post L 3.52   DLCO uncorrected ml/min/mmHg 27.45   DLCO UNC% % 94   DLVA Predicted % 112   TLC L 6.99    TLC % Predicted % 98   RV % Predicted % 129     Lab Results  Component Value Date   NITRICOXIDE 108 03/22/2017        Assessment & Plan:   Asthmatic bronchitis, moderate persistent, uncomplicated Compensated on current regimen. One exacerbation requiring steroids in the last year. Otherwise, feels his asthma is well controlled. Cough/congestion seems to be more upper airway in nature, described as globus sensation, and likely due to postnasal drainage from allergies. Target sinus symptoms with intranasal antihistamine and PRN chlortab. Can consider allergy referral if he continues to have difficulties. Action plan in place. Trigger prevention reviewed.   Patient Instructions  Continue Albuterol inhaler 2 puffs or 3 mL neb every 6 hours as needed for shortness of breath or wheezing. Notify if symptoms persist despite rescue inhaler/neb use. Continue Symbicort 2 puffs to Twice daily. Brush tongue and rinse mouth afterwards Continue Nexium 40 mg daily Continue flonase nasal spray 2 sprays each nostril Continue Allegra daily for allergies  Continue mucinex 600 mg Twice daily as needed for chest congestion/cough Continue singulair (montelukast) 1 tab At bedtime for allergies/asthma   Azelastine nasal spray 2 sprays each nostril Twice daily for congestion/allergies/mucus Chlorpheniramine 4 mg tab over the counter At bedtime as needed for drainage. May make you sleepy    Follow up in 1 year with Dr. Sherene Sires or Philis Nettle. If symptoms do not improve or worsen, please contact office for sooner follow up or seek emergency care.   Allergic rhinitis See above  Upper airway cough syndrome  See above     I spent 35 minutes of dedicated to the care of this patient on the date of this encounter to include pre-visit review of records, face-to-face time with the patient discussing conditions above, post visit ordering of testing, clinical documentation with the electronic health record,  making appropriate referrals as documented, and communicating necessary findings to members of the patients care team.  Noemi Chapel, NP 08/19/2023  Pt aware and understands NP's role.

## 2023-08-17 NOTE — Patient Instructions (Addendum)
 Continue Albuterol inhaler 2 puffs or 3 mL neb every 6 hours as needed for shortness of breath or wheezing. Notify if symptoms persist despite rescue inhaler/neb use. Continue Symbicort 2 puffs to Twice daily. Brush tongue and rinse mouth afterwards Continue Nexium 40 mg daily Continue flonase nasal spray 2 sprays each nostril Continue Allegra daily for allergies  Continue mucinex 600 mg Twice daily as needed for chest congestion/cough Continue singulair (montelukast) 1 tab At bedtime for allergies/asthma   Azelastine nasal spray 2 sprays each nostril Twice daily for congestion/allergies/mucus Chlorpheniramine 4 mg tab over the counter At bedtime as needed for drainage. May make you sleepy    Follow up in 1 year with Dr. Sherene Sires or Philis Nettle. If symptoms do not improve or worsen, please contact office for sooner follow up or seek emergency care.

## 2023-08-18 ENCOUNTER — Other Ambulatory Visit (HOSPITAL_COMMUNITY): Payer: Self-pay

## 2023-08-18 ENCOUNTER — Other Ambulatory Visit: Payer: Self-pay

## 2023-08-19 ENCOUNTER — Encounter: Payer: Self-pay | Admitting: Nurse Practitioner

## 2023-08-19 NOTE — Assessment & Plan Note (Signed)
 See above

## 2023-08-19 NOTE — Assessment & Plan Note (Signed)
 Compensated on current regimen. One exacerbation requiring steroids in the last year. Otherwise, feels his asthma is well controlled. Cough/congestion seems to be more upper airway in nature, described as globus sensation, and likely due to postnasal drainage from allergies. Target sinus symptoms with intranasal antihistamine and PRN chlortab. Can consider allergy referral if he continues to have difficulties. Action plan in place. Trigger prevention reviewed.   Patient Instructions  Continue Albuterol inhaler 2 puffs or 3 mL neb every 6 hours as needed for shortness of breath or wheezing. Notify if symptoms persist despite rescue inhaler/neb use. Continue Symbicort 2 puffs to Twice daily. Brush tongue and rinse mouth afterwards Continue Nexium 40 mg daily Continue flonase nasal spray 2 sprays each nostril Continue Allegra daily for allergies  Continue mucinex 600 mg Twice daily as needed for chest congestion/cough Continue singulair (montelukast) 1 tab At bedtime for allergies/asthma   Azelastine nasal spray 2 sprays each nostril Twice daily for congestion/allergies/mucus Chlorpheniramine 4 mg tab over the counter At bedtime as needed for drainage. May make you sleepy    Follow up in 1 year with Dr. Sherene Sires or Philis Nettle. If symptoms do not improve or worsen, please contact office for sooner follow up or seek emergency care.

## 2023-08-30 ENCOUNTER — Other Ambulatory Visit: Payer: Self-pay | Admitting: Dermatology

## 2023-08-30 ENCOUNTER — Other Ambulatory Visit: Payer: Self-pay

## 2023-08-30 ENCOUNTER — Other Ambulatory Visit (HOSPITAL_COMMUNITY): Payer: Self-pay

## 2023-08-30 ENCOUNTER — Other Ambulatory Visit: Payer: Self-pay | Admitting: Family Medicine

## 2023-08-30 MED ORDER — MOUNJARO 5 MG/0.5ML ~~LOC~~ SOAJ
5.0000 mg | SUBCUTANEOUS | 0 refills | Status: DC
  Filled 2023-08-30 – 2023-09-01 (×2): qty 2, 28d supply, fill #0

## 2023-08-30 MED ORDER — CLINDAMYCIN PHOSPHATE 1 % EX SOLN
CUTANEOUS | 2 refills | Status: DC
  Filled 2023-08-30: qty 60, 30d supply, fill #0
  Filled 2023-09-29: qty 60, 30d supply, fill #1
  Filled 2023-11-16: qty 60, 30d supply, fill #2

## 2023-09-01 ENCOUNTER — Other Ambulatory Visit: Payer: Self-pay

## 2023-09-15 ENCOUNTER — Other Ambulatory Visit (HOSPITAL_COMMUNITY): Payer: Self-pay

## 2023-09-15 ENCOUNTER — Other Ambulatory Visit: Payer: Self-pay

## 2023-09-15 ENCOUNTER — Other Ambulatory Visit: Payer: Self-pay | Admitting: Family Medicine

## 2023-09-16 ENCOUNTER — Other Ambulatory Visit: Payer: Self-pay

## 2023-09-16 ENCOUNTER — Other Ambulatory Visit (HOSPITAL_COMMUNITY): Payer: Self-pay

## 2023-09-16 MED ORDER — MOUNJARO 5 MG/0.5ML ~~LOC~~ SOAJ
5.0000 mg | SUBCUTANEOUS | 0 refills | Status: DC
  Filled 2023-09-16 – 2023-09-29 (×2): qty 2, 28d supply, fill #0

## 2023-09-29 ENCOUNTER — Other Ambulatory Visit: Payer: Self-pay

## 2023-09-29 ENCOUNTER — Other Ambulatory Visit (HOSPITAL_COMMUNITY): Payer: Self-pay

## 2023-09-29 ENCOUNTER — Other Ambulatory Visit: Payer: Self-pay | Admitting: Family Medicine

## 2023-09-30 ENCOUNTER — Other Ambulatory Visit: Payer: Self-pay

## 2023-09-30 ENCOUNTER — Other Ambulatory Visit (HOSPITAL_COMMUNITY): Payer: Self-pay

## 2023-09-30 MED ORDER — ALBUTEROL SULFATE HFA 108 (90 BASE) MCG/ACT IN AERS
2.0000 | INHALATION_SPRAY | RESPIRATORY_TRACT | 1 refills | Status: DC | PRN
  Filled 2023-09-30 – 2023-10-03 (×2): qty 6.7, 17d supply, fill #0
  Filled 2023-10-25: qty 6.7, 17d supply, fill #1

## 2023-09-30 MED ORDER — ALBUTEROL SULFATE HFA 108 (90 BASE) MCG/ACT IN AERS
2.0000 | INHALATION_SPRAY | RESPIRATORY_TRACT | 2 refills | Status: AC | PRN
Start: 2023-09-30 — End: ?
  Filled 2023-09-30 – 2023-11-16 (×2): qty 6.7, 17d supply, fill #0

## 2023-10-03 ENCOUNTER — Other Ambulatory Visit (HOSPITAL_COMMUNITY): Payer: Self-pay

## 2023-10-03 ENCOUNTER — Other Ambulatory Visit: Payer: Self-pay

## 2023-10-16 ENCOUNTER — Other Ambulatory Visit: Payer: Self-pay | Admitting: Family Medicine

## 2023-10-16 ENCOUNTER — Encounter: Payer: Self-pay | Admitting: Family Medicine

## 2023-10-17 ENCOUNTER — Other Ambulatory Visit (HOSPITAL_COMMUNITY): Payer: Self-pay

## 2023-10-17 ENCOUNTER — Other Ambulatory Visit: Payer: Self-pay

## 2023-10-17 MED ORDER — MOUNJARO 5 MG/0.5ML ~~LOC~~ SOAJ
5.0000 mg | SUBCUTANEOUS | 0 refills | Status: DC
  Filled 2023-10-17: qty 2, 28d supply, fill #0

## 2023-10-18 ENCOUNTER — Other Ambulatory Visit: Payer: Self-pay | Admitting: Family Medicine

## 2023-10-18 MED ORDER — TIRZEPATIDE 7.5 MG/0.5ML ~~LOC~~ SOAJ
7.5000 mg | SUBCUTANEOUS | 0 refills | Status: DC
  Filled 2023-10-18: qty 6, 84d supply, fill #0
  Filled 2023-10-25: qty 2, 28d supply, fill #0
  Filled 2023-11-16 – 2023-11-19 (×2): qty 2, 28d supply, fill #1

## 2023-10-19 ENCOUNTER — Other Ambulatory Visit (HOSPITAL_COMMUNITY): Payer: Self-pay

## 2023-10-25 ENCOUNTER — Other Ambulatory Visit: Payer: Self-pay

## 2023-10-25 ENCOUNTER — Other Ambulatory Visit (HOSPITAL_COMMUNITY): Payer: Self-pay

## 2023-10-25 ENCOUNTER — Other Ambulatory Visit: Payer: Self-pay | Admitting: Family Medicine

## 2023-10-25 DIAGNOSIS — K219 Gastro-esophageal reflux disease without esophagitis: Secondary | ICD-10-CM

## 2023-10-25 MED ORDER — ESOMEPRAZOLE MAGNESIUM 40 MG PO CPDR
40.0000 mg | DELAYED_RELEASE_CAPSULE | Freq: Two times a day (BID) | ORAL | 1 refills | Status: AC
  Filled 2023-10-25 – 2024-01-09 (×3): qty 180, 90d supply, fill #0
  Filled 2024-04-05: qty 180, 90d supply, fill #1

## 2023-10-26 ENCOUNTER — Other Ambulatory Visit: Payer: Self-pay

## 2023-10-26 ENCOUNTER — Other Ambulatory Visit (HOSPITAL_COMMUNITY): Payer: Self-pay

## 2023-10-27 ENCOUNTER — Other Ambulatory Visit: Payer: Self-pay

## 2023-10-28 ENCOUNTER — Other Ambulatory Visit: Payer: Self-pay | Admitting: Family Medicine

## 2023-10-28 ENCOUNTER — Other Ambulatory Visit (HOSPITAL_COMMUNITY): Payer: Self-pay

## 2023-10-28 MED ORDER — ROSUVASTATIN CALCIUM 10 MG PO TABS
10.0000 mg | ORAL_TABLET | Freq: Every day | ORAL | 1 refills | Status: AC
  Filled 2023-10-28 – 2024-01-30 (×3): qty 90, 90d supply, fill #0
  Filled 2024-04-30: qty 90, 90d supply, fill #1

## 2023-11-16 ENCOUNTER — Other Ambulatory Visit (HOSPITAL_COMMUNITY): Payer: Self-pay

## 2023-11-16 ENCOUNTER — Other Ambulatory Visit: Payer: Self-pay | Admitting: Family Medicine

## 2023-11-16 ENCOUNTER — Other Ambulatory Visit: Payer: Self-pay

## 2023-11-16 DIAGNOSIS — I10 Essential (primary) hypertension: Secondary | ICD-10-CM

## 2023-11-16 MED ORDER — LOSARTAN POTASSIUM 100 MG PO TABS
100.0000 mg | ORAL_TABLET | Freq: Every day | ORAL | 1 refills | Status: AC
  Filled 2023-11-16 – 2024-01-30 (×3): qty 90, 90d supply, fill #0
  Filled 2024-04-30: qty 90, 90d supply, fill #1

## 2023-11-16 MED ORDER — METFORMIN HCL 500 MG PO TABS
500.0000 mg | ORAL_TABLET | Freq: Two times a day (BID) | ORAL | 3 refills | Status: AC
  Filled 2023-11-16 – 2024-01-23 (×3): qty 180, 90d supply, fill #0
  Filled 2024-04-18: qty 180, 90d supply, fill #1

## 2023-11-16 MED ORDER — AMLODIPINE BESYLATE 5 MG PO TABS
5.0000 mg | ORAL_TABLET | Freq: Every day | ORAL | 1 refills | Status: AC
  Filled 2023-11-16 – 2024-01-30 (×2): qty 90, 90d supply, fill #0
  Filled 2024-04-30: qty 90, 90d supply, fill #1

## 2023-11-17 ENCOUNTER — Other Ambulatory Visit (HOSPITAL_COMMUNITY): Payer: Self-pay

## 2023-11-19 ENCOUNTER — Other Ambulatory Visit (HOSPITAL_COMMUNITY): Payer: Self-pay

## 2023-11-19 ENCOUNTER — Other Ambulatory Visit (HOSPITAL_BASED_OUTPATIENT_CLINIC_OR_DEPARTMENT_OTHER): Payer: Self-pay

## 2023-11-21 ENCOUNTER — Other Ambulatory Visit: Payer: Self-pay

## 2023-11-24 ENCOUNTER — Other Ambulatory Visit (HOSPITAL_COMMUNITY): Payer: Self-pay

## 2023-11-29 ENCOUNTER — Other Ambulatory Visit: Payer: Self-pay | Admitting: Family Medicine

## 2023-11-29 ENCOUNTER — Telehealth: Payer: Self-pay

## 2023-11-29 DIAGNOSIS — Z125 Encounter for screening for malignant neoplasm of prostate: Secondary | ICD-10-CM

## 2023-11-29 DIAGNOSIS — E7800 Pure hypercholesterolemia, unspecified: Secondary | ICD-10-CM

## 2023-11-29 DIAGNOSIS — Z130 Encounter for screening for diseases of the blood and blood-forming organs and certain disorders involving the immune mechanism: Secondary | ICD-10-CM

## 2023-11-29 DIAGNOSIS — E119 Type 2 diabetes mellitus without complications: Secondary | ICD-10-CM

## 2023-11-29 NOTE — Telephone Encounter (Signed)
 Communication  Reason for CRM: Pt calling to request full blood work. Orders will need to be input and pt will need a call to get scheduled. Callback number is [phone].

## 2023-12-01 ENCOUNTER — Other Ambulatory Visit (HOSPITAL_COMMUNITY): Payer: Self-pay

## 2023-12-01 ENCOUNTER — Telehealth: Payer: Self-pay

## 2023-12-01 NOTE — Telephone Encounter (Signed)
 Communication  Reason for CRM: pt is requesting a lab order to be put in so that he can come get them done prior to appt in [place] [facility]

## 2023-12-05 ENCOUNTER — Ambulatory Visit

## 2023-12-08 ENCOUNTER — Ambulatory Visit: Payer: Commercial Managed Care - PPO | Admitting: Family Medicine

## 2023-12-08 DIAGNOSIS — E7800 Pure hypercholesterolemia, unspecified: Secondary | ICD-10-CM | POA: Diagnosis not present

## 2023-12-08 DIAGNOSIS — Z130 Encounter for screening for diseases of the blood and blood-forming organs and certain disorders involving the immune mechanism: Secondary | ICD-10-CM | POA: Diagnosis not present

## 2023-12-08 DIAGNOSIS — E119 Type 2 diabetes mellitus without complications: Secondary | ICD-10-CM | POA: Diagnosis not present

## 2023-12-08 DIAGNOSIS — Z125 Encounter for screening for malignant neoplasm of prostate: Secondary | ICD-10-CM | POA: Diagnosis not present

## 2023-12-09 ENCOUNTER — Ambulatory Visit: Payer: Self-pay | Admitting: Family Medicine

## 2023-12-09 LAB — CMP14+EGFR
ALT: 92 IU/L — ABNORMAL HIGH (ref 0–44)
AST: 79 IU/L — ABNORMAL HIGH (ref 0–40)
Albumin: 4.9 g/dL (ref 3.8–4.9)
Alkaline Phosphatase: 78 IU/L (ref 44–121)
BUN/Creatinine Ratio: 13 (ref 9–20)
BUN: 12 mg/dL (ref 6–24)
Bilirubin Total: 0.5 mg/dL (ref 0.0–1.2)
CO2: 21 mmol/L (ref 20–29)
Calcium: 9.6 mg/dL (ref 8.7–10.2)
Chloride: 102 mmol/L (ref 96–106)
Creatinine, Ser: 0.94 mg/dL (ref 0.76–1.27)
Globulin, Total: 2.1 g/dL (ref 1.5–4.5)
Glucose: 105 mg/dL — ABNORMAL HIGH (ref 70–99)
Potassium: 5.1 mmol/L (ref 3.5–5.2)
Sodium: 141 mmol/L (ref 134–144)
Total Protein: 7 g/dL (ref 6.0–8.5)
eGFR: 94 mL/min/1.73 (ref >59)

## 2023-12-09 LAB — HEMOGLOBIN A1C
Est. average glucose Bld gHb Est-mCnc: 131 mg/dL
Hgb A1c MFr Bld: 6.2 % — ABNORMAL HIGH (ref 4.8–5.6)

## 2023-12-09 LAB — LIPID PANEL
Chol/HDL Ratio: 2.6 ratio (ref 0.0–5.0)
Cholesterol, Total: 148 mg/dL (ref 100–199)
HDL: 57 mg/dL (ref >39)
LDL Chol Calc (NIH): 78 mg/dL (ref 0–99)
Triglycerides: 64 mg/dL (ref 0–149)
VLDL Cholesterol Cal: 13 mg/dL (ref 5–40)

## 2023-12-09 LAB — CBC
Hematocrit: 45.4 % (ref 37.5–51.0)
Hemoglobin: 14.8 g/dL (ref 13.0–17.7)
MCH: 31 pg (ref 26.6–33.0)
MCHC: 32.6 g/dL (ref 31.5–35.7)
MCV: 95 fL (ref 79–97)
Platelets: 144 x10E3/uL — ABNORMAL LOW (ref 150–450)
RBC: 4.77 x10E6/uL (ref 4.14–5.80)
RDW: 13.2 % (ref 11.6–15.4)
WBC: 8.5 x10E3/uL (ref 3.4–10.8)

## 2023-12-09 LAB — PSA: Prostate Specific Ag, Serum: 0.9 ng/mL (ref 0.0–4.0)

## 2023-12-09 LAB — MICROALBUMIN / CREATININE URINE RATIO
Creatinine, Urine: 87.9 mg/dL
Microalb/Creat Ratio: 8 mg/g{creat} (ref 0–29)
Microalbumin, Urine: 7.1 ug/mL

## 2023-12-12 ENCOUNTER — Other Ambulatory Visit: Payer: Self-pay

## 2023-12-12 DIAGNOSIS — K760 Fatty (change of) liver, not elsewhere classified: Secondary | ICD-10-CM

## 2023-12-13 ENCOUNTER — Ambulatory Visit: Admitting: Family Medicine

## 2023-12-13 ENCOUNTER — Other Ambulatory Visit (HOSPITAL_COMMUNITY): Payer: Self-pay

## 2023-12-13 VITALS — BP 124/82 | HR 79 | Temp 97.9°F | Ht 71.0 in | Wt 264.0 lb

## 2023-12-13 DIAGNOSIS — I10 Essential (primary) hypertension: Secondary | ICD-10-CM

## 2023-12-13 DIAGNOSIS — E119 Type 2 diabetes mellitus without complications: Secondary | ICD-10-CM | POA: Diagnosis not present

## 2023-12-13 DIAGNOSIS — R0609 Other forms of dyspnea: Secondary | ICD-10-CM

## 2023-12-13 DIAGNOSIS — K760 Fatty (change of) liver, not elsewhere classified: Secondary | ICD-10-CM

## 2023-12-13 MED ORDER — TIRZEPATIDE 10 MG/0.5ML ~~LOC~~ SOAJ
10.0000 mg | SUBCUTANEOUS | 1 refills | Status: DC
  Filled 2023-12-13 – 2023-12-14 (×2): qty 2, 28d supply, fill #0
  Filled 2024-01-09: qty 2, 28d supply, fill #1
  Filled 2024-01-30 – 2024-02-01 (×2): qty 2, 28d supply, fill #2

## 2023-12-13 NOTE — Assessment & Plan Note (Addendum)
 A1c at goal.  Patient is interested in increasing Mounjaro .  I advised the patient that insurance can pose problems with increasing Mounjaro  despite good control.  He wants to try and proceed to help promote weight loss.  10 mg dose sent in.  Continue metformin .

## 2023-12-13 NOTE — Assessment & Plan Note (Signed)
 After discussion with the patient today, he elected to proceed with cardiology referral for consultation for stress test.

## 2023-12-13 NOTE — Progress Notes (Signed)
 Subjective:  Patient ID: Christopher Bowers, male    DOB: [birth year]  Age: 58 y.o. MRN: [id number]  CC:   6 month follow up  HPI:  58 year old male with the below mentioned medical problems presents for follow-up.  Blood pressure is well-controlled here on amlodipine  and losartan .  He does report that he has some lower extremity edema which improves with elevation.  Patient reports that he has some dyspnea on exertion. He was able to golf recently for a few days even in the heat without a lot of difficulty.  He is concerned about potential underlying cardiac issue and his wife is concerned as well.  Patient's labs recently done on 7/10.  Labs reviewed today.  Persistently elevated LFTs.  Noted to have mild thrombocytopenia at 144.  A1c at goal.  A1c was 6.2 and was improved from prior.  He is compliant with Mounjaro  7.5 mg weekly and metformin .  Patient reports that he is concerned about his weight.  Patient states that he drinks alcohol regularly.  Will discuss today.  Patient Active Problem List   Diagnosis Date Noted   DOE (dyspnea on exertion) 12/13/2023   Metabolic dysfunction-associated steatotic liver disease (MASLD) 06/10/2023   Type 2 diabetes mellitus (HCC) 11/16/2021   Upper airway cough syndrome 09/28/2021   Allergic rhinitis 09/28/2021   Hyperlipidemia 05/11/2021   GERD (gastroesophageal reflux disease) 05/11/2021   Major depressive disorder with single episode, in remission (HCC) 01/08/2020   Asthmatic bronchitis, moderate persistent, uncomplicated 03/22/2017   Generalized anxiety disorder 02/05/2017   Obstructive sleep apnea 07/21/2016   Obesity 07/08/2012   Essential hypertension 07/08/2012    Social Hx   Social History   Socioeconomic History   Marital status: Married    Spouse name: Not on file   Number of children: 1   Years of education: college   Highest education level: Not on file  Occupational History   Occupation: Investment banker, corporate: [employer]     Comment: truck  Tobacco Use   Smoking status: Never   Smokeless tobacco: Current    Types: Snuff    Last attempt to quit: 02/26/2015   Tobacco comments:    occasional snuff  Vaping Use   Vaping status: Never Used  Substance and Sexual Activity   Alcohol use: Yes    Alcohol/week: 5.0 standard drinks of alcohol    Types: 5 Shots of liquor per week    Comment: 5-6 liquor drinks per day   Drug use: No   Sexual activity: Not on file  Other Topics Concern   Not on file  Social History Narrative   Patient drinks about 3 cups of caffeine daily.   Patient is right handed.   Social Drivers of Corporate investment banker Strain: Not on file  Food Insecurity: Not on file  Transportation Needs: Not on file  Physical Activity: Not on file  Stress: Not on file  Social Connections: Not on file    Review of Systems Per HPI  Objective:  BP 124/82   Pulse 79   Temp 97.9 F (36.6 C)   Ht 5' 11 (1.803 m)   Wt 264 lb (119.7 kg)   SpO2 95%   BMI 36.82 kg/m      12/13/2023   10:40 AM 08/17/2023    4:05 PM 06/10/2023    8:28 AM  BP/Weight  Systolic BP 124 106 125  Diastolic BP 82 78 82  Wt. (Lbs) 264  267.6 268.4  BMI 36.82 kg/m2 37.32 kg/m2 37.43 kg/m2    Physical Exam Constitutional:      General: He is not in acute distress.    Appearance: Normal appearance. He is obese.  HENT:     Head: Normocephalic and atraumatic.  Eyes:     General:        Right eye: No discharge.        Left eye: No discharge.     Conjunctiva/sclera: Conjunctivae normal.  Cardiovascular:     Rate and Rhythm: Normal rate and regular rhythm.  Pulmonary:     Effort: Pulmonary effort is normal.     Breath sounds: Normal breath sounds. No wheezing, rhonchi or rales.  Neurological:     Mental Status: He is alert.  Psychiatric:        Mood and Affect: Mood normal.        Behavior: Behavior normal.     Lab Results  Component Value Date   WBC 8.5 12/08/2023   HGB 14.8 12/08/2023   HCT  45.4 12/08/2023   PLT 144 (L) 12/08/2023   GLUCOSE 105 (H) 12/08/2023   CHOL 148 12/08/2023   TRIG 64 12/08/2023   HDL 57 12/08/2023   LDLCALC 78 12/08/2023   ALT 92 (H) 12/08/2023   AST 79 (H) 12/08/2023   NA 141 12/08/2023   K 5.1 12/08/2023   CL 102 12/08/2023   CREATININE 0.94 12/08/2023   BUN 12 12/08/2023   CO2 21 12/08/2023   TSH 6.190 (H) 08/02/2013   HGBA1C 6.2 (H) 12/08/2023     Assessment & Plan:  DOE (dyspnea on exertion) Assessment & Plan: After discussion with the patient today, he elected to proceed with cardiology referral for consultation for stress test.  Orders: -     Ambulatory referral to Cardiology  Type 2 diabetes mellitus without complication, without long-term current use of insulin  (HCC) Assessment & Plan: A1c at goal.  Patient is interested in increasing Mounjaro .  I advised the patient that insurance can pose problems with increasing Mounjaro  despite good control.  He wants to try and proceed to help promote weight loss.  10 mg dose sent in.  Continue metformin .  Orders: -     Tirzepatide ; Inject 10 mg into the skin once a week.  Dispense: 6 mL; Refill: 1  Essential hypertension Assessment & Plan: BP stable on amlodipine  and losartan .  Continue.   Metabolic dysfunction-associated steatotic liver disease (MASLD) Assessment & Plan: Recommended decreasing alcohol intake and preferably avoiding alcohol intake altogether.  Discussed seeing GI.  We will wait at this time and monitor his thrombocytopenia.  Has had a recent ultrasound which did not show any fibrosis.     Follow-up: 6 months  Mitcheal Sweetin Bluford DO [facility]

## 2023-12-13 NOTE — Assessment & Plan Note (Signed)
 Recommended decreasing alcohol intake and preferably avoiding alcohol intake altogether.  Discussed seeing GI.  We will wait at this time and monitor his thrombocytopenia.  Has had a recent ultrasound which did not show any fibrosis.

## 2023-12-13 NOTE — Patient Instructions (Signed)
 Mounjaro  increased.  Referral to cardiology placed.  Watch alcohol consumption.  Follow up in 6 months.

## 2023-12-13 NOTE — Assessment & Plan Note (Signed)
 BP stable on amlodipine  and losartan .  Continue.

## 2023-12-14 ENCOUNTER — Other Ambulatory Visit (HOSPITAL_COMMUNITY): Payer: Self-pay

## 2023-12-14 ENCOUNTER — Other Ambulatory Visit: Payer: Self-pay

## 2023-12-21 ENCOUNTER — Ambulatory Visit

## 2023-12-22 ENCOUNTER — Telehealth: Payer: Self-pay | Admitting: Family Medicine

## 2023-12-22 ENCOUNTER — Ambulatory Visit

## 2023-12-22 NOTE — Telephone Encounter (Unsigned)
 Copied from CRM #[id number]. Topic: Appointments - Scheduling Inquiry for Clinic >> Dec 22, 2023 10:07 AM Tiffini S wrote: Reason for CRM: Patient called about a missed appointment for 12/22/23 at 08:20 AM- DIABETIC RETINAL EXAM/ please call the patient to reschedule the appointment.

## 2023-12-26 ENCOUNTER — Encounter: Payer: Self-pay | Admitting: Family Medicine

## 2024-01-02 ENCOUNTER — Encounter: Payer: Self-pay | Admitting: Internal Medicine

## 2024-01-09 ENCOUNTER — Other Ambulatory Visit (HOSPITAL_COMMUNITY): Payer: Self-pay

## 2024-01-09 ENCOUNTER — Other Ambulatory Visit: Payer: Self-pay

## 2024-01-10 ENCOUNTER — Other Ambulatory Visit (HOSPITAL_COMMUNITY): Payer: Self-pay

## 2024-01-10 ENCOUNTER — Other Ambulatory Visit: Payer: Self-pay

## 2024-01-19 ENCOUNTER — Encounter: Payer: Self-pay | Admitting: Internal Medicine

## 2024-01-19 ENCOUNTER — Ambulatory Visit (INDEPENDENT_AMBULATORY_CARE_PROVIDER_SITE_OTHER): Admitting: Internal Medicine

## 2024-01-19 VITALS — BP 124/84 | HR 81 | Temp 97.3°F | Ht 71.0 in | Wt 269.6 lb

## 2024-01-19 DIAGNOSIS — K760 Fatty (change of) liver, not elsewhere classified: Secondary | ICD-10-CM | POA: Diagnosis not present

## 2024-01-19 DIAGNOSIS — K219 Gastro-esophageal reflux disease without esophagitis: Secondary | ICD-10-CM | POA: Diagnosis not present

## 2024-01-19 DIAGNOSIS — R7989 Other specified abnormal findings of blood chemistry: Secondary | ICD-10-CM | POA: Diagnosis not present

## 2024-01-19 DIAGNOSIS — K7581 Nonalcoholic steatohepatitis (NASH): Secondary | ICD-10-CM

## 2024-01-19 DIAGNOSIS — Z1211 Encounter for screening for malignant neoplasm of colon: Secondary | ICD-10-CM

## 2024-01-19 NOTE — Progress Notes (Signed)
 Primary Care Physician:  Cook, Jayce G, DO Primary Gastroenterologist:  Dr. Cindie  Chief Complaint  Patient presents with   fatty liver    Referred by Dr. Bluford    HPI:   Christopher Bowers is a 58 y.o. male who presents to clinic today by referral from his PCP Dr. Bluford for evaluation.  Elevated LFTs, hepatic steatosis: Patient has had elevated aminotransferases going back to at least February 2022.  Most recently blood work 12/08/2023 with AST 79, ALT 92, alk phos 78, T. bili 0.5, albumin 4.9, platelets 144.  RUQ US  w/ elastography 07/04/2023 with hepatic steatosis, median kPa of 2.1 though reduced accuracy.  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  Also notes daily alcohol use of 2 mixed drinks a day.  Denies any herbal supplements including ashwagandha, green tea extract, turmeric.  Currently on a GLP-1 as well as rosuvastatin .  Denies any exposure to viral hepatitis that he is aware of.  No illicit drug use.  Chronic GERD: Currently taking esomeprazole  twice daily with good control.  No dysphagia odynophagia.  No epigastric or chest pain.  Colon cancer screening: Colonoscopy 01/11/2019 with 4 polyps removed.  All hyperplastic on pathology.  10-year recall.  Past Medical History:  Diagnosis Date   Carpal tunnel syndrome of right wrist    GERD (gastroesophageal reflux disease)    History of acute respiratory failure    Feb 2014  w/  CAP and ARDS  ---  resolved   History of exercise stress test    01-07-2010   -- negative for ischemia, no chest pain,  hypertensive response to exercise   History of gastritis    History of hiatal hernia    Hypertension    OSA (obstructive sleep apnea)    per pt study done 2013 OSA intolerant cpap  ,  pt uses oral appliance   Seizure (HCC) 11/27/2020    Past Surgical History:  Procedure Laterality Date   CARPAL TUNNEL RELEASE Right 05/01/2015   Procedure: RIGHT CARPAL TUNNEL RELEASE;  Surgeon: Prentice Pagan, MD;  Location: [facility];  Service: Orthopedics;  Laterality: Right;  ANESTHESIA: LOCAL/IV SEDATION   COLONOSCOPY WITH PROPOFOL  N/A 01/11/2019   Procedure: COLONOSCOPY WITH PROPOFOL ;  Surgeon: Shaaron Lamar HERO, MD;  [facility];  Service: Endoscopy;  Laterality: N/A;  9:15am   POLYPECTOMY  01/11/2019   Procedure: POLYPECTOMY;  Surgeon: Shaaron Lamar HERO, MD;  [facility];  Service: Endoscopy;;  colon   TONSILLECTOMY  age 59   TRANSTHORACIC ECHOCARDIOGRAM  07-11-2012   LVSF  45-55%    Current Outpatient Medications  Medication Sig Dispense Refill   albuterol  (PROVENTIL ) (2.5 MG/3ML) 0.083% nebulizer solution Take 3 mLs (2.5 mg total) by nebulization every 6 (six) hours as needed for wheezing or shortness of breath. 75 mL 4   albuterol  (VENTOLIN  HFA) 108 (90 Base) MCG/ACT inhaler Inhale 2 puffs into the lungs every 4 (four) hours as needed for wheezing or shortness of breath 6.7 g 2   amLODipine  (NORVASC ) 5 MG tablet Take 1 tablet (5 mg total) by mouth daily. 90 tablet 1   azelastine  (ASTELIN ) 0.1 % nasal spray Place 2 sprays into both nostrils 2 (two) times daily. Use in each nostril as directed 30 mL 5   budesonide -formoterol  (SYMBICORT ) 80-4.5 MCG/ACT inhaler Inhale 2 puffs into the lungs in the morning and at bedtime. 10.2 g 6   Cholecalciferol (VITAMIN D3) 5000 UNITS  TABS Take 5,000 Units by mouth daily. (when he remembers)     esomeprazole  (NEXIUM ) 40 MG capsule Take 1 capsule (40 mg total) by mouth 2 (two) times daily before meals 180 capsule 1   fluorouracil  (EFUDEX ) 5 % cream Apply to affected area on the foot at bedtime 40 g 1   fluticasone  (FLONASE ) 50 MCG/ACT nasal spray Place 2 sprays into both nostrils daily. 16 g 1   ibuprofen (ADVIL) 200 MG tablet Take 200 mg by mouth every 6 (six) hours as needed.     losartan  (COZAAR ) 100 MG tablet Take 1 tablet (100 mg total) by mouth daily. 90 tablet 1   metFORMIN  (GLUCOPHAGE ) 500 MG tablet Take 1  tablet (500 mg total) by mouth 2 (two) times daily with a meal. 180 tablet 3   montelukast  (SINGULAIR ) 10 MG tablet Take 1 tablet (10 mg total) by mouth at bedtime. 30 tablet 11   rosuvastatin  (CRESTOR ) 10 MG tablet Take 1 tablet (10 mg total) by mouth daily. 90 tablet 1   tirzepatide  (MOUNJARO ) 10 MG/0.5ML Pen Inject 10 mg into the skin once a week. 6 mL 1   No current facility-administered medications for this visit.    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   Patient is right handed.   Social Drivers of Corporate investment banker Strain: Not on file  Food Insecurity: Not on file  Transportation Needs: Not on file  Physical Activity: Not on file  Stress: Not on file  Social Connections: Not on file  Intimate Partner Violence: Not on file    Subjective: Review of Systems  Constitutional:  Negative for  chills and fever.  HENT:  Negative for congestion and hearing loss.   Eyes:  Negative for blurred vision and double vision.  Respiratory:  Negative for cough and shortness of breath.   Cardiovascular:  Negative for chest pain and palpitations.  Gastrointestinal:  Negative for abdominal pain, blood in stool, constipation, diarrhea, heartburn, melena and vomiting.  Genitourinary:  Negative for dysuria and urgency.  Musculoskeletal:  Negative for joint pain and myalgias.  Skin:  Negative for itching and rash.  Neurological:  Negative for dizziness and headaches.  Psychiatric/Behavioral:  Negative for depression. The patient is not nervous/anxious.        Objective: BP 124/84   Pulse 81   Temp (!) 97.3 F (36.3 C)   Ht 5' 11 (1.803 m)   Wt 269 lb 9.6 oz (122.3 kg)   BMI 37.60 kg/m  Physical Exam Constitutional:      Appearance: Normal appearance.  HENT:     Head: Normocephalic and atraumatic.  Eyes:     Extraocular Movements: Extraocular movements intact.     Conjunctiva/sclera: Conjunctivae normal.  Cardiovascular:     Rate and Rhythm: Normal rate and regular rhythm.  Pulmonary:     Effort: Pulmonary effort is normal.     Breath sounds: Normal breath sounds.  Abdominal:     General: Bowel sounds are normal.     Palpations: Abdomen is soft.  Musculoskeletal:        General: Normal range of motion.     Cervical back: Normal range of motion and neck supple.  Skin:    General: Skin is warm.  Neurological:     General: No focal deficit present.     Mental Status: He is alert and oriented to person, place, and time.  Psychiatric:        Mood and Affect: Mood normal.        Behavior: Behavior normal.      Assessment/Plan:  1.  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  Also may have a component of alcohol induced liver disease drinking 2 mixed drinks daily.   Fibrosis 4 Score = 3.32 (High  risk)    RUQ US  w/ elastography 07/04/2023 with hepatic steatosis, median kPa of 2.1 though reduced accuracy.  Will order FibroSure testing today.  Will perform further serological workup in regards to his elevated LFTs.  Call with results.  Continue on GLP-1.  Counseled on importance of keeping diabetes, hypertension, dyslipidemia under good control.  Recommend 1-2# weight loss per week until ideal body weight through exercise & diet. Low fat/cholesterol diet.   Avoid sweets, sodas, fruit juices, sweetened beverages like tea, etc. Gradually increase exercise from 15 min daily up to 1 hr per day 5 days/week. Limit alcohol use.  2.  Chronic GERD-well-controlled on esomeprazole , will continue.  3.  Colon cancer screening-colonoscopy recall 2030  Follow-up in 3 months.  Thank you Dr. Bluford for the kind referral.  01/19/2024 8:48 AM   Disclaimer: This note was dictated with voice recognition software. Similar sounding words can inadvertently be transcribed and may not be corrected upon review.

## 2024-01-19 NOTE — Patient Instructions (Signed)
 I am going to order blood work at [facility] to further evaluate your fatty liver and elevated liver test.  Recommend 1-2# weight loss per week until ideal body weight through exercise & diet. Low fat/cholesterol diet.   Avoid sweets, sodas, fruit juices, sweetened beverages like tea, etc. Gradually increase exercise from 15 min daily up to 1 hr per day 5 days/week. Limit alcohol use.  Continue on Nexium .  Continue on Mounjaro .  Follow-up in 3 months.  It was very nice meeting you today.  Dr. Cindie

## 2024-01-20 ENCOUNTER — Other Ambulatory Visit (HOSPITAL_COMMUNITY): Payer: Self-pay

## 2024-01-23 ENCOUNTER — Other Ambulatory Visit (HOSPITAL_COMMUNITY): Payer: Self-pay

## 2024-01-23 ENCOUNTER — Other Ambulatory Visit: Payer: Self-pay

## 2024-01-30 ENCOUNTER — Other Ambulatory Visit (HOSPITAL_COMMUNITY): Payer: Self-pay

## 2024-01-31 ENCOUNTER — Other Ambulatory Visit: Payer: Self-pay

## 2024-01-31 ENCOUNTER — Other Ambulatory Visit (HOSPITAL_COMMUNITY): Payer: Self-pay

## 2024-02-01 ENCOUNTER — Other Ambulatory Visit: Payer: Self-pay | Admitting: Family Medicine

## 2024-02-01 ENCOUNTER — Encounter: Payer: Self-pay | Admitting: Family Medicine

## 2024-02-01 MED ORDER — TIRZEPATIDE 12.5 MG/0.5ML ~~LOC~~ SOAJ
12.5000 mg | SUBCUTANEOUS | 1 refills | Status: DC
  Filled 2024-02-01 – 2024-02-08 (×3): qty 6, 84d supply, fill #0
  Filled 2024-02-08: qty 2, 28d supply, fill #0
  Filled 2024-02-09: qty 6, 84d supply, fill #0
  Filled 2024-02-27: qty 2, 28d supply, fill #0
  Filled 2024-03-09 – 2024-03-22 (×2): qty 2, 28d supply, fill #1

## 2024-02-02 ENCOUNTER — Other Ambulatory Visit (HOSPITAL_COMMUNITY): Payer: Self-pay

## 2024-02-07 ENCOUNTER — Other Ambulatory Visit (HOSPITAL_COMMUNITY): Payer: Self-pay

## 2024-02-08 ENCOUNTER — Other Ambulatory Visit: Payer: Self-pay

## 2024-02-08 ENCOUNTER — Other Ambulatory Visit (HOSPITAL_COMMUNITY): Payer: Self-pay

## 2024-02-09 ENCOUNTER — Other Ambulatory Visit (HOSPITAL_COMMUNITY): Payer: Self-pay

## 2024-02-09 ENCOUNTER — Other Ambulatory Visit: Payer: Self-pay

## 2024-02-27 ENCOUNTER — Other Ambulatory Visit: Payer: Self-pay

## 2024-02-27 ENCOUNTER — Other Ambulatory Visit (HOSPITAL_COMMUNITY): Payer: Self-pay

## 2024-03-09 ENCOUNTER — Other Ambulatory Visit: Payer: Self-pay

## 2024-03-09 ENCOUNTER — Encounter: Payer: Self-pay | Admitting: Pharmacist

## 2024-03-09 ENCOUNTER — Other Ambulatory Visit (HOSPITAL_COMMUNITY): Payer: Self-pay

## 2024-03-14 ENCOUNTER — Encounter: Payer: Self-pay | Admitting: Internal Medicine

## 2024-03-22 ENCOUNTER — Other Ambulatory Visit (HOSPITAL_COMMUNITY): Payer: Self-pay

## 2024-03-22 ENCOUNTER — Other Ambulatory Visit: Payer: Self-pay

## 2024-03-22 ENCOUNTER — Other Ambulatory Visit: Payer: Self-pay | Admitting: Family Medicine

## 2024-03-22 ENCOUNTER — Encounter: Payer: Self-pay | Admitting: Family Medicine

## 2024-03-22 MED ORDER — TIRZEPATIDE 15 MG/0.5ML ~~LOC~~ SOAJ
15.0000 mg | SUBCUTANEOUS | 3 refills | Status: AC
  Filled 2024-03-22 – 2024-04-06 (×4): qty 6, 84d supply, fill #0
  Filled 2024-04-18: qty 2, 28d supply, fill #0
  Filled 2024-04-30 – 2024-05-12 (×2): qty 2, 28d supply, fill #1
  Filled 2024-06-10: qty 2, 28d supply, fill #2

## 2024-03-23 ENCOUNTER — Telehealth: Payer: Self-pay

## 2024-03-23 NOTE — Telephone Encounter (Signed)
 Pt phoned regarding his appt with you on Dec. 3rd @ 2:50. Wants to know if he needs to do labs before or after appt. Please advise.  (Sent to Dr Cindie in secure chat)

## 2024-03-23 NOTE — Telephone Encounter (Signed)
 I ordered a bunch of blood work at [facility] on his previous office visit that does not appear to have been completed. I would like for him to have this done before his fu visit. Thank you  9 mins CC  (Response from Dr Cindie in secure chat) I ordered a bunch of blood work at [facility] on his previous office visit that does not appear to have been completed. I would like for him to have this done before his fu visit. Thank you  9 mins CC  Phoned the pt and advised of the labs that were ordered at his last visit with Dr Cindie. The pt was making sure they were in the system. Pt will have completed before coming.

## 2024-03-26 NOTE — Progress Notes (Unsigned)
 Cardiology Office Note:    Date:  03/27/2024   ID:  Christopher Bowers, DOB Oct 06, 1965, MRN [id number]  PCP:  Bowers, Christopher G, DO  Cardiologist:  Lonni LITTIE Nanas, MD  Electrophysiologist:  None   Referring MD: Bowers, Christopher G, DO   Chief Complaint  Patient presents with   Edema    History of Present Illness:    Christopher Bowers is a 58 y.o. male with a hx of hypertension, OSA, T2DM who is referred by Dr. Bluford for evaluation of dyspnea on exertion.  He does report some dyspnea, feels related to his weight.  Denies any chest pain.  Does report swelling in ankles over past year, typically occurs at end of day.  He does not exercise but golfs every weekend.  Denies any lightheadedness, syncope, or palpitations.  No smoking history.  Family history includes his father had CABG in 22s    Past Medical History:  Diagnosis Date   Carpal tunnel syndrome of right wrist    GERD (gastroesophageal reflux disease)    History of acute respiratory failure    Feb 2014  w/  CAP and ARDS  ---  resolved   History of exercise stress test    01-07-2010   -- negative for ischemia, no chest pain,  hypertensive response to exercise   History of gastritis    History of hiatal hernia    Hypertension    OSA (obstructive sleep apnea)    per pt study done 2013 OSA intolerant cpap  ,  pt uses oral appliance   Seizure (HCC) 11/27/2020    Past Surgical History:  Procedure Laterality Date   CARPAL TUNNEL RELEASE Right 05/01/2015   Procedure: RIGHT CARPAL TUNNEL RELEASE;  Surgeon: Prentice Pagan, MD;  Location: [facility];  Service: Orthopedics;  Laterality: Right;  ANESTHESIA: LOCAL/IV SEDATION   COLONOSCOPY WITH PROPOFOL  N/A 01/11/2019   Procedure: COLONOSCOPY WITH PROPOFOL ;  Surgeon: Shaaron Lamar HERO, MD;  [facility];  Service: Endoscopy;  Laterality: N/A;  9:15am   POLYPECTOMY  01/11/2019   Procedure: POLYPECTOMY;  Surgeon: Shaaron Lamar HERO, MD;  [facility];  Service:  Endoscopy;;  colon   TONSILLECTOMY  age 64   TRANSTHORACIC ECHOCARDIOGRAM  07-11-2012   LVSF  45-55%    Current Medications: Current Meds  Medication Sig   albuterol  (PROVENTIL ) (2.5 MG/3ML) 0.083% nebulizer solution Take 3 mLs (2.5 mg total) by nebulization every 6 (six) hours as needed for wheezing or shortness of breath.   albuterol  (VENTOLIN  HFA) 108 (90 Base) MCG/ACT inhaler Inhale 2 puffs into the lungs every 4 (four) hours as needed for wheezing or shortness of breath   amLODipine  (NORVASC ) 5 MG tablet Take 1 tablet (5 mg total) by mouth daily.   azelastine  (ASTELIN ) 0.1 % nasal spray Place 2 sprays into both nostrils 2 (two) times daily. Use in each nostril as directed   budesonide -formoterol  (SYMBICORT ) 80-4.5 MCG/ACT inhaler Inhale 2 puffs into the lungs in the morning and at bedtime.   Cholecalciferol (VITAMIN D3) 5000 UNITS TABS Take 5,000 Units by mouth daily. (when he remembers)   esomeprazole  (NEXIUM ) 40 MG capsule Take 1 capsule (40 mg total) by mouth 2 (two) times daily before meals   fluorouracil  (EFUDEX ) 5 % cream Apply to affected area on the foot at bedtime   fluticasone  (FLONASE ) 50 MCG/ACT nasal spray Place 2 sprays into both nostrils daily.   ibuprofen (ADVIL) 200 MG tablet Take 200 mg by mouth  every 6 (six) hours as needed.   losartan  (COZAAR ) 100 MG tablet Take 1 tablet (100 mg total) by mouth daily.   metFORMIN  (GLUCOPHAGE ) 500 MG tablet Take 1 tablet (500 mg total) by mouth 2 (two) times daily with a meal.   montelukast  (SINGULAIR ) 10 MG tablet Take 1 tablet (10 mg total) by mouth at bedtime.   rosuvastatin  (CRESTOR ) 10 MG tablet Take 1 tablet (10 mg total) by mouth daily.   tirzepatide  (MOUNJARO ) 15 MG/0.5ML Pen Inject 15 mg into the skin once a week.     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   Patient is right handed.   Social Drivers of Corporate Investment Banker Strain: Not on file  Food Insecurity: Not on file  Transportation Needs: Not on file  Physical Activity: Not on file  Stress: Not on file  Social Connections: Not on file     Family History: The patient's family history includes Diabetes in his father and son; Heart disease in his father. There is no history of Colon cancer.  ROS:   Please see the history of present illness.     All other systems reviewed and are negative.  EKGs/Labs/Other Studies Reviewed:    The following studies were reviewed today:   EKG:   03/27/2024: Normal sinus rhythm, rate 67, LVH, poor R wave progression  Recent Labs: 12/08/2023: ALT 92; BUN 12; Creatinine, Ser 0.94; Hemoglobin 14.8; Platelets 144; Potassium 5.1; Sodium 141  Recent Lipid Panel    Component Value Date/Time   CHOL 148 12/08/2023 1232   TRIG 64 12/08/2023 1232   HDL 57 12/08/2023 1232   CHOLHDL 2.6 12/08/2023 1232   CHOLHDL 4.2 03/14/2013 1236   VLDL 31 03/14/2013 1236   LDLCALC 78 12/08/2023 1232    Physical Exam:    VS:  BP 128/82   Pulse 67   Ht 5' 11 (1.803 m)   Wt 266 lb (120.7 kg)   SpO2 96%   BMI 37.10 kg/m     Wt Readings from Last 3 Encounters:   03/27/24 266 lb (120.7 kg)  01/19/24 269 lb 9.6 oz (122.3 kg)  12/13/23 264 lb (119.7 kg)     GEN:  Well nourished, well developed in no acute distress HEENT: Normal NECK: No JVD; No carotid bruits LYMPHATICS: No lymphadenopathy CARDIAC: RRR, no murmurs, rubs, gallops RESPIRATORY:  Clear to auscultation without rales, wheezing or rhonchi  ABDOMEN: Soft, non-tender,  non-distended MUSCULOSKELETAL:  1+ edema; No deformity  SKIN: Warm and dry NEUROLOGIC:  Alert and oriented x 3 PSYCHIATRIC:  Normal affect   ASSESSMENT:    1. DOE (dyspnea on exertion)   2. Lower extremity edema   3. Abnormal EKG   4. Obesity, unspecified class, unspecified obesity type, unspecified whether serious comorbidity present   5. Essential hypertension   6. Hyperlipidemia, unspecified hyperlipidemia type    PLAN:    Edema/DOE: 1+ lower extremity edema on exam.  No JVD and lungs are clear.  Suspect venous insufficiency and amlodipine  use likely contributing.  Check BNP, CMET.  Check echocardiogram to rule out any structural heart disease  Abnormal EKG: LVH, poor R wave progression.  Check echocardiogram as above  Hypertension: On amlodipine  5 mg daily and losartan  100 mg daily.  Appears controlled.  Hyperlipidemia: On rosuvastatin  10 mg daily.  LDL 78 on 12/08/23  T2DM: On Mounjaro  and metformin .  A1c 6.2% 12/08/2023  OSA: has oral appliance, unable to tolerate CPAP  Obesity: Body mass index is 37.1 kg/m. On Mounjaro .  Will refer to healthy weight and wellness  RTC in 4 months   Medication Adjustments/Labs and Tests Ordered: Current medicines are reviewed at length with the patient today.  Concerns regarding medicines are outlined above.  Orders Placed This Encounter  Procedures   Pro b natriuretic peptide   Comp Met (CMET)   Amb Ref to Medical Weight Management   EKG 12-Lead   ECHOCARDIOGRAM COMPLETE   No orders of the defined types were placed in this encounter.   Patient Instructions   Medication Instructions:  Your physician recommends that you continue on your current medications as directed. Please refer to the Current Medication list given to you today.  *If you need a refill on your cardiac medications before your next appointment, please call your pharmacy*  Lab Work: Have lab work done in lab on the first floor today--CMET and BNP If you have labs (blood work) drawn today and your tests are completely normal, you will receive your results only by: MyChart Message (if you have MyChart) OR A paper copy in the mail If you have any lab test that is abnormal or we need to change your treatment, we will call you to review the results.  Testing/Procedures: Your physician has requested that you have an echocardiogram. Echocardiography is a painless test that uses sound waves to create images of your heart. It provides your doctor with information about the size and shape of your heart and how well your heart's chambers and valves are working. This procedure takes approximately one hour. There are no restrictions for this procedure. Please do NOT wear cologne, perfume, aftershave, or lotions (deodorant is allowed). Please arrive 15 minutes prior to your appointment time.  Please note: We ask at that you not bring children with you during ultrasound (echo/ vascular) testing. Due to room size and safety concerns, children are not allowed in the ultrasound rooms during exams. Our front office staff cannot provide observation of children in our lobby area while testing is being conducted. An adult accompanying a patient to their appointment will only be allowed in the ultrasound room at the discretion of the ultrasound technician under special circumstances. We apologize for any inconvenience.   Follow-Up: At [facility], you and your health needs are our priority.  As part of our continuing mission to provide you with exceptional heart care, our providers are all part  of one team.  This  team includes your primary Cardiologist (physician) and Advanced Practice Providers or APPs (Physician Assistants and Nurse Practitioners) who all work together to provide you with the care you need, when you need it.  Your next appointment:   4 month(s)  Provider:   Lonni LITTIE Nanas, MD    We recommend signing up for the patient portal called MyChart.  Sign up information is provided on this After Visit Summary.  MyChart is used to connect with patients for Virtual Visits (Telemedicine).  Patients are able to view lab/test results, encounter notes, upcoming appointments, etc.  Non-urgent messages can be sent to your provider as well.   To learn more about what you can do with MyChart, go to [email or web address].   Other Instructions You have been referred to Healthy weight and wellness           Signed, Lonni LITTIE Nanas, MD  03/27/2024 2:53 PM    [facility]

## 2024-03-27 ENCOUNTER — Encounter: Payer: Self-pay | Admitting: Cardiology

## 2024-03-27 ENCOUNTER — Ambulatory Visit: Attending: Cardiology | Admitting: Cardiology

## 2024-03-27 VITALS — BP 128/82 | HR 67 | Ht 71.0 in | Wt 266.0 lb

## 2024-03-27 DIAGNOSIS — I10 Essential (primary) hypertension: Secondary | ICD-10-CM | POA: Diagnosis not present

## 2024-03-27 DIAGNOSIS — R9431 Abnormal electrocardiogram [ECG] [EKG]: Secondary | ICD-10-CM | POA: Diagnosis not present

## 2024-03-27 DIAGNOSIS — R0609 Other forms of dyspnea: Secondary | ICD-10-CM | POA: Diagnosis not present

## 2024-03-27 DIAGNOSIS — R7989 Other specified abnormal findings of blood chemistry: Secondary | ICD-10-CM | POA: Diagnosis not present

## 2024-03-27 DIAGNOSIS — K7581 Nonalcoholic steatohepatitis (NASH): Secondary | ICD-10-CM | POA: Diagnosis not present

## 2024-03-27 DIAGNOSIS — R600 Localized edema: Secondary | ICD-10-CM

## 2024-03-27 DIAGNOSIS — E669 Obesity, unspecified: Secondary | ICD-10-CM | POA: Diagnosis not present

## 2024-03-27 DIAGNOSIS — E785 Hyperlipidemia, unspecified: Secondary | ICD-10-CM

## 2024-03-27 NOTE — Patient Instructions (Signed)
 Medication Instructions:  Your physician recommends that you continue on your current medications as directed. Please refer to the Current Medication list given to you today.  *If you need a refill on your cardiac medications before your next appointment, please call your pharmacy*  Lab Work: Have lab work done in lab on the first floor today--CMET and BNP If you have labs (blood work) drawn today and your tests are completely normal, you will receive your results only by: MyChart Message (if you have MyChart) OR A paper copy in the mail If you have any lab test that is abnormal or we need to change your treatment, we will call you to review the results.  Testing/Procedures: Your physician has requested that you have an echocardiogram. Echocardiography is a painless test that uses sound waves to create images of your heart. It provides your doctor with information about the size and shape of your heart and how well your heart's chambers and valves are working. This procedure takes approximately one hour. There are no restrictions for this procedure. Please do NOT wear cologne, perfume, aftershave, or lotions (deodorant is allowed). Please arrive 15 minutes prior to your appointment time.  Please note: We ask at that you not bring children with you during ultrasound (echo/ vascular) testing. Due to room size and safety concerns, children are not allowed in the ultrasound rooms during exams. Our front office staff cannot provide observation of children in our lobby area while testing is being conducted. An adult accompanying a patient to their appointment will only be allowed in the ultrasound room at the discretion of the ultrasound technician under special circumstances. We apologize for any inconvenience.   Follow-Up: At [facility], you and your health needs are our priority.  As part of our continuing mission to provide you with exceptional heart care, our providers are all part of  one team.  This team includes your primary Cardiologist (physician) and Advanced Practice Providers or APPs (Physician Assistants and Nurse Practitioners) who all work together to provide you with the care you need, when you need it.  Your next appointment:   4 month(s)  Provider:   Lonni LITTIE Nanas, MD    We recommend signing up for the patient portal called MyChart.  Sign up information is provided on this After Visit Summary.  MyChart is used to connect with patients for Virtual Visits (Telemedicine).  Patients are able to view lab/test results, encounter notes, upcoming appointments, etc.  Non-urgent messages can be sent to your provider as well.   To learn more about what you can do with MyChart, go to [email or web address].   Other Instructions You have been referred to Healthy weight and wellness

## 2024-03-28 ENCOUNTER — Ambulatory Visit: Payer: Self-pay | Admitting: Cardiology

## 2024-03-28 LAB — COMPREHENSIVE METABOLIC PANEL WITH GFR
ALT: 78 IU/L — ABNORMAL HIGH (ref 0–44)
AST: 52 IU/L — ABNORMAL HIGH (ref 0–40)
Albumin: 4.6 g/dL (ref 3.8–4.9)
Alkaline Phosphatase: 71 IU/L (ref 47–123)
BUN/Creatinine Ratio: 19 (ref 9–20)
BUN: 15 mg/dL (ref 6–24)
Bilirubin Total: 0.5 mg/dL (ref 0.0–1.2)
CO2: 22 mmol/L (ref 20–29)
Calcium: 9.5 mg/dL (ref 8.7–10.2)
Chloride: 101 mmol/L (ref 96–106)
Creatinine, Ser: 0.78 mg/dL (ref 0.76–1.27)
Globulin, Total: 2.1 g/dL (ref 1.5–4.5)
Glucose: 97 mg/dL (ref 70–99)
Potassium: 4.4 mmol/L (ref 3.5–5.2)
Sodium: 140 mmol/L (ref 134–144)
Total Protein: 6.7 g/dL (ref 6.0–8.5)
eGFR: 103 mL/min/1.73 (ref >59)

## 2024-03-28 LAB — PRO B NATRIURETIC PEPTIDE: NT-Pro BNP: 52 pg/mL (ref 0–210)

## 2024-03-29 LAB — NASH FIBROSURE(R) PLUS
ALPHA 2-MACROGLOBULINS, QN: 309 mg/dL — ABNORMAL HIGH (ref 110–276)
ALT (SGPT) P5P: 96 IU/L — ABNORMAL HIGH (ref 0–55)
AST (SGOT) P5P: 64 IU/L — ABNORMAL HIGH (ref 0–40)
Apolipoprotein A-1: 155 mg/dL (ref 101–178)
Bilirubin, Total: 0.5 mg/dL (ref 0.0–1.2)
Cholesterol, Total: 153 mg/dL (ref 100–199)
Fibrosis Score: 0.65 — ABNORMAL HIGH (ref 0.00–0.21)
GGT: 292 IU/L — ABNORMAL HIGH (ref 0–65)
Glucose: 100 mg/dL — ABNORMAL HIGH (ref 70–99)
Haptoglobin: 158 mg/dL (ref 29–370)
NASH Score: 0.89 — ABNORMAL HIGH (ref 0.00–0.25)
Steatosis Score: 0.67 — ABNORMAL HIGH (ref 0.00–0.40)
Triglycerides: 113 mg/dL (ref 0–149)

## 2024-04-02 ENCOUNTER — Other Ambulatory Visit: Payer: Self-pay

## 2024-04-02 ENCOUNTER — Other Ambulatory Visit (HOSPITAL_COMMUNITY): Payer: Self-pay

## 2024-04-02 LAB — IRON,TIBC AND FERRITIN PANEL
Ferritin: 130 ng/mL (ref 30–400)
Iron Saturation: 24 % (ref 15–55)
Iron: 91 ug/dL (ref 38–169)
Total Iron Binding Capacity: 382 ug/dL (ref 250–450)
UIBC: 291 ug/dL (ref 111–343)

## 2024-04-02 LAB — ANA W/REFLEX IF POSITIVE: Anti Nuclear Antibody (ANA): NEGATIVE

## 2024-04-02 LAB — HEPATITIS B CORE ANTIBODY, TOTAL: Hep B Core Total Ab: POSITIVE — AB

## 2024-04-02 LAB — IMMUNOGLOBULINS A/E/G/M, SERUM
IgE (Immunoglobulin E), Serum: 228 [IU]/mL (ref 6–495)
IgG (Immunoglobin G), Serum: 616 mg/dL (ref 603–1613)
IgM (Immunoglobulin M), Srm: 78 mg/dL (ref 20–172)

## 2024-04-02 LAB — HEPATITIS A ANTIBODY, TOTAL: hep A Total Ab: NEGATIVE

## 2024-04-02 LAB — HEPATITIS C ANTIBODY: Hep C Virus Ab: NONREACTIVE

## 2024-04-02 LAB — CELIAC DISEASE PANEL
Endomysial IgA: NEGATIVE
IgA/Immunoglobulin A, Serum: 72 mg/dL — ABNORMAL LOW (ref 90–386)
Transglutaminase IgA: <2 U/mL (ref 0–3)

## 2024-04-02 LAB — HEPATITIS B SURFACE ANTIGEN: Hepatitis B Surface Ag: NEGATIVE

## 2024-04-02 LAB — HEPATITIS B SURFACE ANTIBODY,QUALITATIVE: Hep B Surface Ab, Qual: REACTIVE

## 2024-04-02 LAB — TISSUE TRANSGLUTAMINASE, IGG: Tissue Transglut Ab: 9 U/mL — AB (ref 0–5)

## 2024-04-02 LAB — ANTI-SMOOTH MUSCLE ANTIBODY, IGG: Smooth Muscle Ab: 11 U (ref 0–19)

## 2024-04-03 ENCOUNTER — Other Ambulatory Visit: Payer: Self-pay

## 2024-04-05 ENCOUNTER — Other Ambulatory Visit: Payer: Self-pay

## 2024-04-05 ENCOUNTER — Ambulatory Visit (HOSPITAL_BASED_OUTPATIENT_CLINIC_OR_DEPARTMENT_OTHER)
Admission: RE | Admit: 2024-04-05 | Discharge: 2024-04-05 | Disposition: A | Discharge status: Home / Self Care | Source: Ambulatory Visit | Attending: Cardiology | Admitting: Cardiology

## 2024-04-05 DIAGNOSIS — R9431 Abnormal electrocardiogram [ECG] [EKG]: Secondary | ICD-10-CM | POA: Diagnosis not present

## 2024-04-05 DIAGNOSIS — R0609 Other forms of dyspnea: Secondary | ICD-10-CM | POA: Diagnosis not present

## 2024-04-05 LAB — ECHOCARDIOGRAM COMPLETE
AR max vel: 2.6 cm2
AV Area VTI: 2.6 cm2
AV Area mean vel: 2.68 cm2
AV Mean grad: 4 mmHg
AV Peak grad: 8.3 mmHg
AV Vena cont: 0.2 cm
Ao pk vel: 1.44 m/s
Area-P 1/2: 3.3 cm2
Calc EF: 61.9 %
S' Lateral: 2.6 cm
Single Plane A2C EF: 63 %
Single Plane A4C EF: 63.4 %

## 2024-04-06 ENCOUNTER — Other Ambulatory Visit: Payer: Self-pay

## 2024-04-06 ENCOUNTER — Other Ambulatory Visit (HOSPITAL_COMMUNITY): Payer: Self-pay

## 2024-04-18 ENCOUNTER — Other Ambulatory Visit: Payer: Self-pay

## 2024-04-18 ENCOUNTER — Other Ambulatory Visit (HOSPITAL_COMMUNITY): Payer: Self-pay

## 2024-04-25 ENCOUNTER — Encounter: Payer: Self-pay | Admitting: Dermatology

## 2024-04-26 ENCOUNTER — Other Ambulatory Visit (HOSPITAL_COMMUNITY): Payer: Self-pay

## 2024-04-26 ENCOUNTER — Other Ambulatory Visit: Payer: Self-pay | Admitting: Dermatology

## 2024-04-26 DIAGNOSIS — L739 Follicular disorder, unspecified: Secondary | ICD-10-CM

## 2024-04-26 MED ORDER — CLINDAMYCIN PHOSPHATE 1 % EX SOLN
Freq: Every day | CUTANEOUS | 11 refills | Status: AC | PRN
  Filled 2024-04-26: qty 60, 30d supply, fill #0

## 2024-04-27 ENCOUNTER — Other Ambulatory Visit (HOSPITAL_COMMUNITY): Payer: Self-pay

## 2024-04-30 ENCOUNTER — Other Ambulatory Visit: Payer: Self-pay

## 2024-04-30 ENCOUNTER — Other Ambulatory Visit: Payer: Self-pay | Admitting: Nurse Practitioner

## 2024-04-30 ENCOUNTER — Other Ambulatory Visit (HOSPITAL_COMMUNITY): Payer: Self-pay

## 2024-04-30 DIAGNOSIS — J4540 Moderate persistent asthma, uncomplicated: Secondary | ICD-10-CM

## 2024-04-30 MED ORDER — BUDESONIDE-FORMOTEROL FUMARATE 80-4.5 MCG/ACT IN AERO
2.0000 | INHALATION_SPRAY | Freq: Two times a day (BID) | RESPIRATORY_TRACT | 6 refills | Status: AC
  Filled 2024-04-30: qty 10.2, 30d supply, fill #0
  Filled 2024-06-05: qty 10.2, 30d supply, fill #1

## 2024-05-01 ENCOUNTER — Other Ambulatory Visit: Payer: Self-pay

## 2024-05-01 DIAGNOSIS — Z0289 Encounter for other administrative examinations: Secondary | ICD-10-CM

## 2024-05-02 ENCOUNTER — Encounter: Payer: Self-pay | Admitting: Internal Medicine

## 2024-05-02 ENCOUNTER — Encounter (INDEPENDENT_AMBULATORY_CARE_PROVIDER_SITE_OTHER): Payer: Self-pay | Admitting: Nurse Practitioner

## 2024-05-02 ENCOUNTER — Ambulatory Visit: Admitting: Internal Medicine

## 2024-05-02 ENCOUNTER — Ambulatory Visit (INDEPENDENT_AMBULATORY_CARE_PROVIDER_SITE_OTHER): Admitting: Nurse Practitioner

## 2024-05-02 VITALS — BP 116/79 | HR 87 | Temp 97.1°F | Ht 71.0 in | Wt 262.0 lb

## 2024-05-02 VITALS — BP 106/71 | HR 79 | Temp 98.1°F | Ht 70.0 in | Wt 256.0 lb

## 2024-05-02 DIAGNOSIS — Z1211 Encounter for screening for malignant neoplasm of colon: Secondary | ICD-10-CM | POA: Diagnosis not present

## 2024-05-02 DIAGNOSIS — Z7985 Long-term (current) use of injectable non-insulin antidiabetic drugs: Secondary | ICD-10-CM

## 2024-05-02 DIAGNOSIS — K7581 Nonalcoholic steatohepatitis (NASH): Secondary | ICD-10-CM

## 2024-05-02 DIAGNOSIS — R748 Abnormal levels of other serum enzymes: Secondary | ICD-10-CM | POA: Diagnosis not present

## 2024-05-02 DIAGNOSIS — Z7984 Long term (current) use of oral hypoglycemic drugs: Secondary | ICD-10-CM

## 2024-05-02 DIAGNOSIS — K760 Fatty (change of) liver, not elsewhere classified: Secondary | ICD-10-CM | POA: Diagnosis not present

## 2024-05-02 DIAGNOSIS — Z6836 Body mass index (BMI) 36.0-36.9, adult: Secondary | ICD-10-CM

## 2024-05-02 DIAGNOSIS — K219 Gastro-esophageal reflux disease without esophagitis: Secondary | ICD-10-CM

## 2024-05-02 DIAGNOSIS — E7800 Pure hypercholesterolemia, unspecified: Secondary | ICD-10-CM

## 2024-05-02 DIAGNOSIS — E1169 Type 2 diabetes mellitus with other specified complication: Secondary | ICD-10-CM | POA: Diagnosis not present

## 2024-05-02 DIAGNOSIS — E66812 Obesity, class 2: Secondary | ICD-10-CM | POA: Diagnosis not present

## 2024-05-02 DIAGNOSIS — E669 Obesity, unspecified: Secondary | ICD-10-CM

## 2024-05-02 DIAGNOSIS — I10 Essential (primary) hypertension: Secondary | ICD-10-CM | POA: Diagnosis not present

## 2024-05-02 DIAGNOSIS — R7989 Other specified abnormal findings of blood chemistry: Secondary | ICD-10-CM

## 2024-05-02 NOTE — Progress Notes (Signed)
 Primary Care Physician:  Cook, Jayce G, DO Primary Gastroenterologist:  Dr. 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 Patient denies any current gi related issues and does not currently take any medication for the MASH.     HPI:   Christopher Bowers is a 58 y.o. male who presents to clinic today for follow up visit.   Elevated LFTs, hepatic steatosis: Patient has had elevated aminotransferases going back to at least February 2022.  Most recently blood work 12/08/2023 with AST 79, ALT 92, alk phos 78, T. bili 0.5, albumin 4.9, platelets 144.  RUQ US  w/ elastography 07/04/2023 with hepatic steatosis, median kPa of 2.1 though reduced accuracy.  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  Also notes daily alcohol use of 2 mixed drinks a day.  Denies any herbal supplements including ashwagandha, green tea extract, turmeric.  Currently on a GLP-1 as well as rosuvastatin .  Has lost 7 pounds since last office visit.  Denies any illicit drug use.  Blood work 03/19/2024: Fibrosure F3 fibrosis, S2-S3 steatosis.  Hep B core antibody positive, surface antibody positive, hep B surface antigen negative, hep C negative, not immune to hep A.  Autoimmune markers unremarkable, iron studies WNL.  States after he received his blood work results he completely discontinued all alcohol.  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  He is somewhat surprised about this, though does report his first wife years ago did partake in drug use.  He may have been exposed to her.  They are no longer together.  Chronic GERD: Currently taking esomeprazole  twice daily with good control.  No dysphagia odynophagia.  No epigastric or chest pain.  Colon cancer screening: Colonoscopy 01/11/2019 with 4 polyps removed.  All hyperplastic on pathology.   10-year recall.  Past Medical History:  Diagnosis Date   Carpal tunnel syndrome of right wrist    GERD (gastroesophageal reflux disease)    History of acute respiratory failure    Feb 2014  w/  CAP and ARDS  ---  resolved   History of exercise stress test    01-07-2010   -- negative for ischemia, no chest pain,  hypertensive response to exercise   History of gastritis    History of hiatal hernia    Hypertension    OSA (obstructive sleep apnea)    per pt study done 2013 OSA intolerant cpap  ,  pt uses oral appliance   Seizure (HCC) 11/27/2020    Past Surgical History:  Procedure Laterality Date   CARPAL TUNNEL RELEASE Right 05/01/2015   Procedure: RIGHT CARPAL TUNNEL RELEASE;  Surgeon: Prentice Pagan, MD;  Location: [facility];  Service: Orthopedics;  Laterality: Right;  ANESTHESIA: LOCAL/IV SEDATION   COLONOSCOPY WITH PROPOFOL  N/A 01/11/2019   Procedure: COLONOSCOPY WITH PROPOFOL ;  Surgeon: Shaaron Lamar HERO, MD;  [facility]: [facility];  Service: Endoscopy;  Laterality: N/A;  9:15am   POLYPECTOMY  01/11/2019   Procedure: POLYPECTOMY;  Surgeon: Shaaron Lamar HERO, MD;  Location: [facility];  Service: Endoscopy;;  colon   TONSILLECTOMY  age 82   TRANSTHORACIC ECHOCARDIOGRAM  07-11-2012   LVSF  45-55%    Current Outpatient Medications  Medication Sig Dispense Refill   albuterol  (PROVENTIL ) (2.5 MG/3ML) 0.083% nebulizer solution Take 3 mLs (2.5 mg  total) by nebulization every 6 (six) hours as needed for wheezing or shortness of breath. 75 mL 4   albuterol  (VENTOLIN  HFA) 108 (90 Base) MCG/ACT inhaler Inhale 2 puffs into the lungs every 4 (four) hours as needed for wheezing or shortness of breath 6.7 g 2   amLODipine  (NORVASC ) 5 MG tablet Take 1 tablet (5 mg total) by mouth daily. 90 tablet 1   azelastine  (ASTELIN ) 0.1 % nasal spray Place 2 sprays into both nostrils 2 (two) times daily. Use in each nostril as directed 30 mL 5   budesonide -formoterol  (SYMBICORT ) 80-4.5 MCG/ACT  inhaler Inhale 2 puffs into the lungs in the morning and at bedtime. 10.2 g 6   Cholecalciferol (VITAMIN D3) 5000 UNITS TABS Take 5,000 Units by mouth daily. (when he remembers)     clindamycin  (CLEOCIN  T) 1 % external solution Apply topically daily as needed. To scalp 60 mL 11   esomeprazole  (NEXIUM ) 40 MG capsule Take 1 capsule (40 mg total) by mouth 2 (two) times daily before meals 180 capsule 1   fluorouracil  (EFUDEX ) 5 % cream Apply to affected area on the foot at bedtime (Patient taking differently: as needed. Apply to affected area on the foot at bedtime) 40 g 1   fluticasone  (FLONASE ) 50 MCG/ACT nasal spray Place 2 sprays into both nostrils daily. 16 g 1   ibuprofen (ADVIL) 200 MG tablet Take 200 mg by mouth every 6 (six) hours as needed.     losartan  (COZAAR ) 100 MG tablet Take 1 tablet (100 mg total) by mouth daily. 90 tablet 1   metFORMIN  (GLUCOPHAGE ) 500 MG tablet Take 1 tablet (500 mg total) by mouth 2 (two) times daily with a meal. 180 tablet 3   montelukast  (SINGULAIR ) 10 MG tablet Take 1 tablet (10 mg total) by mouth at bedtime. 30 tablet 11   rosuvastatin  (CRESTOR ) 10 MG tablet Take 1 tablet (10 mg total) by mouth daily. 90 tablet 1   tirzepatide  (MOUNJARO ) 15 MG/0.5ML Pen Inject 15 mg into the skin once a week. 6 mL 3   No current facility-administered medications for this visit.    Allergies as of 05/02/2024 - Review Complete 05/02/2024  Allergen Reaction Noted   Lisinopril   09/23/2021   Augmentin [amoxicillin-pot clavulanate] Rash 09/07/2012    Family History  Problem Relation Age of Onset   Diabetes Father    Heart disease Father    Diabetes Son    Colon cancer Neg Hx     Social History   Socioeconomic History   Marital status: Married    Spouse name: Not on file   Number of children: 1   Years of education: college   Highest education level: Not on file  Occupational History   Occupation: Investment Banker, Corporate: [employer]    Comment: truck  Tobacco  Use   Smoking status: Never   Smokeless tobacco: Current    Types: Snuff    Last attempt to quit: 02/26/2015   Tobacco comments:    occasional snuff  Vaping Use   Vaping status: Never Used  Substance and Sexual Activity   Alcohol use: Yes    Alcohol/week: 5.0 standard drinks of alcohol    Types: 5 Shots of liquor per week    Comment: 5-6 liquor drinks per day   Drug use: No   Sexual activity: Not on file  Other Topics Concern   Not on file  Social History Narrative   Patient drinks about 3  cups of caffeine daily.   Patient is right handed.   Social Drivers of Corporate Investment Banker Strain: Not on file  Food Insecurity: Not on file  Transportation Needs: Not on file  Physical Activity: Not on file  Stress: Not on file  Social Connections: Not on file  Intimate Partner Violence: Not on file    Subjective: Review of Systems  Constitutional:  Negative for chills and fever.  HENT:  Negative for congestion and hearing loss.   Eyes:  Negative for blurred vision and double vision.  Respiratory:  Negative for cough and shortness of breath.   Cardiovascular:  Negative for chest pain and palpitations.  Gastrointestinal:  Negative for abdominal pain, blood in stool, constipation, diarrhea, heartburn, melena and vomiting.  Genitourinary:  Negative for dysuria and urgency.  Musculoskeletal:  Negative for joint pain and myalgias.  Skin:  Negative for itching and rash.  Neurological:  Negative for dizziness and headaches.  Psychiatric/Behavioral:  Negative for depression. The patient is not nervous/anxious.        Objective: BP 116/79 (BP Location: Left Arm, Patient Position: Sitting, Cuff Size: Large)   Pulse 87   Temp (!) 97.1 F (36.2 C) (Temporal)   Ht 5' 11 (1.803 m)   Wt 262 lb (118.8 kg)   BMI 36.54 kg/m  Physical Exam Constitutional:      Appearance: Normal appearance.  HENT:     Head: Normocephalic and atraumatic.  Eyes:     Extraocular Movements:  Extraocular movements intact.     Conjunctiva/sclera: Conjunctivae normal.  Cardiovascular:     Rate and Rhythm: Normal rate and regular rhythm.  Pulmonary:     Effort: Pulmonary effort is normal.     Breath sounds: Normal breath sounds.  Abdominal:     General: Bowel sounds are normal.     Palpations: Abdomen is soft.  Musculoskeletal:        General: Normal range of motion.     Cervical back: Normal range of motion and neck supple.  Skin:    General: Skin is warm.  Neurological:     General: No focal deficit present.     Mental Status: He is alert and oriented to person, place, and time.  Psychiatric:        Mood and Affect: Mood normal.        Behavior: Behavior normal.      Assessment/Plan:  1.  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  Also likely has component of alcohol induced liver disease drinking 2 mixed drinks daily.   Fibrosis 4 Score = 3.32 (High risk), Fibrosure F3 fibrosis, S2-3 steatosis   RUQ US  w/ elastography 07/04/2023 with hepatic steatosis, median kPa of 2.1 though reduced accuracy.  Continue on GLP-1.  Counseled on importance of keeping diabetes, hypertension, dyslipidemia under good control.  Recommend 1-2# weight loss per week until ideal body weight through exercise & diet. Low fat/cholesterol diet.   Avoid sweets, sodas, fruit juices, sweetened beverages like tea, etc. Gradually increase exercise from 15 min daily up to 1 hr per day 5 days/week.  Patient has completely discontinued all alcohol use.  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  Congratulated him on this.  Will plan on repeat LFTs and FibroScan in 2 to 3 months.  If FibroScan confirms F2 or F3 fibrosis will start process of getting patient on Rezdiffra.   2.  Chronic GERD-well-controlled on esomeprazole ,  will continue.  3.  Colon cancer screening-colonoscopy recall 2030  Follow-up in 2-3  months.   05/02/2024 2:47 PM   Disclaimer: This note was dictated with voice recognition software. Similar sounding words can inadvertently be transcribed and may not be corrected upon review.

## 2024-05-02 NOTE — Patient Instructions (Signed)
 I think you are doing great.  Congratulations on the weight loss as well as discontinuing alcohol usage.  We will plan on rechecking your liver function test in a few months when you come back to the office.  We will likely perform FibroScan at that juncture as well.  We may consider Rezdiffra though will hold off for now.   Follow-up in 2 months.  I hope you both have a great Christmas season.  Dr. Cindie

## 2024-05-02 NOTE — Progress Notes (Signed)
 [address] [place], [place], [place] [address] Office: [phone]  /  Fax: [phone]   Initial Consultation    Christopher Bowers was seen in clinic today to evaluate for obesity. He is interested in losing weight to improve overall health and reduce the risk of weight related complications. He presents today to review program treatment options, initial physical assessment, and evaluation.    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 Cindie- 03/19/2024: Fibrosure F3 fibrosis, S2-S3 steatosis.  Fib 4 score is 3.32 high risk. He has completely discontinued all alcohol use.  He does have Type 2 DM and is currently on Mounjaro  15 mg SQ QW and Metformin  500 mg BID.  He has been on Mounjaro  for approximately 1 year and has lost 15 pounds.  He does have essential hypertension which is currently well controlled with amlodipine  5 mg every day and Losartan  100 mg every day. He does have pure hypercholesterolemia and is currently on Crestor  10 mg every day.  Anthropometrics and Bioimpedance Analysis   Body mass index is 36.73 kg/m. Body Fat Mass : 32.5 % Visceral Fat Mass Rating : 19   Obesity Related Diseases and Complications  Obesity Quality of Life and Psychosocial Complications: Body image dissatisfaction and Reduced health-related quality of life  Cardiometabolic: Type 2 diabetes, Dyslipidemia or hypercholesterolemia, Hypertension, MASLD or MASH, DOE, and Fatigue  Biomechanical: Low back pain, Obstructive sleep apnea, Asthma, and GERD   Weight Related History  He was referred by: Specialist  When asked what they would like to accomplish? He states: Adopt a healthier eating pattern and lifestyle, Improve energy levels and physical activity, Improve existing medical conditions, Improve quality of life, Improve appearance, Improve self-confidence, and Lose 35 lbs  Weight history: First noticed weight gain in late 20's and early 30's  Highest weight: 272  Contributing  factors: family history of obesity, disruption of circadian rhythm / sleep disordered breathing, reduced physical activity, chronic skipping of meals, strong orexigenic signaling and/or inadequate inhibitory control , enticing relationships and enviroment, multiple weight loss attempts in the past, and sedentary job  Prior weight loss attempts: Low Carb, Ketogenic, Intermittent fasting, and High Protein  Current or previous pharmacotherapy: GLP-1, Metformin , and GLP-1 + GIP  Response to medication: Still on Mounjaro  15 mg SQ QW( dose started 2-3 weeks ago) and Metformin  500 mg BID.   Current nutrition plan: Low-carb and High-protein  Greatest challenge with dieting: holidays.  Current level of physical activity: Sports golf once a week and NEAT  Barriers to Exercise: motivation  Readiness and Motivation  On a scale from 0 to 10 How ready are you to make changes to your eating and physical activity to lose weight? 9 How important is it for you to lose weight right now ? 9 How confident are you that you can lose weight if you try? 10  Past Medical History   Past Medical History:  Diagnosis Date   Carpal tunnel syndrome of right wrist    GERD (gastroesophageal reflux disease)    History of acute respiratory failure    Feb 2014  w/  CAP and ARDS  ---  resolved   History of exercise stress test    01-07-2010   -- negative for ischemia, no chest pain,  hypertensive response to exercise   History of gastritis    History of hiatal hernia    Hypertension    OSA (obstructive sleep apnea)    per pt study done 2013 OSA intolerant cpap  ,  pt uses oral appliance   Seizure (HCC) 11/27/2020     Objective    BP 106/71   Pulse 79   Temp 98.1 F (36.7 C)   Ht 5' 10 (1.778 m)   Wt 256 lb (116.1 kg)   SpO2 95%   BMI 36.73 kg/m  He was weighed on the bioimpedance scale: Body mass index is 36.73 kg/m.    General:  Alert, oriented and cooperative. Patient is in no acute distress.   Respiratory: Normal respiratory effort, no problems with respiration noted   Gait: able to ambulate independently  Mental Status: Normal mood and affect. Normal behavior. Normal judgment and thought content.   Diagnostic Data Reviewed  BMET    Component Value Date/Time   NA 140 03/27/2024 1448   K 4.4 03/27/2024 1448   CL 101 03/27/2024 1448   CO2 22 03/27/2024 1448   GLUCOSE 97 03/27/2024 1448   GLUCOSE 134 (H) 05/01/2015 1146   BUN 15 03/27/2024 1448   CREATININE 0.78 03/27/2024 1448   CREATININE 1.16 03/14/2013 1236   CALCIUM  9.5 03/27/2024 1448   GFRNONAA 72 07/04/2020 0821   GFRAA 83 07/04/2020 0821   Lab Results  Component Value Date   HGBA1C 6.2 (H) 12/08/2023   HGBA1C 4.4 02/02/2017   No results found for: INSULIN  CBC    Component Value Date/Time   WBC 8.5 12/08/2023 1232   WBC 10.9 (H) 03/22/2017 1710   RBC 4.77 12/08/2023 1232   RBC 5.20 03/22/2017 1710   HGB 14.8 12/08/2023 1232   HCT 45.4 12/08/2023 1232   PLT 144 (L) 12/08/2023 1232   MCV 95 12/08/2023 1232   MCH 31.0 12/08/2023 1232   MCH 31.0 09/26/2014 0740   MCHC 32.6 12/08/2023 1232   MCHC 33.6 03/22/2017 1710   RDW 13.2 12/08/2023 1232   Iron/TIBC/Ferritin/ %Sat    Component Value Date/Time   IRON 91 03/27/2024 1449   TIBC 382 03/27/2024 1449   FERRITIN 130 03/27/2024 1449   IRONPCTSAT 24 03/27/2024 1449   Lipid Panel     Component Value Date/Time   CHOL 153 03/27/2024 1449   TRIG 113 03/27/2024 1449   HDL 57 12/08/2023 1232   CHOLHDL 2.6 12/08/2023 1232   CHOLHDL 4.2 03/14/2013 1236   VLDL 31 03/14/2013 1236   LDLCALC 78 12/08/2023 1232   Hepatic Function Panel     Component Value Date/Time   PROT 6.7 03/27/2024 1448   ALBUMIN 4.6 03/27/2024 1448   AST 52 (H) 03/27/2024 1448   ALT 78 (H) 03/27/2024 1448   ALKPHOS 71 03/27/2024 1448   BILITOT 0.5 03/27/2024 1448   BILIDIR 0.21 05/07/2021 0818   IBILI 0.4 07/19/2012 0445      Component Value Date/Time   TSH 6.190 (H)  08/02/2013 0950    Medications  Outpatient Encounter Medications as of 05/02/2024  Medication Sig   albuterol  (PROVENTIL ) (2.5 MG/3ML) 0.083% nebulizer solution Take 3 mLs (2.5 mg total) by nebulization every 6 (six) hours as needed for wheezing or shortness of breath.   albuterol  (VENTOLIN  HFA) 108 (90 Base) MCG/ACT inhaler Inhale 2 puffs into the lungs every 4 (four) hours as needed for wheezing or shortness of breath   amLODipine  (NORVASC ) 5 MG tablet Take 1 tablet (5 mg total) by mouth daily.   azelastine  (ASTELIN ) 0.1 % nasal spray Place 2 sprays into both nostrils 2 (two) times daily. Use in each nostril as directed   budesonide -formoterol  (SYMBICORT ) 80-4.5 MCG/ACT inhaler Inhale 2 puffs into  the lungs in the morning and at bedtime.   Cholecalciferol (VITAMIN D3) 5000 UNITS TABS Take 5,000 Units by mouth daily. (when he remembers)   clindamycin  (CLEOCIN  T) 1 % external solution Apply topically daily as needed. To scalp   esomeprazole  (NEXIUM ) 40 MG capsule Take 1 capsule (40 mg total) by mouth 2 (two) times daily before meals   fluorouracil  (EFUDEX ) 5 % cream Apply to affected area on the foot at bedtime (Patient taking differently: as needed. Apply to affected area on the foot at bedtime)   fluticasone  (FLONASE ) 50 MCG/ACT nasal spray Place 2 sprays into both nostrils daily.   ibuprofen (ADVIL) 200 MG tablet Take 200 mg by mouth every 6 (six) hours as needed.   losartan  (COZAAR ) 100 MG tablet Take 1 tablet (100 mg total) by mouth daily.   metFORMIN  (GLUCOPHAGE ) 500 MG tablet Take 1 tablet (500 mg total) by mouth 2 (two) times daily with a meal.   montelukast  (SINGULAIR ) 10 MG tablet Take 1 tablet (10 mg total) by mouth at bedtime.   rosuvastatin  (CRESTOR ) 10 MG tablet Take 1 tablet (10 mg total) by mouth daily.   tirzepatide  (MOUNJARO ) 15 MG/0.5ML Pen Inject 15 mg into the skin once a week.   No facility-administered encounter medications on file as of 05/02/2024.     Assessment and  Plan   Type 2 diabetes mellitus in patient with obesity (HCC) Limit simple carbohydrates, increase lean proteins Decreasing body weight by 10-15% can improve glucose levels Continue Mounjaro  15 mg SQ QW and Metformin  500 mg 1 tab PO BID Continue to follow regularly with PCP  Essential hypertension Continue amlodipine  5 mg every day and Losartan  100 mg every day Continue DASH diet, limit saturated fats and simple carbs Monitor BP and if consistently >140/90 notify PCP If develops headaches, chest pain, shortness of breath or dizziness go to ER Loss of 10-15% body weight can help improve blood pressures  Continue to follow regularly with PCP  Metabolic dysfunction-associated steatotic liver disease (MASLD) Focus on limiting saturated fats and simple carbohydrates Loss of 10-15% of body weight can help improve hepatic steatosis  Continue to follow regularly with GI Dr Cindie  Pure hypercholesterolemia Focus on limiting saturated fats and simple carbs, increase lean protein Loss of 10-15% body weight can improve lipid levels Continue Rosuvastatin  10 mg every day and follow with PCP regularly  Class 2 severe obesity with serious comorbidity and body mass index (BMI) of 36.0 to 36.9 in adult, unspecified obesity type Obesity Treatment and Action Plan:  Patient will work on garnering support from family and friends to begin weight loss journey. Will work on eliminating or reducing the presence of highly palatable, calorie dense foods in the home. Will complete provided nutritional and psychosocial assessment questionnaire before the next appointment. Will be scheduled for indirect calorimetry to determine resting energy expenditure in a fasting state.  This will allow us  to create a reduced calorie, high-protein meal plan to promote loss of fat mass while preserving muscle mass. Counseled on the health benefits of losing 5%-15% of total body weight. Was counseled on nutritional approaches  to weight loss and benefits of reducing processed foods and consuming plant-based foods and high quality protein as part of nutritional weight management. Was counseled on pharmacotherapy and role as an adjunct in weight management.   Education and Additional resources  He was weighed on the bioimpedance scale and results were discussed and documented in the synopsis.  We discussed obesity as a  progressive, chronic disease and the importance of a more detailed evaluation of all the factors contributing to the disease.  We reviewed the basic principles in obesity management.   We discussed the importance of long term lifestyle changes which include nutrition, exercise and behavioral modification as well as the importance of customizing this to his specific health and social needs.  We reviewed the role of medical interventions including pharmacotherapy and surgical interventions.   We discussed the benefits of reaching a healthier weight to alleviate the symptoms of existing conditions and reduce the risks of the biomechanical, cardiometabolic and psychological effects of obesity.  We reviewed our program approach and philosophy, which are guided by the four pillars of obesity medicine.  We discussed how to prepare for intake appointment and the importance of fasting and avoidance of stimulants for at least 8 hours prior to indirect calorimetry.  Christopher Bowers appears to be in the action stage of change and reports being ready to initiate intensive lifestyle and behavioral modifications as part of their weight loss journey.  Attestation  Reviewed by clinician on day of visit: allergies, medications, problem list, medical history, surgical history, family history, social history, and previous encounter notes pertinent to obesity diagnosis.  I personally spent a total of 51 minutes in the care of the patient today including preparing to see the patient, getting/reviewing separately obtained  history, performing a medically appropriate exam/evaluation, counseling and educating, referring and communicating with other health care professionals, and documenting clinical information in the EHR.   Khalif Stender ANP-C

## 2024-05-12 ENCOUNTER — Other Ambulatory Visit (HOSPITAL_COMMUNITY): Payer: Self-pay

## 2024-06-05 ENCOUNTER — Ambulatory Visit (INDEPENDENT_AMBULATORY_CARE_PROVIDER_SITE_OTHER): Admitting: Nurse Practitioner

## 2024-06-05 ENCOUNTER — Other Ambulatory Visit (HOSPITAL_COMMUNITY): Payer: Self-pay

## 2024-06-05 ENCOUNTER — Encounter (INDEPENDENT_AMBULATORY_CARE_PROVIDER_SITE_OTHER): Payer: Self-pay | Admitting: Nurse Practitioner

## 2024-06-05 VITALS — BP 118/74 | HR 82 | Temp 97.9°F | Ht 70.0 in | Wt 255.0 lb

## 2024-06-05 DIAGNOSIS — K760 Fatty (change of) liver, not elsewhere classified: Secondary | ICD-10-CM

## 2024-06-05 DIAGNOSIS — R0602 Shortness of breath: Secondary | ICD-10-CM | POA: Diagnosis not present

## 2024-06-05 DIAGNOSIS — E66812 Obesity, class 2: Secondary | ICD-10-CM | POA: Diagnosis not present

## 2024-06-05 DIAGNOSIS — E785 Hyperlipidemia, unspecified: Secondary | ICD-10-CM | POA: Diagnosis not present

## 2024-06-05 DIAGNOSIS — E559 Vitamin D deficiency, unspecified: Secondary | ICD-10-CM | POA: Diagnosis not present

## 2024-06-05 DIAGNOSIS — Z6836 Body mass index (BMI) 36.0-36.9, adult: Secondary | ICD-10-CM | POA: Diagnosis not present

## 2024-06-05 DIAGNOSIS — E669 Obesity, unspecified: Secondary | ICD-10-CM

## 2024-06-05 DIAGNOSIS — R5383 Other fatigue: Secondary | ICD-10-CM

## 2024-06-05 DIAGNOSIS — E1169 Type 2 diabetes mellitus with other specified complication: Secondary | ICD-10-CM | POA: Diagnosis not present

## 2024-06-05 DIAGNOSIS — Z1331 Encounter for screening for depression: Secondary | ICD-10-CM | POA: Diagnosis not present

## 2024-06-05 DIAGNOSIS — I10 Essential (primary) hypertension: Secondary | ICD-10-CM

## 2024-06-05 DIAGNOSIS — Z7984 Long term (current) use of oral hypoglycemic drugs: Secondary | ICD-10-CM

## 2024-06-05 DIAGNOSIS — Z7985 Long-term (current) use of injectable non-insulin antidiabetic drugs: Secondary | ICD-10-CM | POA: Diagnosis not present

## 2024-06-05 NOTE — Progress Notes (Signed)
 " [address] [place],  [place], [place] [address]  Office: [phone]  /  Fax: [phone]   Subjective   Initial Visit  Christopher Bowers (MR# [id number]) is a 59 y.o. male who presents for evaluation and treatment of obesity and related comorbidities. Current BMI is Body mass index is 36.59 kg/m. Christopher Bowers has been struggling with his weight for many years and has been unsuccessful in either losing weight, maintaining weight loss, or reaching his healthy weight goal.  Christopher Bowers is currently in the action stage of change and ready to dedicate time achieving and maintaining a healthier weight. Christopher Bowers is interested in becoming our patient and working on intensive lifestyle modifications including (but not limited to) diet and exercise for weight loss.  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 Cindie- 03/19/2024: Fibrosure F3 fibrosis, S2-S3 steatosis.  Fib 4 score is 3.32 high risk. He has completely discontinued all alcohol use.  He does have Type 2 DM and is currently on Mounjaro  15 mg SQ QW and Metformin  500 mg BID.  He has been on Mounjaro  for approximately 1 year and has lost 15 pounds.  Lab Results  Component Value Date   HGBA1C 6.2 (H) 12/08/2023    He does have essential hypertension which is currently well controlled with amlodipine  5 mg every day and Losartan  100 mg every day. He denies headaches, chest pain, shortness of breath at rest and dizziness.  BP Readings from Last 3 Encounters:  06/05/24 118/74  05/02/24 106/71  05/02/24 116/79    He does have pure hypercholesterolemia and is currently on Crestor  10 mg every day.  Lab Results  Component Value Date   CHOL 153 03/27/2024   HDL 57 12/08/2023   LDLCALC 78 12/08/2023   TRIG 113 03/27/2024   CHOLHDL 2.6 12/08/2023    Weight history:  When asked how their weight has affected their life and health, he states: Has affected self-esteem, Contributed to medical problems, Contributed to orthopedic problems or mobility issues,  Having fatigue, Having poor endurance, and Has affected mood   When asked what else they would like to accomplish? He states: Adopt a healthier eating pattern and lifestyle, Improve energy levels and physical activity, Improve existing medical conditions, Improve quality of life, Improve appearance, Improve self-confidence, and Lose 35 lbs  He starting to note weight gain during : adulthood. Late 20's/early 30's  Other contributing factors: family history of obesity, disruption of circadian rhythm / sleep disordered breathing, reduced physical activity, chronic skipping of meals, strong orexigenic signaling and/or inadequate inhibitory control , enticing relationships and enviroment, multiple weight loss attempts in the past, and sedentary job.  Their highest weight has been:  272 lbs.  Desired weight: 220  Previous weight-loss programs : None, Low Carb, Ketogenic, Intermittent fasting, and High Protein.  Their maximum weight loss was:  15 lbs.  Their greatest challenge with dieting: difficulty maintaining reduced calorie state.  Current or previous pharmacotherapy: GLP-1, Metformin , and GLP-1 + GIP.  Response to medication: Lost weight and was able to maintain weight loss   Nutritional History:  Current nutrition plan: Low-carb and High-protein.  How many times do you eat outside the home: 2-4 per week  How often do they skip meals: skips lunch  What beverages do they drink: water , caffeinated beverages , protein shakes , and unsweetened tea.   Use of artificial sweetners : Yes  Food intolerances or dislikes: none.  Food triggers: Boredom.  Food cravings: Sugary, Starches / Carbohydrates, and Salty  Do they struggle with excessive hunger or portion control : No    Physical Activity:  Current level of physical activity: Step counting 3000-5000/day  Barriers to Exercise: no barriers   Past medical history includes:   Past Medical History:  Diagnosis Date   Alcohol  abuse    Anxiety    Asthma    Carpal tunnel syndrome of right wrist    Depression    Diabetes mellitus without complication (HCC)    GERD (gastroesophageal reflux disease)    High cholesterol    History of acute respiratory failure    Feb 2014  w/  CAP and ARDS  ---  resolved   History of exercise stress test    01-07-2010   -- negative for ischemia, no chest pain,  hypertensive response to exercise   History of gastritis    History of hiatal hernia    Hypertension    Joint pain    OSA (obstructive sleep apnea)    per pt study done 2013 OSA intolerant cpap  ,  pt uses oral appliance   Seizure (HCC) 11/27/2020   Sleep apnea    Swelling of lower extremity      Objective   BP 118/74   Pulse 82   Temp 97.9 F (36.6 C)   Ht 5' 10 (1.778 m)   Wt 255 lb (115.7 kg)   SpO2 93%   BMI 36.59 kg/m  He was weighed on the bioimpedance scale: Body mass index is 36.59 kg/m.    Anthropometrics:  Vitals Temp: 97.9 F (36.6 C) BP: 118/74 Pulse Rate: 82 SpO2: 93 %   Anthropometric Measurements Height: 5' 10 (1.778 m) Weight: 255 lb (115.7 kg) BMI (Calculated): 36.59 Weight at Last Visit: N/A Weight Lost Since Last Visit: N/A Weight Gained Since Last Visit: N/A Starting Weight: 255 lb Peak Weight: 272 lb Waist Measurement : 48 inches   Body Composition  Body Fat %: 33.6 % Fat Mass (lbs): 85.8 lbs Muscle Mass (lbs): 161 lbs Total Body Water  (lbs): 116.4 lbs Visceral Fat Rating : 20   Other Clinical Data Fasting: Yes Labs: Yes Today's Visit #: #1 Starting Date: 06/05/24 Comments: First Visit    Physical Exam:  General: He is overweight, cooperative, alert, well developed, and in no acute distress. PSYCH: Has normal mood, affect and thought process.   HEENT: EOMI, sclerae are anicteric. Lungs: Normal breathing effort, no conversational dyspnea. Extremities: No edema.  Neurologic: No gross sensory or motor deficits. No tremors or fasciculations noted.     Diagnostic Data Reviewed  EKG: Reviewed EKG from 03/27/24:Normal sinus rhythm, rate 67. LVH-No change from previous tracing 09/23/21  Indirect Calorimeter completed today shows a VO2 of 366 and a REE of 2534.  His calculated basal metabolic rate is 7691 thus his resting energy expenditure faster than calculated.  Depression Screen  Christopher Bowers's PHQ-9 score was: 0.     06/05/2024    7:43 AM  Depression screen PHQ 2/9  Decreased Interest 0  Down, Depressed, Hopeless 0  PHQ - 2 Score 0  Altered sleeping 0  Tired, decreased energy 0  Change in appetite 0  Feeling bad or failure about yourself  0  Trouble concentrating 0  Moving slowly or fidgety/restless 0  Suicidal thoughts 0  PHQ-9 Score 0  Difficult doing work/chores Not difficult at all    Screening for Sleep Related Breathing Disorders  Christopher Bowers admits to daytime somnolence and denies waking up still tired. Patient has a history of  symptoms of daytime fatigue and hypertension. Christopher Bowers generally gets 8 or 9 hours of sleep per night, and states that he has generally restful sleep. Snoring is not present with use of oral appliance. Apneic episodes are not present with oral appliance . Epworth Sleepiness Score is 11.   BMET    Component Value Date/Time   NA 140 03/27/2024 1448   K 4.4 03/27/2024 1448   CL 101 03/27/2024 1448   CO2 22 03/27/2024 1448   GLUCOSE 97 03/27/2024 1448   GLUCOSE 134 (H) 05/01/2015 1146   BUN 15 03/27/2024 1448   CREATININE 0.78 03/27/2024 1448   CREATININE 1.16 03/14/2013 1236   CALCIUM  9.5 03/27/2024 1448   GFRNONAA 72 07/04/2020 0821   GFRAA 83 07/04/2020 0821   Lab Results  Component Value Date   HGBA1C 6.2 (H) 12/08/2023   HGBA1C 4.4 02/02/2017   No results found for: INSULIN  CBC    Component Value Date/Time   WBC 8.5 12/08/2023 1232   WBC 10.9 (H) 03/22/2017 1710   RBC 4.77 12/08/2023 1232   RBC 5.20 03/22/2017 1710   HGB 14.8 12/08/2023 1232   HCT 45.4 12/08/2023 1232   PLT 144 (L)  12/08/2023 1232   MCV 95 12/08/2023 1232   MCH 31.0 12/08/2023 1232   MCH 31.0 09/26/2014 0740   MCHC 32.6 12/08/2023 1232   MCHC 33.6 03/22/2017 1710   RDW 13.2 12/08/2023 1232   Iron/TIBC/Ferritin/ %Sat    Component Value Date/Time   IRON 91 03/27/2024 1449   TIBC 382 03/27/2024 1449   FERRITIN 130 03/27/2024 1449   IRONPCTSAT 24 03/27/2024 1449   Lipid Panel     Component Value Date/Time   CHOL 153 03/27/2024 1449   TRIG 113 03/27/2024 1449   HDL 57 12/08/2023 1232   CHOLHDL 2.6 12/08/2023 1232   CHOLHDL 4.2 03/14/2013 1236   VLDL 31 03/14/2013 1236   LDLCALC 78 12/08/2023 1232   Hepatic Function Panel     Component Value Date/Time   PROT 6.7 03/27/2024 1448   ALBUMIN 4.6 03/27/2024 1448   AST 52 (H) 03/27/2024 1448   ALT 78 (H) 03/27/2024 1448   ALKPHOS 71 03/27/2024 1448   BILITOT 0.5 03/27/2024 1448   BILIDIR 0.21 05/07/2021 0818   IBILI 0.4 07/19/2012 0445      Component Value Date/Time   TSH 6.190 (H) 08/02/2013 0950     Assessment and Plan  Class 2 severe obesity with serious comorbidity and body mass index (BMI) of 36.0 to 36.9 in adult, unspecified obesity type  TREATMENT PLAN FOR OBESITY:  Recommended Dietary Goals  Christopher Bowers is currently in the action stage of change. As such, his goal is to implement medically supervised obesity management plan.  He has agreed to implement: the Category 4 plan - 1800 kcal per day  Behavioral Intervention  We discussed the following Behavioral Modification Strategies today: increasing lean protein intake to established goals, decreasing simple carbohydrates , increasing vegetables, increasing lower glycemic fruits, increasing fiber rich foods, avoiding skipping meals, increasing water  intake, work on meal planning and preparation, reading food labels , keeping healthy foods at home, identifying sources and decreasing liquid calories, decreasing eating out or consumption of processed foods, and making healthy choices  when eating convenient foods, planning for success, and better snacking choices  Additional resources provided today: Handout on healthy eating and balanced plate, Handout on complex carbohydrates and lean sources of protein, Category 4 packet, and Handout principles of weight management  Recommended Physical  Activity Goals  Christopher Bowers has been advised to work up to 150 minutes of moderate intensity aerobic activity a week and strengthening exercises 2-3 times per week for cardiovascular health, weight loss maintenance and preservation of muscle mass.   He has agreed to :  Think about enjoyable ways to increase daily physical activity and overcoming barriers to exercise and Increase physical activity in their day and reduce sedentary time (increase NEAT).  Medical Interventions and Pharmacotherapy We will work on building a therapist, art and behavioral strategies. We will discuss the role of pharmacotherapy as an adjunct at subsequent visits.   ASSOCIATED CONDITIONS ADDRESSED TODAY  Other Fatigue Ying does feel that his weight is causing his energy to be lower than it should be. Fatigue may be related to obesity, depression or many other causes. Labs will be ordered, and in the meanwhile, Christopher Bowers will focus on self care including making healthy food choices, increasing physical activity and focusing on stress reduction.  Shortness of Breath at rest Christopher Bowers notes increasing shortness of breath with physical activity and seems to be worsening over time with weight gain. He notes getting out of breath sooner with activity than he used to. This has not gotten worse recently. Christopher Bowers denies shortness of breath at rest or orthopnea.  Type 2 diabetes mellitus in patient with obesity (HCC) Start Category 4  meal plan, limit simple carbohydrates Decreasing body weight by 10-15% can improve glucose levels Continue to follow routinely with PCP  -     Insulin , random -     Hemoglobin  A1c  Hyperlipidemia associated with type 2 diabetes mellitus (HCC) Focus on implementing category 4 meal plan, limit saturated fats Loss of 10-15% body weight can improve lipid levels Focus on getting 150 minutes a week of moderate to high intensity exercise   Depression screening      Scored a 0 on PHQ 9 - no further intervention needed  Metabolic dysfunction-associated steatotic liver disease (MASLD) Continue to follow routinely with Dr. Cindie Start Category 4 meal plan and focus on limiting saturated fats and simple carbohydrates Loss of 10-15% of body weight can help improve hepatic steatosis  -     CBC with Differential/Platelet  Essential hypertension Continue amlodipine  5 mg every day and Losartan  100 mg  every day Start Category 4 meal plan  and DASH diet Monitor BP and if consistently >140/90 notify PCP If develops headaches, chest pain, shortness of breath or dizziness go to ER Loss of 10-15% body weight can help improve blood pressures  -     CBC with Differential/Platelet -     Basic metabolic panel with GFR  Class 2 severe obesity with serious comorbidity and body mass index (BMI) of 36.0 to 36.9 in adult, unspecified obesity type See plan above -     CBC with Differential/Platelet -     Basic metabolic panel with GFR -     Insulin , random -     Magnesium  -     TSH -     Vitamin B12 -     VITAMIN D  25 Hydroxy (Vit-D Deficiency, Fractures) -     Hemoglobin A1c  Vitamin D  deficiency If Vit D level remains low will supplement with Ergocalciferol  50000 units once a week for 12 weeks and then recheck level.   -     VITAMIN D  25 Hydroxy (Vit-D Deficiency, Fractures)    Follow-up  He was informed of the importance of frequent follow-up visits to maximize his  success with intensive lifestyle modifications for his multiple health conditions. He was informed we would discuss his lab results at his next visit unless there is a critical issue that needs to be addressed  sooner. Christopher Bowers agreed to keep his next visit at the agreed upon time to discuss these results.  Attestation Statement  This is the patient's intake visit at [facility]. The patient's Health Questionnaire was reviewed at length. Included in the packet: current and past health history, medications, allergies, ROS, gynecologic history (women only), surgical history, family history, social history, weight history, weight loss surgery history (for those that have had weight loss surgery), nutritional evaluation, mood and food questionnaire, PHQ9, Epworth questionnaire, sleep habits questionnaire, patient life and health improvement goals questionnaire. These will all be scanned into the patient's chart under media.   During the visit, I independently reviewed the patient's EKG done 03/27/24 , previous labs, bioimpedance scale results, and indirect calorimetry results. I used this information to medically tailor a meal plan for the patient that will help him to lose weight and will improve his obesity-related conditions. I performed a medically necessary appropriate examination and/or evaluation. I discussed the assessment and treatment plan with the patient. The patient was provided an opportunity to ask questions and all were answered. The patient agreed with the plan and demonstrated an understanding of the instructions. Labs were ordered at this visit and will be reviewed at the next visit unless critical results need to be addressed immediately. Clinical information was updated and documented in the EMR.   In addition, they received basic education on identification of processed foods and reduction of these, different sources of lean proteins and complex carbohydrates and how to eat balanced by incorporation of whole foods.  Reviewed by clinician on day of visit: allergies, medications, problem list, medical history, surgical history, family history, social history, and previous encounter  notes.  I personally spent a total of 42 minutes in the care of the patient today including preparing to see the patient, getting/reviewing separately obtained history, performing a medically appropriate exam/evaluation, counseling and educating, placing orders, documenting clinical information in the EHR, and independently interpreting results. Does not include time for depression screen or indirect calorimetry     Lonell Liverpool ANP-C "

## 2024-06-06 LAB — CBC WITH DIFFERENTIAL/PLATELET
Basophils Absolute: 0.1 x10E3/uL (ref 0.0–0.2)
Basos: 1 %
EOS (ABSOLUTE): 0.2 x10E3/uL (ref 0.0–0.4)
Eos: 3 %
Hematocrit: 45.5 % (ref 37.5–51.0)
Hemoglobin: 15.3 g/dL (ref 13.0–17.7)
Immature Grans (Abs): 0 x10E3/uL (ref 0.0–0.1)
Immature Granulocytes: 0 %
Lymphocytes Absolute: 2.6 x10E3/uL (ref 0.7–3.1)
Lymphs: 30 %
MCH: 30.4 pg (ref 26.6–33.0)
MCHC: 33.6 g/dL (ref 31.5–35.7)
MCV: 90 fL (ref 79–97)
Monocytes Absolute: 0.9 x10E3/uL (ref 0.1–0.9)
Monocytes: 10 %
Neutrophils Absolute: 4.8 x10E3/uL (ref 1.4–7.0)
Neutrophils: 56 %
Platelets: 174 x10E3/uL (ref 150–450)
RBC: 5.04 x10E6/uL (ref 4.14–5.80)
RDW: 12.3 % (ref 11.6–15.4)
WBC: 8.7 x10E3/uL (ref 3.4–10.8)

## 2024-06-06 LAB — INSULIN, RANDOM: INSULIN: 39.4 u[IU]/mL — ABNORMAL HIGH (ref 2.6–24.9)

## 2024-06-06 LAB — BASIC METABOLIC PANEL WITH GFR
BUN/Creatinine Ratio: 16 (ref 9–20)
BUN: 15 mg/dL (ref 6–24)
CO2: 22 mmol/L (ref 20–29)
Calcium: 9.8 mg/dL (ref 8.7–10.2)
Chloride: 103 mmol/L (ref 96–106)
Creatinine, Ser: 0.95 mg/dL (ref 0.76–1.27)
Glucose: 111 mg/dL — ABNORMAL HIGH (ref 70–99)
Potassium: 5 mmol/L (ref 3.5–5.2)
Sodium: 141 mmol/L (ref 134–144)
eGFR: 93 mL/min/1.73

## 2024-06-06 LAB — VITAMIN B12: Vitamin B-12: 576 pg/mL (ref 232–1245)

## 2024-06-06 LAB — HEMOGLOBIN A1C
Est. average glucose Bld gHb Est-mCnc: 134 mg/dL
Hgb A1c MFr Bld: 6.3 % — ABNORMAL HIGH (ref 4.8–5.6)

## 2024-06-06 LAB — TSH: TSH: 4.67 u[IU]/mL — ABNORMAL HIGH (ref 0.450–4.500)

## 2024-06-06 LAB — VITAMIN D 25 HYDROXY (VIT D DEFICIENCY, FRACTURES): Vit D, 25-Hydroxy: 35 ng/mL (ref 30.0–100.0)

## 2024-06-06 LAB — MAGNESIUM: Magnesium: 2 mg/dL (ref 1.6–2.3)

## 2024-06-07 ENCOUNTER — Ambulatory Visit (INDEPENDENT_AMBULATORY_CARE_PROVIDER_SITE_OTHER): Payer: Self-pay | Admitting: Nurse Practitioner

## 2024-06-11 ENCOUNTER — Other Ambulatory Visit: Payer: Self-pay

## 2024-06-15 ENCOUNTER — Ambulatory Visit: Admitting: Family Medicine

## 2024-06-19 ENCOUNTER — Ambulatory Visit (INDEPENDENT_AMBULATORY_CARE_PROVIDER_SITE_OTHER): Admitting: Nurse Practitioner

## 2024-07-05 ENCOUNTER — Encounter: Payer: Self-pay | Admitting: Internal Medicine

## 2024-07-25 ENCOUNTER — Ambulatory Visit: Admitting: Cardiology

## 2024-08-02 ENCOUNTER — Ambulatory Visit: Admitting: Family Medicine
